# Patient Record
Sex: Male | Born: 1940
Health system: Southern US, Community
[De-identification: ages and names within clinical notes are randomized; demographics above are authoritative.]

## PROBLEM LIST (undated history)

## (undated) DIAGNOSIS — E119 Type 2 diabetes mellitus without complications: Secondary | ICD-10-CM

## (undated) DIAGNOSIS — M1990 Unspecified osteoarthritis, unspecified site: Secondary | ICD-10-CM

## (undated) DIAGNOSIS — E785 Hyperlipidemia, unspecified: Secondary | ICD-10-CM

## (undated) DIAGNOSIS — I10 Essential (primary) hypertension: Secondary | ICD-10-CM

## (undated) DIAGNOSIS — Z86010 Personal history of colonic polyps: Secondary | ICD-10-CM

## (undated) HISTORY — DX: Personal history of colonic polyps: Z86.010

## (undated) HISTORY — PX: COLONOSCOPY: SHX174

## (undated) HISTORY — DX: Type 2 diabetes mellitus without complications: E11.9

## (undated) HISTORY — PX: TRIGGER FINGER RELEASE: SHX641

## (undated) HISTORY — PX: HAND SURGERY: SHX662

## (undated) HISTORY — DX: Hyperlipidemia, unspecified: E78.5

## (undated) HISTORY — DX: Essential (primary) hypertension: I10

## (undated) HISTORY — PX: KNEE ARTHROSCOPY: SUR90

---

## 2006-03-17 ENCOUNTER — Ambulatory Visit: Payer: Self-pay | Admitting: Family Medicine

## 2007-07-20 ENCOUNTER — Ambulatory Visit: Payer: Self-pay | Admitting: Family Medicine

## 2007-07-23 ENCOUNTER — Ambulatory Visit: Payer: Self-pay | Admitting: Internal Medicine

## 2007-07-30 ENCOUNTER — Ambulatory Visit: Payer: Self-pay | Admitting: Internal Medicine

## 2007-07-30 LAB — HM COLONOSCOPY: HM COLON: NORMAL

## 2008-08-21 ENCOUNTER — Ambulatory Visit: Payer: Self-pay | Admitting: Family Medicine

## 2010-04-14 ENCOUNTER — Ambulatory Visit: Payer: Self-pay | Admitting: Family Medicine

## 2010-07-26 ENCOUNTER — Ambulatory Visit: Payer: Self-pay | Admitting: Family Medicine

## 2011-06-04 ENCOUNTER — Other Ambulatory Visit: Payer: Self-pay | Admitting: Family Medicine

## 2011-06-06 NOTE — Telephone Encounter (Signed)
Patient needs a office visit. 

## 2011-08-29 ENCOUNTER — Ambulatory Visit (INDEPENDENT_AMBULATORY_CARE_PROVIDER_SITE_OTHER): Payer: Medicare Other | Admitting: Family Medicine

## 2011-08-29 ENCOUNTER — Encounter: Payer: Self-pay | Admitting: Family Medicine

## 2011-08-29 VITALS — BP 130/90 | HR 60 | Wt 191.0 lb

## 2011-08-29 DIAGNOSIS — B351 Tinea unguium: Secondary | ICD-10-CM

## 2011-08-29 NOTE — Progress Notes (Signed)
  Subjective:    Patient ID: Scott Parks, male    DOB: 25-Nov-1940, 71 y.o.   MRN: [id number]  HPI He is here to get his last vaccine injection. He also has concerns over return of onychomycosis. His right great toe is starting to become discolored.   Review of Systems     Objective:   Physical Exam Alert and in no distress. Exam of the right great toe does show some distal pigment changes.       Assessment & Plan:  Medication update. Early onychomycosis. I will give him a prescription to get Zostavax at [facility] pharmacy. Also discussed treatment with Lamisil and recommended postponing this which he is in agreement with.

## 2011-11-08 ENCOUNTER — Encounter: Payer: Self-pay | Admitting: Family Medicine

## 2011-11-08 ENCOUNTER — Ambulatory Visit (INDEPENDENT_AMBULATORY_CARE_PROVIDER_SITE_OTHER): Payer: Medicare Other | Admitting: Family Medicine

## 2011-11-08 VITALS — BP 140/90 | HR 69 | Ht 69.0 in | Wt 194.0 lb

## 2011-11-08 DIAGNOSIS — Z79899 Other long term (current) drug therapy: Secondary | ICD-10-CM | POA: Diagnosis not present

## 2011-11-08 DIAGNOSIS — E1159 Type 2 diabetes mellitus with other circulatory complications: Secondary | ICD-10-CM | POA: Insufficient documentation

## 2011-11-08 DIAGNOSIS — E785 Hyperlipidemia, unspecified: Secondary | ICD-10-CM

## 2011-11-08 DIAGNOSIS — R972 Elevated prostate specific antigen [PSA]: Secondary | ICD-10-CM | POA: Insufficient documentation

## 2011-11-08 DIAGNOSIS — I10 Essential (primary) hypertension: Secondary | ICD-10-CM | POA: Diagnosis not present

## 2011-11-08 LAB — COMPREHENSIVE METABOLIC PANEL
Alkaline Phosphatase: 114 U/L (ref 39–117)
BUN: 16 mg/dL (ref 6–23)
CO2: 31 mEq/L (ref 19–32)
Creat: 1.07 mg/dL (ref 0.50–1.35)
Glucose, Bld: 102 mg/dL — ABNORMAL HIGH (ref 70–99)
Sodium: 139 mEq/L (ref 135–145)
Total Bilirubin: 0.6 mg/dL (ref 0.3–1.2)
Total Protein: 6.7 g/dL (ref 6.0–8.3)

## 2011-11-08 LAB — LIPID PANEL
Cholesterol: 176 mg/dL (ref 0–200)
HDL: 63 mg/dL (ref >39)
Triglycerides: 58 mg/dL (ref <150)
VLDL: 12 mg/dL (ref 0–40)

## 2011-11-08 LAB — CBC WITH DIFFERENTIAL/PLATELET
Basophils Relative: 0 % (ref 0–1)
Eosinophils Absolute: 0.3 10*3/uL (ref 0.0–0.7)
Eosinophils Relative: 4 % (ref 0–5)
HCT: 46.8 % (ref 39.0–52.0)
Hemoglobin: 15.7 g/dL (ref 13.0–17.0)
Lymphs Abs: 2 10*3/uL (ref 0.7–4.0)
MCH: 27.8 pg (ref 26.0–34.0)
MCHC: 33.5 g/dL (ref 30.0–36.0)
MCV: 82.8 fL (ref 78.0–100.0)
Monocytes Absolute: 0.5 10*3/uL (ref 0.1–1.0)
Monocytes Relative: 7 % (ref 3–12)
RBC: 5.65 MIL/uL (ref 4.22–5.81)

## 2011-11-08 NOTE — Progress Notes (Signed)
  Subjective:    Patient ID: Scott Parks, male    DOB: [birth year], 71 y.o.   MRN: [id number]  HPI He is here for routine checkup. He does have a previous history of elevated PSA. His last testing was in 2008. Last PSA was in the 6 range. He has no particular concerns or complaints. He continues on Lipitor and Accupril as well as omega-3.  Review of Systems     Objective:   Physical Exam alert and in no distress. Tympanic membranes and canals are normal. Throat is clear. Tonsils are normal. Neck is supple without adenopathy or thyromegaly. Cardiac exam shows a regular sinus rhythm without murmurs or gallops. Lungs are clear to auscultation.        Assessment & Plan:   1. Hypertension  CBC with Differential, Comprehensive metabolic panel  2. Elevated PSA  PSA  3. Hyperlipidemia  Lipid panel  4. Encounter for long-term (current) use of other medications  PSA, CBC with Differential, Comprehensive metabolic panel, Lipid panel

## 2011-11-08 NOTE — Patient Instructions (Signed)
Keep taking good care of yourself 

## 2011-11-09 ENCOUNTER — Other Ambulatory Visit: Payer: Self-pay

## 2011-11-09 MED ORDER — ATORVASTATIN CALCIUM 40 MG PO TABS: 40.0000 mg | ORAL_TABLET | Freq: Every day | ORAL | Status: DC

## 2011-11-09 MED ORDER — QUINAPRIL HCL 40 MG PO TABS: 40.0000 mg | ORAL_TABLET | Freq: Every day | ORAL | Status: DC

## 2011-11-09 NOTE — Telephone Encounter (Signed)
Sent med in per pt 

## 2011-11-10 ENCOUNTER — Telehealth: Payer: Self-pay | Admitting: Internal Medicine

## 2011-11-10 MED ORDER — ATORVASTATIN CALCIUM 40 MG PO TABS: 40.0000 mg | ORAL_TABLET | Freq: Every day | ORAL | Status: DC

## 2011-11-10 MED ORDER — QUINAPRIL HCL 40 MG PO TABS: 40.0000 mg | ORAL_TABLET | Freq: Every day | ORAL | Status: DC

## 2011-11-10 NOTE — Telephone Encounter (Signed)
#  30 with 5 was done for meds atorvastatin 40mg  and quinapril by shane on 11/09/11. Got a fax for it to be a 90 day supply for [facility] so i changed both meds to #90 with 1 refill to equal what shane put in yesterday as a refill.

## 2011-12-19 ENCOUNTER — Ambulatory Visit (INDEPENDENT_AMBULATORY_CARE_PROVIDER_SITE_OTHER): Payer: Medicare Other | Admitting: Family Medicine

## 2011-12-19 ENCOUNTER — Encounter: Payer: Self-pay | Admitting: Family Medicine

## 2011-12-19 VITALS — BP 130/88 | HR 65 | Temp 97.6°F | Wt 199.0 lb

## 2011-12-19 DIAGNOSIS — H6690 Otitis media, unspecified, unspecified ear: Secondary | ICD-10-CM

## 2011-12-19 DIAGNOSIS — H6691 Otitis media, unspecified, right ear: Secondary | ICD-10-CM

## 2011-12-19 MED ORDER — AMOXICILLIN 875 MG PO TABS: 875.0000 mg | ORAL_TABLET | Freq: Two times a day (BID) | ORAL | Status: AC

## 2011-12-19 NOTE — Progress Notes (Signed)
  Subjective:    Patient ID: Scott Parks, male    DOB: November 20, 1940, 71 y.o.   MRN: [id number]  HPI He has a 9 day history of sore throat and right earache. No fever or chills, nasal congestion, PND , he does have nighttime coughing in particular. He has no allergies and does not smoke Review of Systems     Objective:   Physical Exam alert and in no distress. Tympanic membrane on the right is dull and vascular, left is normal, canals are normal. Throat is clear. Tonsils are normal. Neck is supple without adenopathy or thyromegaly. Cardiac exam shows a regular sinus rhythm without murmurs or gallops. Lungs are clear to auscultation.       Assessment & Plan:   1. Acute right otitis media  amoxicillin (AMOXIL) 875 MG tablet

## 2012-07-18 DIAGNOSIS — Z23 Encounter for immunization: Secondary | ICD-10-CM | POA: Diagnosis not present

## 2012-08-22 ENCOUNTER — Telehealth: Payer: Self-pay | Admitting: Internal Medicine

## 2012-08-22 MED ORDER — QUINAPRIL HCL 40 MG PO TABS: 40.0000 mg | ORAL_TABLET | Freq: Every day | ORAL | Status: DC

## 2012-08-22 NOTE — Telephone Encounter (Signed)
Pt has one day left on his accuapril and pt is coming in on Tuesday January 14 for a med check and needed a refill sent to pharmacy. i sent in 30 day supply to pharmacy so pt wont run out

## 2012-08-28 ENCOUNTER — Encounter: Payer: Self-pay | Admitting: Family Medicine

## 2012-08-28 ENCOUNTER — Other Ambulatory Visit: Payer: Self-pay | Admitting: Family Medicine

## 2012-08-28 ENCOUNTER — Ambulatory Visit (INDEPENDENT_AMBULATORY_CARE_PROVIDER_SITE_OTHER): Payer: Medicare Other | Admitting: Family Medicine

## 2012-08-28 VITALS — BP 120/74 | HR 64 | Wt 191.0 lb

## 2012-08-28 DIAGNOSIS — N529 Male erectile dysfunction, unspecified: Secondary | ICD-10-CM | POA: Diagnosis not present

## 2012-08-28 DIAGNOSIS — Z1211 Encounter for screening for malignant neoplasm of colon: Secondary | ICD-10-CM

## 2012-08-28 DIAGNOSIS — I10 Essential (primary) hypertension: Secondary | ICD-10-CM | POA: Diagnosis not present

## 2012-08-28 DIAGNOSIS — Z79899 Other long term (current) drug therapy: Secondary | ICD-10-CM | POA: Diagnosis not present

## 2012-08-28 DIAGNOSIS — E785 Hyperlipidemia, unspecified: Secondary | ICD-10-CM | POA: Diagnosis not present

## 2012-08-28 DIAGNOSIS — R972 Elevated prostate specific antigen [PSA]: Secondary | ICD-10-CM

## 2012-08-28 LAB — HEMOCCULT GUIAC POC 1CARD (OFFICE)

## 2012-08-28 LAB — CBC WITH DIFFERENTIAL/PLATELET
Basophils Absolute: 0 10*3/uL (ref 0.0–0.1)
Basophils Relative: 0 % (ref 0–1)
Eosinophils Absolute: 0.2 10*3/uL (ref 0.0–0.7)
HCT: 43.2 % (ref 39.0–52.0)
Hemoglobin: 14.7 g/dL (ref 13.0–17.0)
MCH: 27.6 pg (ref 26.0–34.0)
MCHC: 34 g/dL (ref 30.0–36.0)
Monocytes Absolute: 0.3 10*3/uL (ref 0.1–1.0)
Monocytes Relative: 5 % (ref 3–12)
RDW: 14 % (ref 11.5–15.5)

## 2012-08-28 MED ORDER — QUINAPRIL HCL 40 MG PO TABS: 40.0000 mg | ORAL_TABLET | Freq: Every day | ORAL | Status: DC

## 2012-08-28 MED ORDER — TADALAFIL 20 MG PO TABS: 20.0000 mg | ORAL_TABLET | Freq: Every day | ORAL | Status: DC | PRN

## 2012-08-28 MED ORDER — ATORVASTATIN CALCIUM 40 MG PO TABS: 40.0000 mg | ORAL_TABLET | Freq: Every day | ORAL | Status: DC

## 2012-08-28 NOTE — Progress Notes (Signed)
  Subjective:    Patient ID: Scott Parks, male    DOB: Dec 01, 1940, 72 y.o.   MRN: [id number]  HPI He is here for an interval evaluation. He does have a previous history of elevated PSA with previously negative biopsies. His PSA has been stable. He will need followup on this. He continues on his blood pressure medication and is having no difficulty with this. He also takes Lipitor. He has found that all the ED medications essentially work the same. His immunizations were reviewed and are up to date. He has no other concerns or complaints.   Review of Systems     Objective:   Physical Exam alert and in no distress. Tympanic membranes and canals are normal. Throat is clear. Tonsils are normal. Neck is supple without adenopathy or thyromegaly. Cardiac exam shows a regular sinus rhythm without murmurs or gallops. Lungs are clear to auscultation. Rectal exam shows a slightly large prostate with no nodules. Stool is guaiac negative       Assessment & Plan:   1. Elevated PSA  PSA Total+%Free (Serial)  2. Hypertension  quinapril (ACCUPRIL) 40 MG tablet, CBC with Differential, Comprehensive metabolic panel  3. Hyperlipidemia  atorvastatin (LIPITOR) 40 MG tablet, Lipid panel  4. ED (erectile dysfunction)  tadalafil (CIALIS) 20 MG tablet  5. Encounter for long-term (current) use of other medications  Lipid panel, CBC with Differential, Comprehensive metabolic panel  6. Screening for colon cancer  Hemoccult - 1 Card (office)

## 2012-08-29 LAB — LIPID PANEL
Cholesterol: 165 mg/dL (ref 0–200)
HDL: 59 mg/dL (ref >39)
Triglycerides: 71 mg/dL (ref <150)
VLDL: 14 mg/dL (ref 0–40)

## 2012-08-29 LAB — COMPREHENSIVE METABOLIC PANEL
Alkaline Phosphatase: 98 U/L (ref 39–117)
BUN: 18 mg/dL (ref 6–23)
Creat: 1.16 mg/dL (ref 0.50–1.35)
Glucose, Bld: 122 mg/dL — ABNORMAL HIGH (ref 70–99)
Total Bilirubin: 0.7 mg/dL (ref 0.3–1.2)

## 2012-09-26 DIAGNOSIS — R972 Elevated prostate specific antigen [PSA]: Secondary | ICD-10-CM | POA: Diagnosis not present

## 2012-09-26 DIAGNOSIS — N400 Enlarged prostate without lower urinary tract symptoms: Secondary | ICD-10-CM | POA: Diagnosis not present

## 2013-10-02 DIAGNOSIS — N400 Enlarged prostate without lower urinary tract symptoms: Secondary | ICD-10-CM | POA: Diagnosis not present

## 2013-10-02 DIAGNOSIS — R972 Elevated prostate specific antigen [PSA]: Secondary | ICD-10-CM | POA: Diagnosis not present

## 2013-10-02 DIAGNOSIS — N529 Male erectile dysfunction, unspecified: Secondary | ICD-10-CM | POA: Diagnosis not present

## 2013-10-22 ENCOUNTER — Ambulatory Visit (INDEPENDENT_AMBULATORY_CARE_PROVIDER_SITE_OTHER): Payer: Medicare Other | Admitting: Family Medicine

## 2013-10-22 ENCOUNTER — Encounter: Payer: Self-pay | Admitting: Family Medicine

## 2013-10-22 ENCOUNTER — Other Ambulatory Visit: Payer: Self-pay | Admitting: Family Medicine

## 2013-10-22 VITALS — BP 140/72 | HR 60 | Ht 69.0 in | Wt 186.0 lb

## 2013-10-22 DIAGNOSIS — R972 Elevated prostate specific antigen [PSA]: Secondary | ICD-10-CM

## 2013-10-22 DIAGNOSIS — I10 Essential (primary) hypertension: Secondary | ICD-10-CM

## 2013-10-22 DIAGNOSIS — E785 Hyperlipidemia, unspecified: Secondary | ICD-10-CM | POA: Diagnosis not present

## 2013-10-22 DIAGNOSIS — Z79899 Other long term (current) drug therapy: Secondary | ICD-10-CM

## 2013-10-22 DIAGNOSIS — Z125 Encounter for screening for malignant neoplasm of prostate: Secondary | ICD-10-CM | POA: Diagnosis not present

## 2013-10-22 LAB — COMPREHENSIVE METABOLIC PANEL
ALBUMIN: 4.2 g/dL (ref 3.5–5.2)
ALK PHOS: 102 U/L (ref 39–117)
ALT: 17 U/L (ref 0–53)
AST: 16 U/L (ref 0–37)
BILIRUBIN TOTAL: 0.6 mg/dL (ref 0.2–1.2)
BUN: 18 mg/dL (ref 6–23)
CO2: 31 mEq/L (ref 19–32)
Calcium: 9.7 mg/dL (ref 8.4–10.5)
Chloride: 102 mEq/L (ref 96–112)
Creat: 1.16 mg/dL (ref 0.50–1.35)
Glucose, Bld: 141 mg/dL — ABNORMAL HIGH (ref 70–99)
POTASSIUM: 4.6 meq/L (ref 3.5–5.3)
SODIUM: 138 meq/L (ref 135–145)
TOTAL PROTEIN: 6.9 g/dL (ref 6.0–8.3)

## 2013-10-22 LAB — CBC WITH DIFFERENTIAL/PLATELET
BASOS ABS: 0 10*3/uL (ref 0.0–0.1)
BASOS PCT: 0 % (ref 0–1)
Eosinophils Absolute: 0.3 10*3/uL (ref 0.0–0.7)
Eosinophils Relative: 4 % (ref 0–5)
HCT: 43.2 % (ref 39.0–52.0)
HEMOGLOBIN: 15 g/dL (ref 13.0–17.0)
Lymphocytes Relative: 29 % (ref 12–46)
Lymphs Abs: 2 10*3/uL (ref 0.7–4.0)
MCH: 27.8 pg (ref 26.0–34.0)
MCHC: 34.7 g/dL (ref 30.0–36.0)
MCV: 80.1 fL (ref 78.0–100.0)
Monocytes Absolute: 0.5 10*3/uL (ref 0.1–1.0)
Monocytes Relative: 7 % (ref 3–12)
NEUTROS ABS: 4.1 10*3/uL (ref 1.7–7.7)
NEUTROS PCT: 60 % (ref 43–77)
PLATELETS: 181 10*3/uL (ref 150–400)
RBC: 5.39 MIL/uL (ref 4.22–5.81)
RDW: 15.3 % (ref 11.5–15.5)
WBC: 6.9 10*3/uL (ref 4.0–10.5)

## 2013-10-22 LAB — LIPID PANEL
Cholesterol: 179 mg/dL (ref 0–200)
HDL: 65 mg/dL (ref >39)
LDL CALC: 100 mg/dL — AB (ref 0–99)
Total CHOL/HDL Ratio: 2.8 Ratio
Triglycerides: 72 mg/dL (ref <150)
VLDL: 14 mg/dL (ref 0–40)

## 2013-10-22 MED ORDER — QUINAPRIL HCL 40 MG PO TABS
40.0000 mg | ORAL_TABLET | Freq: Every day | ORAL | Status: DC
Start: 2013-10-22 — End: 2014-12-17

## 2013-10-22 MED ORDER — ATORVASTATIN CALCIUM 40 MG PO TABS
40.0000 mg | ORAL_TABLET | Freq: Every day | ORAL | Status: DC
Start: 2013-10-22 — End: 2014-12-17

## 2013-10-22 NOTE — Progress Notes (Signed)
   Subjective:    Patient ID: Scott Parks, male    DOB: 1940/11/11, 73 y.o.   MRN: [id number]  HPI He is here for a medication check. He does have a long history of elevated PSA and has been followed serially with PSA and total. He also is on Lipitor and doing well on this. He continues on his Accupril and is having no difficulty. He exercises regularly. He does note that he has some hip discomfort however exercise makes the pain go away. Social history was reviewed; he does not smoke and rarely drinks. His marriage is going well. He has been retired for 6 months and enjoying this. He does Psychologist, occupational with various organizations. He had a colonoscopy 5 years ago. His immunizations were reviewed and are apparently normal  Review of Systems  All other systems reviewed and are negative.       Objective:   Physical Exam BP 140/72  Pulse 60  Ht 5\' 9"  (1.753 m)  Wt 186 lb (84.369 kg)  BMI 27.45 kg/m2  General Appearance:    Alert, cooperative, no distress, appears stated age  Head:    Normocephalic, without obvious abnormality, atraumatic  Eyes:    PERRL, conjunctiva/corneas clear, EOM's intact, fundi    benign  Ears:    Normal TM's and external ear canals  Nose:   Nares normal, mucosa normal, no drainage or sinus   tenderness  Throat:   Lips, mucosa, and tongue normal; teeth and gums normal  Neck:   Supple, no lymphadenopathy;  thyroid:  no   enlargement/tenderness/nodules; no carotid   bruit or JVD  Back:    Spine nontender, no curvature, ROM normal, no CVA     tenderness  Lungs:     Clear to auscultation bilaterally without wheezes, rales or     ronchi; respirations unlabored  Chest Wall:    No tenderness or deformity   Heart:    Regular rate and rhythm, S1 and S2 normal, no murmur, rub   or gallop  Breast Exam:    No chest wall tenderness, masses or gynecomastia  Abdomen:     Soft, non-tender, nondistended, normoactive bowel sounds,    no masses, no hepatosplenomegaly          Extremities:   No clubbing, cyanosis or edema  Pulses:   2+ and symmetric all extremities  Skin:   Skin color, texture, turgor normal, no rashes or lesions  Lymph nodes:   Cervical, supraclavicular, and axillary nodes normal  Neurologic:   CNII-XII intact, normal strength, sensation and gait; reflexes 2+ and symmetric throughout          Psych:   Normal mood, affect, hygiene and grooming.            Assessment & Plan:  Elevated PSA - Plan: PSA, total and free  Hyperlipidemia - Plan: Lipid panel, atorvastatin (LIPITOR) 40 MG tablet  Hypertension - Plan: CBC with Differential, Comprehensive metabolic panel, quinapril (ACCUPRIL) 40 MG tablet  Encounter for long-term (current) use of other medications - Plan: CBC with Differential, Comprehensive metabolic panel, Lipid panel, PSA, total and free  Special screening for malignant neoplasm of prostate - Plan: PSA, total and free  urged him to continue to take good care of himself.

## 2013-10-23 LAB — HEMOGLOBIN A1C
Hgb A1c MFr Bld: 7.3 % — ABNORMAL HIGH (ref <5.7)
Mean Plasma Glucose: 163 mg/dL — ABNORMAL HIGH (ref <117)

## 2013-10-23 LAB — PSA, TOTAL AND FREE
PSA FREE PCT: 25 % (ref >25)
PSA, Free: 2.05 ng/mL
PSA: 8.26 ng/mL — AB (ref <=4.00)

## 2013-10-30 ENCOUNTER — Ambulatory Visit (INDEPENDENT_AMBULATORY_CARE_PROVIDER_SITE_OTHER): Payer: Medicare Other | Admitting: Family Medicine

## 2013-10-30 ENCOUNTER — Encounter: Payer: Self-pay | Admitting: Family Medicine

## 2013-10-30 VITALS — BP 110/72 | HR 64 | Wt 186.0 lb

## 2013-10-30 DIAGNOSIS — E119 Type 2 diabetes mellitus without complications: Secondary | ICD-10-CM

## 2013-10-30 NOTE — Progress Notes (Signed)
   Subjective:    Patient ID: Scott Parks, male    DOB: 1941/05/10, 73 y.o.   MRN: [id number]  HPI He is here for consult concerning recent diagnosis of diabetes. Recently an A1c was done which was 7.3.  Review of Systems     Objective:   Physical Exam Alert and in no distress otherwise not examined       Assessment & Plan:  Diabetes mellitus, new onset - Plan: Amb Referral to Nutrition and Diabetic [facility]  I discussed the diagnosis of diabetes in regard to spectrum of disease. Discussed diet, exercise, foot care, eye care, blood pressure and cholesterol. His wife was recently diagnosed with diabetes. He was given information concerning going to the American diabetes association website or [email or web address] website for more information. I will check him back here in one month. He was also given a glucometer with instructions on proper use. Over 30 minutes spent discussing all these issues with him.

## 2013-10-30 NOTE — Patient Instructions (Signed)
Go to the American diabetes association website or [email or web address] 

## 2013-11-01 ENCOUNTER — Other Ambulatory Visit: Payer: Self-pay | Admitting: Family Medicine

## 2013-11-01 ENCOUNTER — Telehealth: Payer: Self-pay | Admitting: Family Medicine

## 2013-11-01 MED ORDER — GLUCOSE BLOOD VI STRP: ORAL_STRIP | Status: DC

## 2013-11-01 NOTE — Telephone Encounter (Signed)
RX REFILLS SENT TO HIS PHARMACY ON HIS TEST STRIPS AND LANCETS.Marland Kitchen CLS

## 2013-11-06 ENCOUNTER — Telehealth: Payer: Self-pay | Admitting: Internal Medicine

## 2013-11-06 MED ORDER — GLUCOSE BLOOD VI STRP: ORAL_STRIP | Status: DC

## 2013-11-06 NOTE — Telephone Encounter (Signed)
Pt called and said test strips and lancets weren't at

## 2013-11-13 ENCOUNTER — Encounter: Payer: Medicare Other | Attending: Family Medicine

## 2013-11-13 VITALS — Ht 69.0 in | Wt 185.4 lb

## 2013-11-13 DIAGNOSIS — Z713 Dietary counseling and surveillance: Secondary | ICD-10-CM | POA: Diagnosis not present

## 2013-11-13 DIAGNOSIS — E119 Type 2 diabetes mellitus without complications: Secondary | ICD-10-CM | POA: Diagnosis not present

## 2013-11-13 NOTE — Progress Notes (Signed)
Patient was seen on 11/13/13 for the first of a series of three diabetes self-management courses at [facility].  Current HbA1c: 7.3%  The following learning objectives were met by the patient during this class:  Describe diabetes  State some common risk factors for diabetes  Defines the role of glucose and insulin  Identifies type of diabetes and pathophysiology  Describe the relationship between diabetes and cardiovascular risk  State the members of the Healthcare Team  States the rationale for glucose monitoring  State when to test glucose  State their individual Target Range  State the importance of logging glucose readings  Describe how to interpret glucose readings  Identifies A1C target  Explain the correlation between A1c and eAG values  State symptoms and treatment of high blood glucose  State symptoms and treatment of low blood glucose  Explain proper technique for glucose testing  Identifies proper sharps disposal  Handouts given during class include:  Living Well with Diabetes book  Carb Counting and Meal Planning book  Meal Plan Card  Carbohydrate guide  Meal planning worksheet  Low Sodium Flavoring Tips  The diabetes portion plate  K3T to eAG Conversion Chart  Diabetes Medications  Diabetes Recommended Care Schedule  Support Group  Diabetes Success Plan  Core Class Satisfaction Survey  Follow-Up Plan:  Attend core 2

## 2013-11-20 DIAGNOSIS — E119 Type 2 diabetes mellitus without complications: Secondary | ICD-10-CM | POA: Diagnosis not present

## 2013-11-20 DIAGNOSIS — Z713 Dietary counseling and surveillance: Secondary | ICD-10-CM | POA: Diagnosis not present

## 2013-11-21 NOTE — Progress Notes (Signed)

## 2013-11-27 DIAGNOSIS — E119 Type 2 diabetes mellitus without complications: Secondary | ICD-10-CM | POA: Diagnosis not present

## 2013-11-27 DIAGNOSIS — Z713 Dietary counseling and surveillance: Secondary | ICD-10-CM | POA: Diagnosis not present

## 2013-11-27 NOTE — Progress Notes (Signed)
Patient was seen on 11/27/2013 for the third of a series of three diabetes self-management courses at the [facility]. The following learning objectives were met by the patient during this class:    State the amount of activity recommended for healthy living   Describe activities suitable for individual needs   Identify ways to regularly incorporate activity into daily life   Identify barriers to activity and ways to over come these barriers  Identify diabetes medications being personally used and their primary action for lowering glucose and possible side effects   Describe role of stress on blood glucose and develop strategies to address psychosocial issues   Identify diabetes complications and ways to prevent them  Explain how to manage diabetes during illness   Evaluate success in meeting personal goal   Establish 2-3 goals that they will plan to diligently work on until they return for the  3-monthfollow-up visit  Goals:  Follow Diabetes Meal Plan as instructed  Aim for 15-30 mins of physical activity daily as tolerated  Bring food record and glucose log to your follow up visit  Your patient has established the following 4 month goals in their individualized success plan:  Reduce fat in my diet by eating less fried foods at two or meals a day  Watch what I eat and when  Test my glucose at least 2 x a day  Your patient has identified these potential barriers to change:  None Your patient has identified their diabetes self-care support plan as  [facility]  Wife  Plan:  Attend Core 4 in 4 months

## 2013-12-04 ENCOUNTER — Ambulatory Visit (INDEPENDENT_AMBULATORY_CARE_PROVIDER_SITE_OTHER): Payer: Medicare Other | Admitting: Family Medicine

## 2013-12-04 ENCOUNTER — Encounter: Payer: Self-pay | Admitting: Family Medicine

## 2013-12-04 VITALS — BP 120/74 | HR 54 | Wt 185.0 lb

## 2013-12-04 DIAGNOSIS — E119 Type 2 diabetes mellitus without complications: Secondary | ICD-10-CM

## 2013-12-04 NOTE — Progress Notes (Signed)
   Subjective:    Patient ID: Scott Parks, male    DOB: Nov 04, 1940, 73 y.o.   MRN: [id number]  HPI Is here for recheck. He was diagnosed with diabetes approximately one month ago. He has been to the nutrition and diabetes education program. He found this to be quite useful. He has made some diet and exercise changes. He has been checking his blood sugar twice per day and states his average around 120. He did go to 2 different websites to also get information. He is quite motivated to continue to take good care of himself to diminish the need for further medications.   Review of Systems     Objective:   Physical Exam Alert and in no distress otherwise not examined       Assessment & Plan:  Diabetes mellitus, new onset  I encouraged him to continue with his present lifestyle. Recheck here in 3 months.

## 2014-01-27 DIAGNOSIS — Z0000 Encounter for general adult medical examination without abnormal findings: Secondary | ICD-10-CM | POA: Diagnosis not present

## 2014-01-27 DIAGNOSIS — E559 Vitamin D deficiency, unspecified: Secondary | ICD-10-CM | POA: Diagnosis not present

## 2014-01-27 DIAGNOSIS — E7800 Pure hypercholesterolemia, unspecified: Secondary | ICD-10-CM | POA: Diagnosis not present

## 2014-01-27 DIAGNOSIS — Z125 Encounter for screening for malignant neoplasm of prostate: Secondary | ICD-10-CM | POA: Diagnosis not present

## 2014-01-27 DIAGNOSIS — I10 Essential (primary) hypertension: Secondary | ICD-10-CM | POA: Diagnosis not present

## 2014-02-27 ENCOUNTER — Ambulatory Visit: Payer: Self-pay | Admitting: Family Medicine

## 2014-02-28 ENCOUNTER — Encounter: Payer: Self-pay | Admitting: Internal Medicine

## 2014-03-06 ENCOUNTER — Ambulatory Visit: Payer: Self-pay | Admitting: Family Medicine

## 2014-03-17 ENCOUNTER — Encounter: Payer: Medicare Other | Attending: Family Medicine

## 2014-03-17 DIAGNOSIS — Z713 Dietary counseling and surveillance: Secondary | ICD-10-CM | POA: Diagnosis not present

## 2014-03-17 DIAGNOSIS — E119 Type 2 diabetes mellitus without complications: Secondary | ICD-10-CM | POA: Diagnosis not present

## 2014-03-17 NOTE — Patient Instructions (Signed)
Goals:  Follow Diabetes Meal Plan as instructed  Eat 3 meals and 2 snacks, every 3-5 hrs  Limit carbohydrate intake to 45 grams carbohydrate/meal Limit carbohydrate intake to 15 grams carbohydrate/snack Add lean protein foods to meals/snacks  Monitor glucose levels as instructed by your doctor  Aim for goal of 15-30 mins of physical activity daily as tolerated  Bring food record and glucose log to your next nutrition visit 

## 2014-03-17 NOTE — Progress Notes (Signed)
Patient was seen on 03/17/2014 for a review of the series of three diabetes self-management courses at the [facility]. The following learning objectives were met by the patient during this class:    Reviewed blood glucose monitoring and interpretation including the recommended target ranges and Hgb A1c.    Reviewed on carb counting, importance of regularly scheduled meals/snacks, and meal planning.    Reviewed the effects of physical activity on glucose levels and long-term glucose control.  Recommended goal of 150 minutes of physical activity/week.   Reviewed patient medications and discussed role of medication on blood glucose and possible side effects.   Discussed strategies to manage stress, psychosocial issues, and other obstacles to diabetes management.   Encouraged moderate weight reduction to improve glucose levels.     Reviewed short-term complications: hyper- and hypo-glycemia.  Discussed causes, symptoms, and treatment options.   Reviewed prevention, detection, and treatment of long-term complications.  Discussed the role of prolonged elevated glucose levels on body systems.  Goals:  Follow Diabetes Meal Plan as instructed  Eat 3 meals and 2 snacks, every 3-5 hrs  Limit carbohydrate intake to 45 grams carbohydrate/meal Limit carbohydrate intake to 15 grams carbohydrate/snack Add lean protein foods to meals/snacks  Monitor glucose levels as instructed by your doctor  Aim for goal of 15-30 mins of physical activity daily as tolerated  Bring food record and glucose log to your next nutrition visit   

## 2014-05-15 DIAGNOSIS — Z23 Encounter for immunization: Secondary | ICD-10-CM | POA: Diagnosis not present

## 2014-07-21 ENCOUNTER — Other Ambulatory Visit: Payer: Self-pay

## 2014-10-03 DIAGNOSIS — R972 Elevated prostate specific antigen [PSA]: Secondary | ICD-10-CM | POA: Diagnosis not present

## 2014-10-03 DIAGNOSIS — N400 Enlarged prostate without lower urinary tract symptoms: Secondary | ICD-10-CM | POA: Diagnosis not present

## 2014-11-19 DIAGNOSIS — M542 Cervicalgia: Secondary | ICD-10-CM | POA: Diagnosis not present

## 2014-11-19 DIAGNOSIS — M791 Myalgia: Secondary | ICD-10-CM | POA: Diagnosis not present

## 2014-11-19 DIAGNOSIS — M461 Sacroiliitis, not elsewhere classified: Secondary | ICD-10-CM | POA: Diagnosis not present

## 2014-11-19 DIAGNOSIS — M545 Low back pain: Secondary | ICD-10-CM | POA: Diagnosis not present

## 2014-11-19 DIAGNOSIS — M25551 Pain in right hip: Secondary | ICD-10-CM | POA: Diagnosis not present

## 2014-11-19 DIAGNOSIS — M5137 Other intervertebral disc degeneration, lumbosacral region: Secondary | ICD-10-CM | POA: Diagnosis not present

## 2014-11-19 DIAGNOSIS — M5417 Radiculopathy, lumbosacral region: Secondary | ICD-10-CM | POA: Diagnosis not present

## 2014-11-21 DIAGNOSIS — M5126 Other intervertebral disc displacement, lumbar region: Secondary | ICD-10-CM | POA: Diagnosis not present

## 2014-11-21 DIAGNOSIS — M5417 Radiculopathy, lumbosacral region: Secondary | ICD-10-CM | POA: Diagnosis not present

## 2014-11-21 DIAGNOSIS — M5127 Other intervertebral disc displacement, lumbosacral region: Secondary | ICD-10-CM | POA: Diagnosis not present

## 2014-11-21 DIAGNOSIS — M5410 Radiculopathy, site unspecified: Secondary | ICD-10-CM | POA: Diagnosis not present

## 2014-11-21 DIAGNOSIS — M9901 Segmental and somatic dysfunction of cervical region: Secondary | ICD-10-CM | POA: Diagnosis not present

## 2014-11-21 DIAGNOSIS — M461 Sacroiliitis, not elsewhere classified: Secondary | ICD-10-CM | POA: Diagnosis not present

## 2014-11-21 DIAGNOSIS — M545 Low back pain: Secondary | ICD-10-CM | POA: Diagnosis not present

## 2014-11-21 DIAGNOSIS — M9903 Segmental and somatic dysfunction of lumbar region: Secondary | ICD-10-CM | POA: Diagnosis not present

## 2014-11-21 DIAGNOSIS — M5137 Other intervertebral disc degeneration, lumbosacral region: Secondary | ICD-10-CM | POA: Diagnosis not present

## 2014-11-21 DIAGNOSIS — M791 Myalgia: Secondary | ICD-10-CM | POA: Diagnosis not present

## 2014-11-21 DIAGNOSIS — M542 Cervicalgia: Secondary | ICD-10-CM | POA: Diagnosis not present

## 2014-11-24 DIAGNOSIS — M461 Sacroiliitis, not elsewhere classified: Secondary | ICD-10-CM | POA: Diagnosis not present

## 2014-11-24 DIAGNOSIS — M542 Cervicalgia: Secondary | ICD-10-CM | POA: Diagnosis not present

## 2014-11-24 DIAGNOSIS — M5126 Other intervertebral disc displacement, lumbar region: Secondary | ICD-10-CM | POA: Diagnosis not present

## 2014-11-24 DIAGNOSIS — M5410 Radiculopathy, site unspecified: Secondary | ICD-10-CM | POA: Diagnosis not present

## 2014-11-24 DIAGNOSIS — M9903 Segmental and somatic dysfunction of lumbar region: Secondary | ICD-10-CM | POA: Diagnosis not present

## 2014-11-24 DIAGNOSIS — M5137 Other intervertebral disc degeneration, lumbosacral region: Secondary | ICD-10-CM | POA: Diagnosis not present

## 2014-11-24 DIAGNOSIS — M5417 Radiculopathy, lumbosacral region: Secondary | ICD-10-CM | POA: Diagnosis not present

## 2014-11-24 DIAGNOSIS — M5127 Other intervertebral disc displacement, lumbosacral region: Secondary | ICD-10-CM | POA: Diagnosis not present

## 2014-11-24 DIAGNOSIS — M545 Low back pain: Secondary | ICD-10-CM | POA: Diagnosis not present

## 2014-11-24 DIAGNOSIS — M791 Myalgia: Secondary | ICD-10-CM | POA: Diagnosis not present

## 2014-11-24 DIAGNOSIS — M9901 Segmental and somatic dysfunction of cervical region: Secondary | ICD-10-CM | POA: Diagnosis not present

## 2014-11-25 DIAGNOSIS — M9903 Segmental and somatic dysfunction of lumbar region: Secondary | ICD-10-CM | POA: Diagnosis not present

## 2014-11-25 DIAGNOSIS — M9901 Segmental and somatic dysfunction of cervical region: Secondary | ICD-10-CM | POA: Diagnosis not present

## 2014-11-25 DIAGNOSIS — M542 Cervicalgia: Secondary | ICD-10-CM | POA: Diagnosis not present

## 2014-11-25 DIAGNOSIS — M791 Myalgia: Secondary | ICD-10-CM | POA: Diagnosis not present

## 2014-11-25 DIAGNOSIS — M545 Low back pain: Secondary | ICD-10-CM | POA: Diagnosis not present

## 2014-11-25 DIAGNOSIS — M5137 Other intervertebral disc degeneration, lumbosacral region: Secondary | ICD-10-CM | POA: Diagnosis not present

## 2014-11-25 DIAGNOSIS — M5417 Radiculopathy, lumbosacral region: Secondary | ICD-10-CM | POA: Diagnosis not present

## 2014-11-25 DIAGNOSIS — M5126 Other intervertebral disc displacement, lumbar region: Secondary | ICD-10-CM | POA: Diagnosis not present

## 2014-11-25 DIAGNOSIS — M461 Sacroiliitis, not elsewhere classified: Secondary | ICD-10-CM | POA: Diagnosis not present

## 2014-11-25 DIAGNOSIS — M5127 Other intervertebral disc displacement, lumbosacral region: Secondary | ICD-10-CM | POA: Diagnosis not present

## 2014-11-25 DIAGNOSIS — M5410 Radiculopathy, site unspecified: Secondary | ICD-10-CM | POA: Diagnosis not present

## 2014-11-26 DIAGNOSIS — M545 Low back pain: Secondary | ICD-10-CM | POA: Diagnosis not present

## 2014-11-26 DIAGNOSIS — M5137 Other intervertebral disc degeneration, lumbosacral region: Secondary | ICD-10-CM | POA: Diagnosis not present

## 2014-11-26 DIAGNOSIS — M9901 Segmental and somatic dysfunction of cervical region: Secondary | ICD-10-CM | POA: Diagnosis not present

## 2014-11-26 DIAGNOSIS — M791 Myalgia: Secondary | ICD-10-CM | POA: Diagnosis not present

## 2014-11-26 DIAGNOSIS — M5417 Radiculopathy, lumbosacral region: Secondary | ICD-10-CM | POA: Diagnosis not present

## 2014-11-26 DIAGNOSIS — M5410 Radiculopathy, site unspecified: Secondary | ICD-10-CM | POA: Diagnosis not present

## 2014-11-26 DIAGNOSIS — M5127 Other intervertebral disc displacement, lumbosacral region: Secondary | ICD-10-CM | POA: Diagnosis not present

## 2014-11-26 DIAGNOSIS — M542 Cervicalgia: Secondary | ICD-10-CM | POA: Diagnosis not present

## 2014-11-26 DIAGNOSIS — M461 Sacroiliitis, not elsewhere classified: Secondary | ICD-10-CM | POA: Diagnosis not present

## 2014-11-26 DIAGNOSIS — M9903 Segmental and somatic dysfunction of lumbar region: Secondary | ICD-10-CM | POA: Diagnosis not present

## 2014-11-26 DIAGNOSIS — M5126 Other intervertebral disc displacement, lumbar region: Secondary | ICD-10-CM | POA: Diagnosis not present

## 2014-12-01 DIAGNOSIS — M791 Myalgia: Secondary | ICD-10-CM | POA: Diagnosis not present

## 2014-12-01 DIAGNOSIS — M9901 Segmental and somatic dysfunction of cervical region: Secondary | ICD-10-CM | POA: Diagnosis not present

## 2014-12-01 DIAGNOSIS — M5417 Radiculopathy, lumbosacral region: Secondary | ICD-10-CM | POA: Diagnosis not present

## 2014-12-01 DIAGNOSIS — M9903 Segmental and somatic dysfunction of lumbar region: Secondary | ICD-10-CM | POA: Diagnosis not present

## 2014-12-01 DIAGNOSIS — M545 Low back pain: Secondary | ICD-10-CM | POA: Diagnosis not present

## 2014-12-01 DIAGNOSIS — M5126 Other intervertebral disc displacement, lumbar region: Secondary | ICD-10-CM | POA: Diagnosis not present

## 2014-12-01 DIAGNOSIS — M542 Cervicalgia: Secondary | ICD-10-CM | POA: Diagnosis not present

## 2014-12-01 DIAGNOSIS — M461 Sacroiliitis, not elsewhere classified: Secondary | ICD-10-CM | POA: Diagnosis not present

## 2014-12-01 DIAGNOSIS — M5410 Radiculopathy, site unspecified: Secondary | ICD-10-CM | POA: Diagnosis not present

## 2014-12-01 DIAGNOSIS — M5127 Other intervertebral disc displacement, lumbosacral region: Secondary | ICD-10-CM | POA: Diagnosis not present

## 2014-12-01 DIAGNOSIS — M5137 Other intervertebral disc degeneration, lumbosacral region: Secondary | ICD-10-CM | POA: Diagnosis not present

## 2014-12-03 DIAGNOSIS — M5127 Other intervertebral disc displacement, lumbosacral region: Secondary | ICD-10-CM | POA: Diagnosis not present

## 2014-12-03 DIAGNOSIS — M461 Sacroiliitis, not elsewhere classified: Secondary | ICD-10-CM | POA: Diagnosis not present

## 2014-12-03 DIAGNOSIS — M9903 Segmental and somatic dysfunction of lumbar region: Secondary | ICD-10-CM | POA: Diagnosis not present

## 2014-12-03 DIAGNOSIS — M5126 Other intervertebral disc displacement, lumbar region: Secondary | ICD-10-CM | POA: Diagnosis not present

## 2014-12-03 DIAGNOSIS — M9901 Segmental and somatic dysfunction of cervical region: Secondary | ICD-10-CM | POA: Diagnosis not present

## 2014-12-03 DIAGNOSIS — M5137 Other intervertebral disc degeneration, lumbosacral region: Secondary | ICD-10-CM | POA: Diagnosis not present

## 2014-12-03 DIAGNOSIS — M542 Cervicalgia: Secondary | ICD-10-CM | POA: Diagnosis not present

## 2014-12-03 DIAGNOSIS — M791 Myalgia: Secondary | ICD-10-CM | POA: Diagnosis not present

## 2014-12-03 DIAGNOSIS — M5417 Radiculopathy, lumbosacral region: Secondary | ICD-10-CM | POA: Diagnosis not present

## 2014-12-03 DIAGNOSIS — M5410 Radiculopathy, site unspecified: Secondary | ICD-10-CM | POA: Diagnosis not present

## 2014-12-03 DIAGNOSIS — M545 Low back pain: Secondary | ICD-10-CM | POA: Diagnosis not present

## 2014-12-05 DIAGNOSIS — M5417 Radiculopathy, lumbosacral region: Secondary | ICD-10-CM | POA: Diagnosis not present

## 2014-12-05 DIAGNOSIS — M9901 Segmental and somatic dysfunction of cervical region: Secondary | ICD-10-CM | POA: Diagnosis not present

## 2014-12-05 DIAGNOSIS — M545 Low back pain: Secondary | ICD-10-CM | POA: Diagnosis not present

## 2014-12-05 DIAGNOSIS — M542 Cervicalgia: Secondary | ICD-10-CM | POA: Diagnosis not present

## 2014-12-05 DIAGNOSIS — M5410 Radiculopathy, site unspecified: Secondary | ICD-10-CM | POA: Diagnosis not present

## 2014-12-05 DIAGNOSIS — M461 Sacroiliitis, not elsewhere classified: Secondary | ICD-10-CM | POA: Diagnosis not present

## 2014-12-05 DIAGNOSIS — M5127 Other intervertebral disc displacement, lumbosacral region: Secondary | ICD-10-CM | POA: Diagnosis not present

## 2014-12-05 DIAGNOSIS — M5126 Other intervertebral disc displacement, lumbar region: Secondary | ICD-10-CM | POA: Diagnosis not present

## 2014-12-05 DIAGNOSIS — M791 Myalgia: Secondary | ICD-10-CM | POA: Diagnosis not present

## 2014-12-05 DIAGNOSIS — M9903 Segmental and somatic dysfunction of lumbar region: Secondary | ICD-10-CM | POA: Diagnosis not present

## 2014-12-05 DIAGNOSIS — M5137 Other intervertebral disc degeneration, lumbosacral region: Secondary | ICD-10-CM | POA: Diagnosis not present

## 2014-12-08 DIAGNOSIS — M545 Low back pain: Secondary | ICD-10-CM | POA: Diagnosis not present

## 2014-12-08 DIAGNOSIS — M791 Myalgia: Secondary | ICD-10-CM | POA: Diagnosis not present

## 2014-12-08 DIAGNOSIS — M461 Sacroiliitis, not elsewhere classified: Secondary | ICD-10-CM | POA: Diagnosis not present

## 2014-12-08 DIAGNOSIS — M542 Cervicalgia: Secondary | ICD-10-CM | POA: Diagnosis not present

## 2014-12-08 DIAGNOSIS — M5127 Other intervertebral disc displacement, lumbosacral region: Secondary | ICD-10-CM | POA: Diagnosis not present

## 2014-12-08 DIAGNOSIS — M5126 Other intervertebral disc displacement, lumbar region: Secondary | ICD-10-CM | POA: Diagnosis not present

## 2014-12-08 DIAGNOSIS — M5410 Radiculopathy, site unspecified: Secondary | ICD-10-CM | POA: Diagnosis not present

## 2014-12-08 DIAGNOSIS — M5137 Other intervertebral disc degeneration, lumbosacral region: Secondary | ICD-10-CM | POA: Diagnosis not present

## 2014-12-08 DIAGNOSIS — M9903 Segmental and somatic dysfunction of lumbar region: Secondary | ICD-10-CM | POA: Diagnosis not present

## 2014-12-08 DIAGNOSIS — M9901 Segmental and somatic dysfunction of cervical region: Secondary | ICD-10-CM | POA: Diagnosis not present

## 2014-12-08 DIAGNOSIS — M5417 Radiculopathy, lumbosacral region: Secondary | ICD-10-CM | POA: Diagnosis not present

## 2014-12-10 DIAGNOSIS — M5127 Other intervertebral disc displacement, lumbosacral region: Secondary | ICD-10-CM | POA: Diagnosis not present

## 2014-12-10 DIAGNOSIS — M542 Cervicalgia: Secondary | ICD-10-CM | POA: Diagnosis not present

## 2014-12-10 DIAGNOSIS — M5126 Other intervertebral disc displacement, lumbar region: Secondary | ICD-10-CM | POA: Diagnosis not present

## 2014-12-10 DIAGNOSIS — M9901 Segmental and somatic dysfunction of cervical region: Secondary | ICD-10-CM | POA: Diagnosis not present

## 2014-12-10 DIAGNOSIS — M5137 Other intervertebral disc degeneration, lumbosacral region: Secondary | ICD-10-CM | POA: Diagnosis not present

## 2014-12-10 DIAGNOSIS — M791 Myalgia: Secondary | ICD-10-CM | POA: Diagnosis not present

## 2014-12-10 DIAGNOSIS — M5410 Radiculopathy, site unspecified: Secondary | ICD-10-CM | POA: Diagnosis not present

## 2014-12-10 DIAGNOSIS — M9903 Segmental and somatic dysfunction of lumbar region: Secondary | ICD-10-CM | POA: Diagnosis not present

## 2014-12-10 DIAGNOSIS — M545 Low back pain: Secondary | ICD-10-CM | POA: Diagnosis not present

## 2014-12-10 DIAGNOSIS — M5417 Radiculopathy, lumbosacral region: Secondary | ICD-10-CM | POA: Diagnosis not present

## 2014-12-10 DIAGNOSIS — M461 Sacroiliitis, not elsewhere classified: Secondary | ICD-10-CM | POA: Diagnosis not present

## 2014-12-12 DIAGNOSIS — M9903 Segmental and somatic dysfunction of lumbar region: Secondary | ICD-10-CM | POA: Diagnosis not present

## 2014-12-12 DIAGNOSIS — M791 Myalgia: Secondary | ICD-10-CM | POA: Diagnosis not present

## 2014-12-12 DIAGNOSIS — M545 Low back pain: Secondary | ICD-10-CM | POA: Diagnosis not present

## 2014-12-12 DIAGNOSIS — M542 Cervicalgia: Secondary | ICD-10-CM | POA: Diagnosis not present

## 2014-12-12 DIAGNOSIS — M5127 Other intervertebral disc displacement, lumbosacral region: Secondary | ICD-10-CM | POA: Diagnosis not present

## 2014-12-12 DIAGNOSIS — M461 Sacroiliitis, not elsewhere classified: Secondary | ICD-10-CM | POA: Diagnosis not present

## 2014-12-12 DIAGNOSIS — M5417 Radiculopathy, lumbosacral region: Secondary | ICD-10-CM | POA: Diagnosis not present

## 2014-12-12 DIAGNOSIS — M5410 Radiculopathy, site unspecified: Secondary | ICD-10-CM | POA: Diagnosis not present

## 2014-12-12 DIAGNOSIS — M5126 Other intervertebral disc displacement, lumbar region: Secondary | ICD-10-CM | POA: Diagnosis not present

## 2014-12-12 DIAGNOSIS — M9901 Segmental and somatic dysfunction of cervical region: Secondary | ICD-10-CM | POA: Diagnosis not present

## 2014-12-12 DIAGNOSIS — M5137 Other intervertebral disc degeneration, lumbosacral region: Secondary | ICD-10-CM | POA: Diagnosis not present

## 2014-12-15 DIAGNOSIS — M542 Cervicalgia: Secondary | ICD-10-CM | POA: Diagnosis not present

## 2014-12-15 DIAGNOSIS — M9901 Segmental and somatic dysfunction of cervical region: Secondary | ICD-10-CM | POA: Diagnosis not present

## 2014-12-15 DIAGNOSIS — M5127 Other intervertebral disc displacement, lumbosacral region: Secondary | ICD-10-CM | POA: Diagnosis not present

## 2014-12-15 DIAGNOSIS — M5410 Radiculopathy, site unspecified: Secondary | ICD-10-CM | POA: Diagnosis not present

## 2014-12-15 DIAGNOSIS — M5417 Radiculopathy, lumbosacral region: Secondary | ICD-10-CM | POA: Diagnosis not present

## 2014-12-15 DIAGNOSIS — M9903 Segmental and somatic dysfunction of lumbar region: Secondary | ICD-10-CM | POA: Diagnosis not present

## 2014-12-15 DIAGNOSIS — M5126 Other intervertebral disc displacement, lumbar region: Secondary | ICD-10-CM | POA: Diagnosis not present

## 2014-12-15 DIAGNOSIS — M461 Sacroiliitis, not elsewhere classified: Secondary | ICD-10-CM | POA: Diagnosis not present

## 2014-12-15 DIAGNOSIS — M545 Low back pain: Secondary | ICD-10-CM | POA: Diagnosis not present

## 2014-12-15 DIAGNOSIS — M791 Myalgia: Secondary | ICD-10-CM | POA: Diagnosis not present

## 2014-12-15 DIAGNOSIS — M5137 Other intervertebral disc degeneration, lumbosacral region: Secondary | ICD-10-CM | POA: Diagnosis not present

## 2014-12-17 ENCOUNTER — Telehealth: Payer: Self-pay | Admitting: Internal Medicine

## 2014-12-17 DIAGNOSIS — E785 Hyperlipidemia, unspecified: Secondary | ICD-10-CM

## 2014-12-17 DIAGNOSIS — M461 Sacroiliitis, not elsewhere classified: Secondary | ICD-10-CM | POA: Diagnosis not present

## 2014-12-17 DIAGNOSIS — M5137 Other intervertebral disc degeneration, lumbosacral region: Secondary | ICD-10-CM | POA: Diagnosis not present

## 2014-12-17 DIAGNOSIS — M542 Cervicalgia: Secondary | ICD-10-CM | POA: Diagnosis not present

## 2014-12-17 DIAGNOSIS — M791 Myalgia: Secondary | ICD-10-CM | POA: Diagnosis not present

## 2014-12-17 DIAGNOSIS — M5410 Radiculopathy, site unspecified: Secondary | ICD-10-CM | POA: Diagnosis not present

## 2014-12-17 DIAGNOSIS — M5126 Other intervertebral disc displacement, lumbar region: Secondary | ICD-10-CM | POA: Diagnosis not present

## 2014-12-17 DIAGNOSIS — M545 Low back pain: Secondary | ICD-10-CM | POA: Diagnosis not present

## 2014-12-17 DIAGNOSIS — M5127 Other intervertebral disc displacement, lumbosacral region: Secondary | ICD-10-CM | POA: Diagnosis not present

## 2014-12-17 DIAGNOSIS — M5417 Radiculopathy, lumbosacral region: Secondary | ICD-10-CM | POA: Diagnosis not present

## 2014-12-17 DIAGNOSIS — M9901 Segmental and somatic dysfunction of cervical region: Secondary | ICD-10-CM | POA: Diagnosis not present

## 2014-12-17 DIAGNOSIS — M9903 Segmental and somatic dysfunction of lumbar region: Secondary | ICD-10-CM | POA: Diagnosis not present

## 2014-12-17 MED ORDER — ATORVASTATIN CALCIUM 40 MG PO TABS: 40.0000 mg | ORAL_TABLET | Freq: Every day | ORAL | Status: DC

## 2014-12-17 MED ORDER — QUINAPRIL HCL 40 MG PO TABS: 40.0000 mg | ORAL_TABLET | Freq: Every day | ORAL | Status: DC

## 2014-12-17 NOTE — Telephone Encounter (Signed)
Pt scheduled a med check mid may so i told pt i would send in meds to pharmacy

## 2014-12-19 DIAGNOSIS — M5033 Other cervical disc degeneration, cervicothoracic region: Secondary | ICD-10-CM | POA: Diagnosis not present

## 2014-12-19 DIAGNOSIS — M791 Myalgia: Secondary | ICD-10-CM | POA: Diagnosis not present

## 2014-12-19 DIAGNOSIS — M461 Sacroiliitis, not elsewhere classified: Secondary | ICD-10-CM | POA: Diagnosis not present

## 2014-12-19 DIAGNOSIS — M5410 Radiculopathy, site unspecified: Secondary | ICD-10-CM | POA: Diagnosis not present

## 2014-12-19 DIAGNOSIS — M5126 Other intervertebral disc displacement, lumbar region: Secondary | ICD-10-CM | POA: Diagnosis not present

## 2014-12-19 DIAGNOSIS — M542 Cervicalgia: Secondary | ICD-10-CM | POA: Diagnosis not present

## 2014-12-19 DIAGNOSIS — M47812 Spondylosis without myelopathy or radiculopathy, cervical region: Secondary | ICD-10-CM | POA: Diagnosis not present

## 2014-12-19 DIAGNOSIS — M9903 Segmental and somatic dysfunction of lumbar region: Secondary | ICD-10-CM | POA: Diagnosis not present

## 2014-12-19 DIAGNOSIS — M9901 Segmental and somatic dysfunction of cervical region: Secondary | ICD-10-CM | POA: Diagnosis not present

## 2014-12-19 DIAGNOSIS — M5127 Other intervertebral disc displacement, lumbosacral region: Secondary | ICD-10-CM | POA: Diagnosis not present

## 2014-12-19 DIAGNOSIS — M5417 Radiculopathy, lumbosacral region: Secondary | ICD-10-CM | POA: Diagnosis not present

## 2014-12-19 DIAGNOSIS — M545 Low back pain: Secondary | ICD-10-CM | POA: Diagnosis not present

## 2014-12-22 DIAGNOSIS — M5410 Radiculopathy, site unspecified: Secondary | ICD-10-CM | POA: Diagnosis not present

## 2014-12-22 DIAGNOSIS — M542 Cervicalgia: Secondary | ICD-10-CM | POA: Diagnosis not present

## 2014-12-22 DIAGNOSIS — M9903 Segmental and somatic dysfunction of lumbar region: Secondary | ICD-10-CM | POA: Diagnosis not present

## 2014-12-22 DIAGNOSIS — M9901 Segmental and somatic dysfunction of cervical region: Secondary | ICD-10-CM | POA: Diagnosis not present

## 2014-12-22 DIAGNOSIS — M5127 Other intervertebral disc displacement, lumbosacral region: Secondary | ICD-10-CM | POA: Diagnosis not present

## 2014-12-22 DIAGNOSIS — M461 Sacroiliitis, not elsewhere classified: Secondary | ICD-10-CM | POA: Diagnosis not present

## 2014-12-22 DIAGNOSIS — M5417 Radiculopathy, lumbosacral region: Secondary | ICD-10-CM | POA: Diagnosis not present

## 2014-12-22 DIAGNOSIS — M47812 Spondylosis without myelopathy or radiculopathy, cervical region: Secondary | ICD-10-CM | POA: Diagnosis not present

## 2014-12-22 DIAGNOSIS — M791 Myalgia: Secondary | ICD-10-CM | POA: Diagnosis not present

## 2014-12-22 DIAGNOSIS — M5126 Other intervertebral disc displacement, lumbar region: Secondary | ICD-10-CM | POA: Diagnosis not present

## 2014-12-22 DIAGNOSIS — M545 Low back pain: Secondary | ICD-10-CM | POA: Diagnosis not present

## 2014-12-22 DIAGNOSIS — M5033 Other cervical disc degeneration, cervicothoracic region: Secondary | ICD-10-CM | POA: Diagnosis not present

## 2014-12-24 DIAGNOSIS — M5033 Other cervical disc degeneration, cervicothoracic region: Secondary | ICD-10-CM | POA: Diagnosis not present

## 2014-12-24 DIAGNOSIS — M791 Myalgia: Secondary | ICD-10-CM | POA: Diagnosis not present

## 2014-12-24 DIAGNOSIS — M542 Cervicalgia: Secondary | ICD-10-CM | POA: Diagnosis not present

## 2014-12-24 DIAGNOSIS — M47812 Spondylosis without myelopathy or radiculopathy, cervical region: Secondary | ICD-10-CM | POA: Diagnosis not present

## 2014-12-24 DIAGNOSIS — M5410 Radiculopathy, site unspecified: Secondary | ICD-10-CM | POA: Diagnosis not present

## 2014-12-24 DIAGNOSIS — M545 Low back pain: Secondary | ICD-10-CM | POA: Diagnosis not present

## 2014-12-24 DIAGNOSIS — M5127 Other intervertebral disc displacement, lumbosacral region: Secondary | ICD-10-CM | POA: Diagnosis not present

## 2014-12-24 DIAGNOSIS — M5417 Radiculopathy, lumbosacral region: Secondary | ICD-10-CM | POA: Diagnosis not present

## 2014-12-24 DIAGNOSIS — M461 Sacroiliitis, not elsewhere classified: Secondary | ICD-10-CM | POA: Diagnosis not present

## 2014-12-24 DIAGNOSIS — M9901 Segmental and somatic dysfunction of cervical region: Secondary | ICD-10-CM | POA: Diagnosis not present

## 2014-12-24 DIAGNOSIS — M9903 Segmental and somatic dysfunction of lumbar region: Secondary | ICD-10-CM | POA: Diagnosis not present

## 2014-12-24 DIAGNOSIS — M5126 Other intervertebral disc displacement, lumbar region: Secondary | ICD-10-CM | POA: Diagnosis not present

## 2014-12-26 DIAGNOSIS — M461 Sacroiliitis, not elsewhere classified: Secondary | ICD-10-CM | POA: Diagnosis not present

## 2014-12-26 DIAGNOSIS — M542 Cervicalgia: Secondary | ICD-10-CM | POA: Diagnosis not present

## 2014-12-26 DIAGNOSIS — M47812 Spondylosis without myelopathy or radiculopathy, cervical region: Secondary | ICD-10-CM | POA: Diagnosis not present

## 2014-12-26 DIAGNOSIS — M5126 Other intervertebral disc displacement, lumbar region: Secondary | ICD-10-CM | POA: Diagnosis not present

## 2014-12-26 DIAGNOSIS — M791 Myalgia: Secondary | ICD-10-CM | POA: Diagnosis not present

## 2014-12-26 DIAGNOSIS — M545 Low back pain: Secondary | ICD-10-CM | POA: Diagnosis not present

## 2014-12-26 DIAGNOSIS — M9903 Segmental and somatic dysfunction of lumbar region: Secondary | ICD-10-CM | POA: Diagnosis not present

## 2014-12-26 DIAGNOSIS — M9901 Segmental and somatic dysfunction of cervical region: Secondary | ICD-10-CM | POA: Diagnosis not present

## 2014-12-26 DIAGNOSIS — M5127 Other intervertebral disc displacement, lumbosacral region: Secondary | ICD-10-CM | POA: Diagnosis not present

## 2014-12-26 DIAGNOSIS — M5417 Radiculopathy, lumbosacral region: Secondary | ICD-10-CM | POA: Diagnosis not present

## 2014-12-26 DIAGNOSIS — M5033 Other cervical disc degeneration, cervicothoracic region: Secondary | ICD-10-CM | POA: Diagnosis not present

## 2014-12-26 DIAGNOSIS — M5410 Radiculopathy, site unspecified: Secondary | ICD-10-CM | POA: Diagnosis not present

## 2014-12-29 ENCOUNTER — Encounter: Payer: Self-pay | Admitting: Family Medicine

## 2014-12-29 ENCOUNTER — Ambulatory Visit (INDEPENDENT_AMBULATORY_CARE_PROVIDER_SITE_OTHER): Payer: Medicare Other | Admitting: Family Medicine

## 2014-12-29 VITALS — BP 100/80 | HR 68 | Wt 173.4 lb

## 2014-12-29 DIAGNOSIS — M461 Sacroiliitis, not elsewhere classified: Secondary | ICD-10-CM | POA: Diagnosis not present

## 2014-12-29 DIAGNOSIS — E119 Type 2 diabetes mellitus without complications: Secondary | ICD-10-CM

## 2014-12-29 DIAGNOSIS — Z23 Encounter for immunization: Secondary | ICD-10-CM | POA: Diagnosis not present

## 2014-12-29 DIAGNOSIS — M791 Myalgia: Secondary | ICD-10-CM | POA: Diagnosis not present

## 2014-12-29 DIAGNOSIS — E785 Hyperlipidemia, unspecified: Secondary | ICD-10-CM

## 2014-12-29 DIAGNOSIS — M542 Cervicalgia: Secondary | ICD-10-CM | POA: Diagnosis not present

## 2014-12-29 DIAGNOSIS — I10 Essential (primary) hypertension: Secondary | ICD-10-CM

## 2014-12-29 DIAGNOSIS — M47812 Spondylosis without myelopathy or radiculopathy, cervical region: Secondary | ICD-10-CM | POA: Diagnosis not present

## 2014-12-29 DIAGNOSIS — M5127 Other intervertebral disc displacement, lumbosacral region: Secondary | ICD-10-CM | POA: Diagnosis not present

## 2014-12-29 DIAGNOSIS — M545 Low back pain: Secondary | ICD-10-CM | POA: Diagnosis not present

## 2014-12-29 DIAGNOSIS — M5126 Other intervertebral disc displacement, lumbar region: Secondary | ICD-10-CM | POA: Diagnosis not present

## 2014-12-29 DIAGNOSIS — M9903 Segmental and somatic dysfunction of lumbar region: Secondary | ICD-10-CM | POA: Diagnosis not present

## 2014-12-29 DIAGNOSIS — M5410 Radiculopathy, site unspecified: Secondary | ICD-10-CM | POA: Diagnosis not present

## 2014-12-29 DIAGNOSIS — M9901 Segmental and somatic dysfunction of cervical region: Secondary | ICD-10-CM | POA: Diagnosis not present

## 2014-12-29 DIAGNOSIS — M5417 Radiculopathy, lumbosacral region: Secondary | ICD-10-CM | POA: Diagnosis not present

## 2014-12-29 DIAGNOSIS — M5033 Other cervical disc degeneration, cervicothoracic region: Secondary | ICD-10-CM | POA: Diagnosis not present

## 2014-12-29 LAB — CBC WITH DIFFERENTIAL/PLATELET
BASOS ABS: 0 10*3/uL (ref 0.0–0.1)
BASOS PCT: 0 % (ref 0–1)
EOS ABS: 0.1 10*3/uL (ref 0.0–0.7)
Eosinophils Relative: 2 % (ref 0–5)
HCT: 43.4 % (ref 39.0–52.0)
Hemoglobin: 14.5 g/dL (ref 13.0–17.0)
Lymphocytes Relative: 29 % (ref 12–46)
Lymphs Abs: 2.1 10*3/uL (ref 0.7–4.0)
MCH: 27.1 pg (ref 26.0–34.0)
MCHC: 33.4 g/dL (ref 30.0–36.0)
MCV: 81.1 fL (ref 78.0–100.0)
MPV: 11.1 fL (ref 8.6–12.4)
Monocytes Absolute: 0.4 10*3/uL (ref 0.1–1.0)
Monocytes Relative: 6 % (ref 3–12)
NEUTROS PCT: 63 % (ref 43–77)
Neutro Abs: 4.5 10*3/uL (ref 1.7–7.7)
Platelets: 199 10*3/uL (ref 150–400)
RBC: 5.35 MIL/uL (ref 4.22–5.81)
RDW: 14.3 % (ref 11.5–15.5)
WBC: 7.2 10*3/uL (ref 4.0–10.5)

## 2014-12-29 LAB — COMPREHENSIVE METABOLIC PANEL
ALBUMIN: 3.9 g/dL (ref 3.5–5.2)
ALK PHOS: 102 U/L (ref 39–117)
ALT: 20 U/L (ref 0–53)
AST: 17 U/L (ref 0–37)
BILIRUBIN TOTAL: 0.5 mg/dL (ref 0.2–1.2)
BUN: 18 mg/dL (ref 6–23)
CALCIUM: 9.4 mg/dL (ref 8.4–10.5)
CO2: 27 mEq/L (ref 19–32)
CREATININE: 0.94 mg/dL (ref 0.50–1.35)
Chloride: 102 mEq/L (ref 96–112)
GLUCOSE: 122 mg/dL — AB (ref 70–99)
POTASSIUM: 4.5 meq/L (ref 3.5–5.3)
SODIUM: 139 meq/L (ref 135–145)
Total Protein: 6.7 g/dL (ref 6.0–8.3)

## 2014-12-29 LAB — LIPID PANEL
CHOL/HDL RATIO: 3 ratio
Cholesterol: 178 mg/dL (ref 0–200)
HDL: 59 mg/dL (ref >=40)
LDL Cholesterol: 102 mg/dL — ABNORMAL HIGH (ref 0–99)
TRIGLYCERIDES: 84 mg/dL (ref <150)
VLDL: 17 mg/dL (ref 0–40)

## 2014-12-29 LAB — HM DIABETES EYE EXAM

## 2014-12-29 MED ORDER — QUINAPRIL HCL 40 MG PO TABS: 40.0000 mg | ORAL_TABLET | Freq: Every day | ORAL | Status: DC

## 2014-12-29 MED ORDER — ATORVASTATIN CALCIUM 40 MG PO TABS: 40.0000 mg | ORAL_TABLET | Freq: Every day | ORAL | Status: DC

## 2014-12-29 NOTE — Progress Notes (Signed)
   Subjective:    Patient ID: Scott Parks, male    DOB: 1941/02/23, 74 y.o.   MRN: [id number]  HPI  He is here for a follow-up visit. He has been to the diabetes education program. He does check his feet regularly and has had an eye exam. His blood sugars run between 90 and 152. His diet and exercise are unchanged and he is happy with him. Smoking and drinking were reviewed. He has had some difficulty recently with low back pain that is seeing a chiropractor and getting good results with manipulation and rehabilitation. He is retired he keeps himself quite busy.   Review of Systems     Objective:   Physical Exam Alert and in no distress otherwise not examined. Hemoglobin A1c is 7.6.       Assessment & Plan:  Essential hypertension - Plan: CBC with Differential/Platelet, Comprehensive metabolic panel, quinapril (ACCUPRIL) 40 MG tablet  Hyperlipidemia - Plan: Lipid panel, atorvastatin (LIPITOR) 40 MG tablet  Type 2 diabetes mellitus without complication - Plan: CBC with Differential/Platelet, Comprehensive metabolic panel, Lipid panel, POCT UA - Microalbumin  Need for prophylactic vaccination against Streptococcus pneumoniae (pneumococcus) - Plan: Pneumococcal conjugate vaccine 13-valent He is doing an excellent job taking care of himself. Recommend he return here in roughly 6 months or sooner if he notes his blood sugars going up.

## 2014-12-31 DIAGNOSIS — M461 Sacroiliitis, not elsewhere classified: Secondary | ICD-10-CM | POA: Diagnosis not present

## 2014-12-31 DIAGNOSIS — M47812 Spondylosis without myelopathy or radiculopathy, cervical region: Secondary | ICD-10-CM | POA: Diagnosis not present

## 2014-12-31 DIAGNOSIS — M9901 Segmental and somatic dysfunction of cervical region: Secondary | ICD-10-CM | POA: Diagnosis not present

## 2014-12-31 DIAGNOSIS — M5126 Other intervertebral disc displacement, lumbar region: Secondary | ICD-10-CM | POA: Diagnosis not present

## 2014-12-31 DIAGNOSIS — M9903 Segmental and somatic dysfunction of lumbar region: Secondary | ICD-10-CM | POA: Diagnosis not present

## 2014-12-31 DIAGNOSIS — M542 Cervicalgia: Secondary | ICD-10-CM | POA: Diagnosis not present

## 2014-12-31 DIAGNOSIS — M5033 Other cervical disc degeneration, cervicothoracic region: Secondary | ICD-10-CM | POA: Diagnosis not present

## 2014-12-31 DIAGNOSIS — M545 Low back pain: Secondary | ICD-10-CM | POA: Diagnosis not present

## 2014-12-31 DIAGNOSIS — M5127 Other intervertebral disc displacement, lumbosacral region: Secondary | ICD-10-CM | POA: Diagnosis not present

## 2014-12-31 DIAGNOSIS — M791 Myalgia: Secondary | ICD-10-CM | POA: Diagnosis not present

## 2014-12-31 DIAGNOSIS — M5410 Radiculopathy, site unspecified: Secondary | ICD-10-CM | POA: Diagnosis not present

## 2014-12-31 DIAGNOSIS — M5417 Radiculopathy, lumbosacral region: Secondary | ICD-10-CM | POA: Diagnosis not present

## 2015-01-02 DIAGNOSIS — M9901 Segmental and somatic dysfunction of cervical region: Secondary | ICD-10-CM | POA: Diagnosis not present

## 2015-01-02 DIAGNOSIS — M47812 Spondylosis without myelopathy or radiculopathy, cervical region: Secondary | ICD-10-CM | POA: Diagnosis not present

## 2015-01-02 DIAGNOSIS — M5410 Radiculopathy, site unspecified: Secondary | ICD-10-CM | POA: Diagnosis not present

## 2015-01-02 DIAGNOSIS — M545 Low back pain: Secondary | ICD-10-CM | POA: Diagnosis not present

## 2015-01-02 DIAGNOSIS — M5127 Other intervertebral disc displacement, lumbosacral region: Secondary | ICD-10-CM | POA: Diagnosis not present

## 2015-01-02 DIAGNOSIS — M461 Sacroiliitis, not elsewhere classified: Secondary | ICD-10-CM | POA: Diagnosis not present

## 2015-01-02 DIAGNOSIS — M5126 Other intervertebral disc displacement, lumbar region: Secondary | ICD-10-CM | POA: Diagnosis not present

## 2015-01-02 DIAGNOSIS — M791 Myalgia: Secondary | ICD-10-CM | POA: Diagnosis not present

## 2015-01-02 DIAGNOSIS — M9903 Segmental and somatic dysfunction of lumbar region: Secondary | ICD-10-CM | POA: Diagnosis not present

## 2015-01-02 DIAGNOSIS — M5033 Other cervical disc degeneration, cervicothoracic region: Secondary | ICD-10-CM | POA: Diagnosis not present

## 2015-01-02 DIAGNOSIS — M542 Cervicalgia: Secondary | ICD-10-CM | POA: Diagnosis not present

## 2015-01-02 DIAGNOSIS — M5417 Radiculopathy, lumbosacral region: Secondary | ICD-10-CM | POA: Diagnosis not present

## 2015-01-06 DIAGNOSIS — M545 Low back pain: Secondary | ICD-10-CM | POA: Diagnosis not present

## 2015-01-06 DIAGNOSIS — M5127 Other intervertebral disc displacement, lumbosacral region: Secondary | ICD-10-CM | POA: Diagnosis not present

## 2015-01-06 DIAGNOSIS — M461 Sacroiliitis, not elsewhere classified: Secondary | ICD-10-CM | POA: Diagnosis not present

## 2015-01-06 DIAGNOSIS — M5033 Other cervical disc degeneration, cervicothoracic region: Secondary | ICD-10-CM | POA: Diagnosis not present

## 2015-01-06 DIAGNOSIS — M542 Cervicalgia: Secondary | ICD-10-CM | POA: Diagnosis not present

## 2015-01-06 DIAGNOSIS — M47812 Spondylosis without myelopathy or radiculopathy, cervical region: Secondary | ICD-10-CM | POA: Diagnosis not present

## 2015-01-06 DIAGNOSIS — M5126 Other intervertebral disc displacement, lumbar region: Secondary | ICD-10-CM | POA: Diagnosis not present

## 2015-01-06 DIAGNOSIS — M5417 Radiculopathy, lumbosacral region: Secondary | ICD-10-CM | POA: Diagnosis not present

## 2015-01-06 DIAGNOSIS — M5410 Radiculopathy, site unspecified: Secondary | ICD-10-CM | POA: Diagnosis not present

## 2015-01-06 DIAGNOSIS — M9903 Segmental and somatic dysfunction of lumbar region: Secondary | ICD-10-CM | POA: Diagnosis not present

## 2015-01-06 DIAGNOSIS — M9901 Segmental and somatic dysfunction of cervical region: Secondary | ICD-10-CM | POA: Diagnosis not present

## 2015-01-06 DIAGNOSIS — M791 Myalgia: Secondary | ICD-10-CM | POA: Diagnosis not present

## 2015-01-07 DIAGNOSIS — M5033 Other cervical disc degeneration, cervicothoracic region: Secondary | ICD-10-CM | POA: Diagnosis not present

## 2015-01-07 DIAGNOSIS — M791 Myalgia: Secondary | ICD-10-CM | POA: Diagnosis not present

## 2015-01-07 DIAGNOSIS — M542 Cervicalgia: Secondary | ICD-10-CM | POA: Diagnosis not present

## 2015-01-07 DIAGNOSIS — M461 Sacroiliitis, not elsewhere classified: Secondary | ICD-10-CM | POA: Diagnosis not present

## 2015-01-07 DIAGNOSIS — M47812 Spondylosis without myelopathy or radiculopathy, cervical region: Secondary | ICD-10-CM | POA: Diagnosis not present

## 2015-01-07 DIAGNOSIS — M5417 Radiculopathy, lumbosacral region: Secondary | ICD-10-CM | POA: Diagnosis not present

## 2015-01-07 DIAGNOSIS — M9901 Segmental and somatic dysfunction of cervical region: Secondary | ICD-10-CM | POA: Diagnosis not present

## 2015-01-07 DIAGNOSIS — M5127 Other intervertebral disc displacement, lumbosacral region: Secondary | ICD-10-CM | POA: Diagnosis not present

## 2015-01-07 DIAGNOSIS — M545 Low back pain: Secondary | ICD-10-CM | POA: Diagnosis not present

## 2015-01-07 DIAGNOSIS — M9903 Segmental and somatic dysfunction of lumbar region: Secondary | ICD-10-CM | POA: Diagnosis not present

## 2015-01-07 DIAGNOSIS — M5410 Radiculopathy, site unspecified: Secondary | ICD-10-CM | POA: Diagnosis not present

## 2015-01-07 DIAGNOSIS — M5126 Other intervertebral disc displacement, lumbar region: Secondary | ICD-10-CM | POA: Diagnosis not present

## 2015-01-09 DIAGNOSIS — M5127 Other intervertebral disc displacement, lumbosacral region: Secondary | ICD-10-CM | POA: Diagnosis not present

## 2015-01-09 DIAGNOSIS — M5126 Other intervertebral disc displacement, lumbar region: Secondary | ICD-10-CM | POA: Diagnosis not present

## 2015-01-09 DIAGNOSIS — M9901 Segmental and somatic dysfunction of cervical region: Secondary | ICD-10-CM | POA: Diagnosis not present

## 2015-01-09 DIAGNOSIS — M5410 Radiculopathy, site unspecified: Secondary | ICD-10-CM | POA: Diagnosis not present

## 2015-01-09 DIAGNOSIS — M791 Myalgia: Secondary | ICD-10-CM | POA: Diagnosis not present

## 2015-01-09 DIAGNOSIS — M542 Cervicalgia: Secondary | ICD-10-CM | POA: Diagnosis not present

## 2015-01-09 DIAGNOSIS — M9903 Segmental and somatic dysfunction of lumbar region: Secondary | ICD-10-CM | POA: Diagnosis not present

## 2015-01-09 DIAGNOSIS — M545 Low back pain: Secondary | ICD-10-CM | POA: Diagnosis not present

## 2015-01-09 DIAGNOSIS — M47812 Spondylosis without myelopathy or radiculopathy, cervical region: Secondary | ICD-10-CM | POA: Diagnosis not present

## 2015-01-09 DIAGNOSIS — M5417 Radiculopathy, lumbosacral region: Secondary | ICD-10-CM | POA: Diagnosis not present

## 2015-01-09 DIAGNOSIS — M5033 Other cervical disc degeneration, cervicothoracic region: Secondary | ICD-10-CM | POA: Diagnosis not present

## 2015-01-09 DIAGNOSIS — M461 Sacroiliitis, not elsewhere classified: Secondary | ICD-10-CM | POA: Diagnosis not present

## 2015-01-13 DIAGNOSIS — M9901 Segmental and somatic dysfunction of cervical region: Secondary | ICD-10-CM | POA: Diagnosis not present

## 2015-01-13 DIAGNOSIS — M545 Low back pain: Secondary | ICD-10-CM | POA: Diagnosis not present

## 2015-01-13 DIAGNOSIS — M542 Cervicalgia: Secondary | ICD-10-CM | POA: Diagnosis not present

## 2015-01-13 DIAGNOSIS — M791 Myalgia: Secondary | ICD-10-CM | POA: Diagnosis not present

## 2015-01-13 DIAGNOSIS — M5126 Other intervertebral disc displacement, lumbar region: Secondary | ICD-10-CM | POA: Diagnosis not present

## 2015-01-13 DIAGNOSIS — M5127 Other intervertebral disc displacement, lumbosacral region: Secondary | ICD-10-CM | POA: Diagnosis not present

## 2015-01-13 DIAGNOSIS — M5033 Other cervical disc degeneration, cervicothoracic region: Secondary | ICD-10-CM | POA: Diagnosis not present

## 2015-01-13 DIAGNOSIS — M9903 Segmental and somatic dysfunction of lumbar region: Secondary | ICD-10-CM | POA: Diagnosis not present

## 2015-01-13 DIAGNOSIS — M5417 Radiculopathy, lumbosacral region: Secondary | ICD-10-CM | POA: Diagnosis not present

## 2015-01-13 DIAGNOSIS — M47812 Spondylosis without myelopathy or radiculopathy, cervical region: Secondary | ICD-10-CM | POA: Diagnosis not present

## 2015-01-13 DIAGNOSIS — M5410 Radiculopathy, site unspecified: Secondary | ICD-10-CM | POA: Diagnosis not present

## 2015-01-13 DIAGNOSIS — M461 Sacroiliitis, not elsewhere classified: Secondary | ICD-10-CM | POA: Diagnosis not present

## 2015-01-14 DIAGNOSIS — M47812 Spondylosis without myelopathy or radiculopathy, cervical region: Secondary | ICD-10-CM | POA: Diagnosis not present

## 2015-01-14 DIAGNOSIS — M5417 Radiculopathy, lumbosacral region: Secondary | ICD-10-CM | POA: Diagnosis not present

## 2015-01-14 DIAGNOSIS — M5126 Other intervertebral disc displacement, lumbar region: Secondary | ICD-10-CM | POA: Diagnosis not present

## 2015-01-14 DIAGNOSIS — M791 Myalgia: Secondary | ICD-10-CM | POA: Diagnosis not present

## 2015-01-14 DIAGNOSIS — M461 Sacroiliitis, not elsewhere classified: Secondary | ICD-10-CM | POA: Diagnosis not present

## 2015-01-14 DIAGNOSIS — M5127 Other intervertebral disc displacement, lumbosacral region: Secondary | ICD-10-CM | POA: Diagnosis not present

## 2015-01-14 DIAGNOSIS — M5033 Other cervical disc degeneration, cervicothoracic region: Secondary | ICD-10-CM | POA: Diagnosis not present

## 2015-01-14 DIAGNOSIS — M5410 Radiculopathy, site unspecified: Secondary | ICD-10-CM | POA: Diagnosis not present

## 2015-01-14 DIAGNOSIS — M545 Low back pain: Secondary | ICD-10-CM | POA: Diagnosis not present

## 2015-01-14 DIAGNOSIS — M542 Cervicalgia: Secondary | ICD-10-CM | POA: Diagnosis not present

## 2015-01-14 DIAGNOSIS — M9903 Segmental and somatic dysfunction of lumbar region: Secondary | ICD-10-CM | POA: Diagnosis not present

## 2015-01-14 DIAGNOSIS — M9901 Segmental and somatic dysfunction of cervical region: Secondary | ICD-10-CM | POA: Diagnosis not present

## 2015-03-23 ENCOUNTER — Encounter: Payer: Self-pay | Admitting: Family Medicine

## 2015-03-23 ENCOUNTER — Ambulatory Visit (INDEPENDENT_AMBULATORY_CARE_PROVIDER_SITE_OTHER): Payer: Medicare Other | Admitting: Family Medicine

## 2015-03-23 VITALS — BP 120/70 | HR 64 | Wt 179.0 lb

## 2015-03-23 DIAGNOSIS — S8011XA Contusion of right lower leg, initial encounter: Secondary | ICD-10-CM | POA: Diagnosis not present

## 2015-03-23 NOTE — Progress Notes (Signed)
   Subjective:    Patient ID: Scott Parks, male    DOB: 01/26/1941, 74 y.o.   MRN: [id number]  HPI He sustained a minor injury to the right lower lateral calf area 2-1/2 weeks ago. Is concerned over whether this healed properly. Presently he is having no discharge, pain or swelling.   Review of Systems     Objective:   Physical Exam 2 cm pigmented area noted on the right lower calf approximately 3 inches above the lateral malleolus. There is no palpable tenderness or fullness.      Assessment & Plan:  Contusion of leg, right, initial encounter  I reassured him that nothing needed to be done at this point since there is no evidence of inadequate healing.

## 2015-07-01 ENCOUNTER — Ambulatory Visit (INDEPENDENT_AMBULATORY_CARE_PROVIDER_SITE_OTHER): Payer: Medicare Other | Admitting: Family Medicine

## 2015-07-01 ENCOUNTER — Encounter: Payer: Self-pay | Admitting: Family Medicine

## 2015-07-01 ENCOUNTER — Ambulatory Visit: Payer: Medicare Other | Admitting: Family Medicine

## 2015-07-01 VITALS — BP 124/70 | HR 70 | Ht 69.0 in | Wt 179.0 lb

## 2015-07-01 DIAGNOSIS — I10 Essential (primary) hypertension: Secondary | ICD-10-CM | POA: Diagnosis not present

## 2015-07-01 DIAGNOSIS — E785 Hyperlipidemia, unspecified: Secondary | ICD-10-CM

## 2015-07-01 DIAGNOSIS — Z23 Encounter for immunization: Secondary | ICD-10-CM

## 2015-07-01 DIAGNOSIS — R972 Elevated prostate specific antigen [PSA]: Secondary | ICD-10-CM

## 2015-07-01 DIAGNOSIS — E119 Type 2 diabetes mellitus without complications: Secondary | ICD-10-CM | POA: Diagnosis not present

## 2015-07-01 LAB — POCT GLYCOSYLATED HEMOGLOBIN (HGB A1C): Hemoglobin A1C: 7.4

## 2015-07-01 MED ORDER — QUINAPRIL HCL 40 MG PO TABS: 40.0000 mg | ORAL_TABLET | Freq: Every day | ORAL | Status: DC

## 2015-07-01 NOTE — Progress Notes (Signed)
  Subjective:    Patient ID: Scott Parks, male    DOB: [birth year], 74 y.o.   MRN: [id number]  Scott Parks is a 74 y.o. male who presents for follow-up of Type 2 diabetes mellitus.  Home blood sugar records: Patient test every other day Current symptoms/problems None Daily foot checks:yes  Any foot concerns: None Exercise: not doing as much as he normally does but he still keeps himself quite physically active. Eye:11/2014 He is followed regularly by urology for his elevated PSA. The following portions of the patient's history were reviewed and updated as appropriate: allergies, current medications, past medical history, past social history and problem list.  ROS as in subjective above.     Objective:    Physical Exam Alert and in no distress otherwise not examined.  Lab Review Diabetic Labs Latest Ref Rng 12/29/2014 10/22/2013 08/28/2012 11/08/2011  HbA1c <5.7 % - 7.3(H) - -  Chol 0 - 200 mg/dL 178 179 165 176  HDL >=40 mg/dL 59 65 59 63  Calc LDL 0 - 99 mg/dL 102(H) 100(H) 92 101(H)  Triglycerides <150 mg/dL 84 72 71 58  Creatinine 0.50 - 1.35 mg/dL 0.94 1.16 1.16 1.07   BP/Weight 03/23/2015 12/29/2014 12/04/2013 11/13/2013 [id number]  Systolic BP 123456 [id number] [id number] - [id number]  Diastolic BP 70 80 74 - 72  Wt. (Lbs) 179 173.4 185 185.4 186  BMI 26.42 25.6 27.31 27.37 27.45   A1c is 7.4 Scott Parks  reports that he quit smoking about 30 years ago. He does not have any smokeless tobacco history on file. He reports that he does not drink alcohol or use illicit drugs.     Assessment & Plan:    Diabetes mellitus, new onset ([place]) - Plan: POCT glycosylated hemoglobin (Hb A1C)  Elevated PSA  Hyperlipidemia  Essential hypertension - Plan: quinapril (ACCUPRIL) 40 MG tablet  Need for prophylactic vaccination and inoculation against influenza - Plan: Flu vaccine HIGH DOSE PF (Fluzone High dose)   1. Rx changes: none discussed possibility placing him on metformin however he is decided to try and do  better job with his diet and exercise. 2. Education: Reviewed 'ABCs' of diabetes management (respective goals in parentheses):  A1C (<7), blood pressure (<130/80), and cholesterol (LDL <100). 3. Compliance at present is estimated to be good. Efforts to improve compliance (if necessary) will be directed at increased exercise. 4. Follow up:4 months

## 2015-07-24 ENCOUNTER — Telehealth: Payer: Self-pay | Admitting: Family Medicine

## 2015-07-24 DIAGNOSIS — I10 Essential (primary) hypertension: Secondary | ICD-10-CM

## 2015-07-24 MED ORDER — QUINAPRIL HCL 40 MG PO TABS: 40.0000 mg | ORAL_TABLET | Freq: Every day | ORAL | Status: DC

## 2015-07-24 NOTE — Telephone Encounter (Signed)
Sent in med

## 2015-07-24 NOTE — Telephone Encounter (Signed)
Pt called and was requesting his RX [facility], [facility]

## 2015-08-25 ENCOUNTER — Telehealth: Payer: Self-pay | Admitting: Family Medicine

## 2015-08-25 DIAGNOSIS — E785 Hyperlipidemia, unspecified: Secondary | ICD-10-CM

## 2015-08-25 MED ORDER — ATORVASTATIN CALCIUM 40 MG PO TABS: 40.0000 mg | ORAL_TABLET | Freq: Every day | ORAL | Status: DC

## 2015-08-25 NOTE — Telephone Encounter (Signed)
Lipitor refilled.  Patient aware.

## 2015-08-25 NOTE — Telephone Encounter (Signed)
Requesting refill on Lipitor 40mg 

## 2015-09-30 DIAGNOSIS — R972 Elevated prostate specific antigen [PSA]: Secondary | ICD-10-CM | POA: Diagnosis not present

## 2015-10-07 DIAGNOSIS — N400 Enlarged prostate without lower urinary tract symptoms: Secondary | ICD-10-CM | POA: Diagnosis not present

## 2015-10-07 DIAGNOSIS — R972 Elevated prostate specific antigen [PSA]: Secondary | ICD-10-CM | POA: Diagnosis not present

## 2015-10-07 DIAGNOSIS — Z0000 Encounter for general adult medical examination without abnormal findings: Secondary | ICD-10-CM | POA: Diagnosis not present

## 2015-10-07 DIAGNOSIS — N5201 Erectile dysfunction due to arterial insufficiency: Secondary | ICD-10-CM | POA: Diagnosis not present

## 2015-10-23 ENCOUNTER — Encounter: Payer: Self-pay | Admitting: *Deleted

## 2015-10-29 ENCOUNTER — Ambulatory Visit (INDEPENDENT_AMBULATORY_CARE_PROVIDER_SITE_OTHER): Payer: Medicare Other | Admitting: Family Medicine

## 2015-10-29 ENCOUNTER — Encounter: Payer: Self-pay | Admitting: Family Medicine

## 2015-10-29 VITALS — BP 124/80 | HR 63 | Ht 69.0 in | Wt 178.0 lb

## 2015-10-29 DIAGNOSIS — E118 Type 2 diabetes mellitus with unspecified complications: Secondary | ICD-10-CM | POA: Diagnosis not present

## 2015-10-29 DIAGNOSIS — I10 Essential (primary) hypertension: Secondary | ICD-10-CM | POA: Diagnosis not present

## 2015-10-29 DIAGNOSIS — L918 Other hypertrophic disorders of the skin: Secondary | ICD-10-CM | POA: Diagnosis not present

## 2015-10-29 DIAGNOSIS — M545 Low back pain, unspecified: Secondary | ICD-10-CM

## 2015-10-29 DIAGNOSIS — E119 Type 2 diabetes mellitus without complications: Secondary | ICD-10-CM | POA: Diagnosis not present

## 2015-10-29 DIAGNOSIS — E785 Hyperlipidemia, unspecified: Secondary | ICD-10-CM | POA: Diagnosis not present

## 2015-10-29 DIAGNOSIS — R972 Elevated prostate specific antigen [PSA]: Secondary | ICD-10-CM | POA: Diagnosis not present

## 2015-10-29 LAB — POCT GLYCOSYLATED HEMOGLOBIN (HGB A1C): Hemoglobin A1C: 8.1

## 2015-10-29 MED ORDER — ATORVASTATIN CALCIUM 40 MG PO TABS: 40.0000 mg | ORAL_TABLET | Freq: Every day | ORAL | Status: DC

## 2015-10-29 MED ORDER — QUINAPRIL HCL 40 MG PO TABS: 40.0000 mg | ORAL_TABLET | Freq: Every day | ORAL | Status: DC

## 2015-10-29 NOTE — Progress Notes (Signed)
   Subjective:    Patient ID: Scott Parks, male    DOB: [birth year], 75 y.o.   MRN: [id number]  HPI    Review of Systems     Objective:   Physical Exam Less than 5 mm skin tag is noted on the left lower eyelid.       Assessment & Plan:  Diabetes mellitus, new onset ([place]) - Plan: HgB A1c  Elevated PSA  Hyperlipidemia - Plan: atorvastatin (LIPITOR) 40 MG tablet  Essential hypertension - Plan: quinapril (ACCUPRIL) 40 MG tablet  Type 2 diabetes mellitus with complication, without long-term current use of insulin ([facility])  Right-sided low back pain without sciatica  Skin tag she was cleaned with alcohol and excised. The base was hyfrecated with silver nitrate without difficulty.

## 2015-10-29 NOTE — Progress Notes (Signed)
Subjective:    Patient ID: Scott Parks, male    DOB: 1941-06-21, 75 y.o.   MRN: [id number]  Scott Parks is a 75 y.o. male who presents for follow-up of Type 2 diabetes mellitus.he also has elevated PSA and has had 2 biopsies of which have been negative. He is being followed regularly by urology. He does have continued difficulty with low back pain with some radiation and he does take NSAIDs intermittently. This does not keep him from being physically active area he continues on his blood pressure medication as well as Lipitor without difficulty. Patient is checking home blood sugars.   Home blood sugar records: BGs consistently in an acceptable rangeof 116-140 How often is blood sugars being checked: Once a day Current symptoms/problems include none and have been stable. Daily foot checks: Yes   Any foot concerns: No Last eye exam: April 2016 Diet is adequate Exercise: Gym/ health club routine includes jogging on track  and light weights.4 times per week  The following portions of the patient's history were reviewed and updated as appropriate: allergies, current medications, past medical history, past social history and problem list.  ROS as in subjective above.     Objective:    Physical Exam Alert and in no distress.good lumbar motion. No tenderness to palpation over the SI joints but slight tenderness over the right sciatic notch. Straight leg raising with normal DTRs. Normal hip motion.  Height 5\' 9"  (1.753 m), weight 178 lb (80.74 kg).  Lab Review Diabetic Labs Latest Ref Rng 07/01/2015 12/29/2014 10/22/2013 08/28/2012 11/08/2011  HbA1c - 7.4 - 7.3(H) - -  Chol 0 - 200 mg/dL - 178 179 165 176  HDL >=40 mg/dL - 59 65 59 63  Calc LDL 0 - 99 mg/dL - 102(H) 100(H) 92 101(H)  Triglycerides <150 mg/dL - 84 72 71 58  Creatinine 0.50 - 1.35 mg/dL - 0.94 1.16 1.16 1.07   BP/Weight 10/29/2015 07/01/2015 03/23/2015 12/29/2014 123456  Systolic BP - A999333 123456 123XX123 123456  Diastolic BP - 70 70  80 74  Wt. (Lbs) 178 179 179 173.4 185  BMI 26.27 26.42 26.42 25.6 27.31   Foot/eye exam completion dates Latest Ref Rng 12/29/2014  Eye Exam No Retinopathy No Retinopathy  Foot Form Completion - -   A1c is 8.1 Scott Parks  reports that he quit smoking about 31 years ago. He does not have any smokeless tobacco history on file. He reports that he does not drink alcohol or use illicit drugs.     Assessment & Plan:    Diabetes mellitus, new onset ([place]) - Plan: HgB A1c  Elevated PSA  Hyperlipidemia - Plan: atorvastatin (LIPITOR) 40 MG tablet  Essential hypertension - Plan: quinapril (ACCUPRIL) 40 MG tablet  Type 2 diabetes mellitus with complication, without long-term current use of insulin ([facility])  Right-sided low back pain without sciatica   1. Rx changes: metformin will be added to his regimen. Instructions on possible side effects were also given. 2. Education: Reviewed 'ABCs' of diabetes management (respective goals in parentheses):  A1C (<7), blood pressure (<130/80), and cholesterol (LDL <100). 3. Compliance at present is estimated to be fair. Efforts to improve compliance (if necessary) will be directed at regular blood sugar monitoring: daily. 4. Follow up: 4 months  5. Discussed care of his back in terms of proper posturing exercises etc. He is doing fairly well so further intervention at this point will not be needed. He was comfortable with this.

## 2015-11-09 ENCOUNTER — Telehealth: Payer: Self-pay | Admitting: Family Medicine

## 2015-11-09 MED ORDER — METFORMIN HCL 500 MG PO TABS: 500.0000 mg | ORAL_TABLET | Freq: Two times a day (BID) | ORAL | Status: DC

## 2015-11-09 NOTE — Telephone Encounter (Signed)
Left message for patient to call back to let us know what glucometer he wants and that his medication has been sent to pharmacy.

## 2015-11-09 NOTE — Telephone Encounter (Signed)
Tell him my apologies for not calling him sooner. Find out the type of glucometer he hasn't make sure he gets the lancets and strips

## 2015-11-09 NOTE — Telephone Encounter (Signed)
Pt called and states that you was suppose to be on a RX that lowers his A1 C and it was never called in and all needs test strips, states that was never called in from his last visit, [facility] [place] [place] and pt can be reached at [phone]

## 2015-11-11 DIAGNOSIS — M5136 Other intervertebral disc degeneration, lumbar region: Secondary | ICD-10-CM | POA: Diagnosis not present

## 2015-11-11 DIAGNOSIS — M5441 Lumbago with sciatica, right side: Secondary | ICD-10-CM | POA: Diagnosis not present

## 2015-11-11 DIAGNOSIS — G8929 Other chronic pain: Secondary | ICD-10-CM | POA: Diagnosis not present

## 2015-11-11 DIAGNOSIS — R292 Abnormal reflex: Secondary | ICD-10-CM | POA: Diagnosis not present

## 2015-11-17 DIAGNOSIS — M545 Low back pain: Secondary | ICD-10-CM | POA: Diagnosis not present

## 2015-12-11 DIAGNOSIS — M5136 Other intervertebral disc degeneration, lumbar region: Secondary | ICD-10-CM | POA: Diagnosis not present

## 2015-12-11 DIAGNOSIS — M5116 Intervertebral disc disorders with radiculopathy, lumbar region: Secondary | ICD-10-CM | POA: Diagnosis not present

## 2015-12-11 DIAGNOSIS — M4726 Other spondylosis with radiculopathy, lumbar region: Secondary | ICD-10-CM | POA: Diagnosis not present

## 2015-12-11 DIAGNOSIS — M4727 Other spondylosis with radiculopathy, lumbosacral region: Secondary | ICD-10-CM | POA: Diagnosis not present

## 2015-12-25 DIAGNOSIS — R292 Abnormal reflex: Secondary | ICD-10-CM | POA: Diagnosis not present

## 2015-12-25 DIAGNOSIS — M5136 Other intervertebral disc degeneration, lumbar region: Secondary | ICD-10-CM | POA: Diagnosis not present

## 2015-12-25 DIAGNOSIS — M5441 Lumbago with sciatica, right side: Secondary | ICD-10-CM | POA: Diagnosis not present

## 2015-12-25 DIAGNOSIS — G8929 Other chronic pain: Secondary | ICD-10-CM | POA: Diagnosis not present

## 2016-01-05 DIAGNOSIS — M4316 Spondylolisthesis, lumbar region: Secondary | ICD-10-CM | POA: Diagnosis not present

## 2016-01-05 DIAGNOSIS — Z6826 Body mass index (BMI) 26.0-26.9, adult: Secondary | ICD-10-CM | POA: Diagnosis not present

## 2016-01-05 DIAGNOSIS — R030 Elevated blood-pressure reading, without diagnosis of hypertension: Secondary | ICD-10-CM | POA: Diagnosis not present

## 2016-01-05 DIAGNOSIS — M5416 Radiculopathy, lumbar region: Secondary | ICD-10-CM | POA: Diagnosis not present

## 2016-01-12 ENCOUNTER — Other Ambulatory Visit: Payer: Self-pay | Admitting: Neurological Surgery

## 2016-01-12 DIAGNOSIS — M5416 Radiculopathy, lumbar region: Secondary | ICD-10-CM

## 2016-01-25 ENCOUNTER — Ambulatory Visit
Admission: RE | Admit: 2016-01-25 | Discharge: 2016-01-25 | Disposition: A | Discharge status: Home / Self Care | Payer: Medicare Other | Source: Ambulatory Visit | Attending: Neurological Surgery | Admitting: Neurological Surgery

## 2016-01-25 ENCOUNTER — Other Ambulatory Visit: Payer: Self-pay | Admitting: Neurological Surgery

## 2016-01-25 DIAGNOSIS — M5416 Radiculopathy, lumbar region: Secondary | ICD-10-CM

## 2016-01-25 DIAGNOSIS — M5126 Other intervertebral disc displacement, lumbar region: Secondary | ICD-10-CM | POA: Diagnosis not present

## 2016-01-25 MED ORDER — IOPAMIDOL (ISOVUE-M 200) INJECTION 41%
1.0000 mL | Freq: Once | INTRAMUSCULAR | Status: AC
  Administered 2016-01-25: 1 mL via EPIDURAL

## 2016-01-25 MED ORDER — METHYLPREDNISOLONE ACETATE 40 MG/ML INJ SUSP (RADIOLOG
120.0000 mg | Freq: Once | INTRAMUSCULAR | Status: AC
  Administered 2016-01-25: 120 mg via EPIDURAL

## 2016-01-25 NOTE — Discharge Instructions (Signed)

## 2016-02-19 DIAGNOSIS — M5416 Radiculopathy, lumbar region: Secondary | ICD-10-CM | POA: Diagnosis not present

## 2016-02-24 ENCOUNTER — Other Ambulatory Visit: Payer: Self-pay | Admitting: Neurological Surgery

## 2016-02-24 DIAGNOSIS — M5416 Radiculopathy, lumbar region: Secondary | ICD-10-CM

## 2016-02-26 ENCOUNTER — Ambulatory Visit
Admission: RE | Admit: 2016-02-26 | Discharge: 2016-02-26 | Disposition: A | Discharge status: Home / Self Care | Payer: Medicare Other | Source: Ambulatory Visit | Attending: Neurological Surgery | Admitting: Neurological Surgery

## 2016-02-26 DIAGNOSIS — M4806 Spinal stenosis, lumbar region: Secondary | ICD-10-CM | POA: Diagnosis not present

## 2016-02-26 DIAGNOSIS — M5416 Radiculopathy, lumbar region: Secondary | ICD-10-CM

## 2016-02-29 ENCOUNTER — Other Ambulatory Visit: Payer: Self-pay | Admitting: Neurological Surgery

## 2016-02-29 ENCOUNTER — Encounter: Payer: Self-pay | Admitting: Family Medicine

## 2016-02-29 ENCOUNTER — Ambulatory Visit (INDEPENDENT_AMBULATORY_CARE_PROVIDER_SITE_OTHER): Payer: Medicare Other | Admitting: Family Medicine

## 2016-02-29 VITALS — BP 128/88 | HR 70 | Ht 69.0 in | Wt 174.6 lb

## 2016-02-29 DIAGNOSIS — M545 Low back pain, unspecified: Secondary | ICD-10-CM

## 2016-02-29 DIAGNOSIS — E118 Type 2 diabetes mellitus with unspecified complications: Secondary | ICD-10-CM | POA: Diagnosis not present

## 2016-02-29 DIAGNOSIS — E785 Hyperlipidemia, unspecified: Secondary | ICD-10-CM | POA: Diagnosis not present

## 2016-02-29 DIAGNOSIS — I10 Essential (primary) hypertension: Secondary | ICD-10-CM | POA: Diagnosis not present

## 2016-02-29 DIAGNOSIS — M4316 Spondylolisthesis, lumbar region: Secondary | ICD-10-CM | POA: Diagnosis not present

## 2016-02-29 DIAGNOSIS — M4806 Spinal stenosis, lumbar region: Secondary | ICD-10-CM | POA: Diagnosis not present

## 2016-02-29 DIAGNOSIS — E1169 Type 2 diabetes mellitus with other specified complication: Secondary | ICD-10-CM

## 2016-02-29 DIAGNOSIS — E1159 Type 2 diabetes mellitus with other circulatory complications: Secondary | ICD-10-CM | POA: Diagnosis not present

## 2016-02-29 DIAGNOSIS — Z6826 Body mass index (BMI) 26.0-26.9, adult: Secondary | ICD-10-CM | POA: Diagnosis not present

## 2016-02-29 LAB — POCT GLYCOSYLATED HEMOGLOBIN (HGB A1C): HEMOGLOBIN A1C: 8.5

## 2016-02-29 LAB — POCT UA - MICROALBUMIN
ALBUMIN/CREATININE RATIO, URINE, POC: 4.7
Creatinine, POC: 212.1 mg/dL
MICROALBUMIN (UR) POC: 10 mg/L

## 2016-02-29 MED ORDER — SITAGLIPTIN PHOSPHATE 100 MG PO TABS: 100.0000 mg | ORAL_TABLET | Freq: Every day | ORAL | Status: DC

## 2016-02-29 MED ORDER — ATORVASTATIN CALCIUM 40 MG PO TABS: 40.0000 mg | ORAL_TABLET | Freq: Every day | ORAL | Status: DC

## 2016-02-29 MED ORDER — METFORMIN HCL 1000 MG PO TABS: 1000.0000 mg | ORAL_TABLET | Freq: Two times a day (BID) | ORAL | Status: DC

## 2016-02-29 MED ORDER — QUINAPRIL HCL 40 MG PO TABS: 40.0000 mg | ORAL_TABLET | Freq: Every day | ORAL | Status: DC

## 2016-02-29 NOTE — Progress Notes (Signed)
  Subjective:    Patient ID: Scott Parks, male    DOB: 30-Oct-1940, 75 y.o.   MRN: [id number]  Scott Parks is a 75 y.o. male who presents for follow-up of Type 2 diabetes mellitus.  Patient is checking home blood sugars.   Home blood sugar record 130 to 170 How often is blood sugars being checked: ever so often Current symptoms/problems  none Daily foot checks: yes   Any foot concerns: none Last eye exam: 4/16 Exercise: not now due to spine. He has been having a lot of difficulty dealing with low back pain. He has seen orthopedics as well as neurosurgery. He is in the process of being more thoroughly evaluated for this. This has interfered with his exercise. Continues on his blood pressure as well as cholesterol medications. Review of his record indicates he did have AAA evaluation while he was living in [place].  The following portions of the patient's history were reviewed and updated as appropriate: allergies, current medications, past medical history, past social history and problem list.  ROS as in subjective above.     Objective:    Physical Exam Alert and in no distress Foot exam is normal Lab Review Diabetic Labs Latest Ref Rng 10/29/2015 07/01/2015 12/29/2014 10/22/2013 08/28/2012  HbA1c - 8.1 7.4 - 7.3(H) -  Chol 0 - 200 mg/dL - - 178 179 165  HDL >=40 mg/dL - - 59 65 59  Calc LDL 0 - 99 mg/dL - - 102(H) 100(H) 92  Triglycerides <150 mg/dL - - 84 72 71  Creatinine 0.50 - 1.35 mg/dL - - 0.94 1.16 1.16   BP/Weight 01/25/2016 10/29/2015 07/01/2015 03/23/2015 A999333  Systolic BP [id number] A999333 A999333 123456 123XX123  Diastolic BP 84 80 70 70 80  Wt. (Lbs) - 178 179 179 173.4  BMI - 26.27 26.42 26.42 25.6   Foot/eye exam completion dates Latest Ref Rng 12/29/2014  Eye Exam No Retinopathy No Retinopathy  Foot Form Completion - -  A1C8.5   Thor  reports that he quit smoking about 31 years ago. He does not have any smokeless tobacco history on file. He reports that he does not drink alcohol or  use illicit drugs.     Assessment & Plan:    Type 2 diabetes mellitus with complication, without long-term current use of insulin ([place]) - Plan: POCT glycosylated hemoglobin (Hb A1C), POCT UA - Microalbumin, sitaGLIPtin (JANUVIA) 100 MG tablet, CBC with Differential/Platelet, Comprehensive metabolic panel, Lipid panel, metFORMIN (GLUCOPHAGE) 1000 MG tablet  Essential hypertension - Plan: quinapril (ACCUPRIL) 40 MG tablet  Hyperlipidemia associated with type 2 diabetes mellitus ([place]) - Plan: Lipid panel, atorvastatin (LIPITOR) 40 MG tablet  Right-sided low back pain without sciatica  Hypertension associated with diabetes ([place])   1. Rx changes: Increase metformin to 1000 twice a day and add Januvia 100 mg. 2. Education: Reviewed 'ABCs' of diabetes management (respective goals in parentheses):  A1C (<7), blood pressure (<130/80), and cholesterol (LDL <100). 3. Compliance at present is estimated to be fair. Efforts to improve compliance (if necessary) will be directed at increased exercise. As tolerated due to his back pain. 4. Follow up: 4 months  I also mentioned to have them talk to the surgeons about the possibility of using a neuropathic pain medication. Gave him the names of several which she will discuss with his surgeon.

## 2016-03-01 ENCOUNTER — Telehealth: Payer: Self-pay | Admitting: Family Medicine

## 2016-03-01 LAB — LIPID PANEL
Cholesterol: 172 mg/dL (ref 125–200)
HDL: 78 mg/dL (ref >=40)
LDL CALC: 82 mg/dL (ref <130)
Total CHOL/HDL Ratio: 2.2 Ratio (ref <=5.0)
Triglycerides: 62 mg/dL (ref <150)
VLDL: 12 mg/dL (ref <30)

## 2016-03-01 LAB — COMPREHENSIVE METABOLIC PANEL
ALK PHOS: 96 U/L (ref 40–115)
ALT: 15 U/L (ref 9–46)
AST: 14 U/L (ref 10–35)
Albumin: 3.9 g/dL (ref 3.6–5.1)
BILIRUBIN TOTAL: 0.6 mg/dL (ref 0.2–1.2)
BUN: 17 mg/dL (ref 7–25)
CO2: 26 mmol/L (ref 20–31)
CREATININE: 1.08 mg/dL (ref 0.70–1.18)
Calcium: 9.3 mg/dL (ref 8.6–10.3)
Chloride: 103 mmol/L (ref 98–110)
GLUCOSE: 167 mg/dL — AB (ref 65–99)
Potassium: 4 mmol/L (ref 3.5–5.3)
SODIUM: 139 mmol/L (ref 135–146)
Total Protein: 6.1 g/dL (ref 6.1–8.1)

## 2016-03-01 LAB — CBC WITH DIFFERENTIAL/PLATELET
BASOS PCT: 0 %
Basophils Absolute: 0 cells/uL (ref 0–200)
EOS PCT: 4 %
Eosinophils Absolute: 240 cells/uL (ref 15–500)
HCT: 41.6 % (ref 38.5–50.0)
HEMOGLOBIN: 13.8 g/dL (ref 13.2–17.1)
LYMPHS ABS: 1440 {cells}/uL (ref 850–3900)
LYMPHS PCT: 24 %
MCH: 27.3 pg (ref 27.0–33.0)
MCHC: 33.2 g/dL (ref 32.0–36.0)
MCV: 82.4 fL (ref 80.0–100.0)
MONO ABS: 480 {cells}/uL (ref 200–950)
MPV: 10.4 fL (ref 7.5–12.5)
Monocytes Relative: 8 %
NEUTROS PCT: 64 %
Neutro Abs: 3840 cells/uL (ref 1500–7800)
Platelets: 177 10*3/uL (ref 140–400)
RBC: 5.05 MIL/uL (ref 4.20–5.80)
RDW: 15 % (ref 11.0–15.0)
WBC: 6 10*3/uL (ref 4.0–10.5)

## 2016-03-01 NOTE — Telephone Encounter (Signed)
Rcvd form from [facility] stating that pt is taking Januvia at a dosing greater than 50 mg daily & is 73 yrs old or older. Also states that renal function declines with age & pt may require a dosing adjmnt. Form placed in Dr Lanice Shirts folder for completion

## 2016-03-02 ENCOUNTER — Other Ambulatory Visit: Payer: Medicare Other

## 2016-03-29 NOTE — Pre-Procedure Instructions (Signed)
Scott Parks  03/29/2016      [facility] - [place], [place] [address] Phone: [phone] Fax: [phone]  [facility] [place] [address] [facility] [address] [place] [address] Phone: [phone] Fax: [phone]  [id number] [place], [address] AT [address] [place] [address] Phone: [phone] Fax: [phone]    Your procedure is scheduled on Thursday, August 24th, 2017.  Report to [facility] Admitting at 5:30 A.M.   Call this number if you have problems the morning of surgery:  [phone]   Remember:  Do not eat food or drink liquids after midnight.   Take these medicines the morning of surgery with A SIP OF WATER: None.   WHAT DO I DO ABOUT MY DIABETES MEDICATION?  Marland Kitchen Do not take oral diabetes medicines (pills) the morning of surgery.  Do NOT take Metformin (Glucophate) the morning of surgery.   7 days prior to surgery, stop taking: Aspirin, NSAIDS, Aleve, Naproxen, Ibuprofen, Advil, Motrin, BC's, Goody's, Fish oil, all herbal medications, and all vitamins.    Do not wear jewelry.  Do not wear lotions, powders, or colgones.  You may NOT wear deoderant.  Men may shave face and neck.  Do not bring valuables to the hospital.   [facility] is not responsible for any belongings or valuables.  Contacts, dentures or bridgework may not be worn into surgery.  Leave your suitcase in the car.  After surgery it may be brought to your room.  For patients admitted to the hospital, discharge time will be determined by your treatment team.  Patients discharged the day of surgery will not be allowed to drive home.   Special instructions:  Preparing for Surgery.   Please read over the following fact sheets that you were given. MRSA  Information    How to Manage Your Diabetes Before and After Surgery  Why is it important to control my blood sugar before and after surgery? . Improving blood sugar levels before and after surgery helps healing and can limit problems. . A way of improving blood sugar control is eating a healthy diet by: o  Eating less sugar and carbohydrates o  Increasing activity/exercise o  Talking with your doctor about reaching your blood sugar goals . High blood sugars (greater than 180 mg/dL) can raise your risk of infections and slow your recovery, so you will need to focus on controlling your diabetes during the weeks before surgery. . Make sure that the doctor who takes care of your diabetes knows about your planned surgery including the date and location.  How do I manage my blood sugar before surgery? . Check your blood sugar at least 4 times a day, starting 2 days before surgery, to make sure that the level is not too high or low. o Check your blood sugar the morning of your surgery when you wake up and every 2 hours until you get to the Short Stay unit. . If your blood sugar is less than 70 mg/dL, you will need to treat for low blood sugar: o Do not take insulin. o Treat a low blood sugar (less than 70 mg/dL) with  cup of clear juice (cranberry or apple), 4 glucose tablets, OR glucose gel. o Recheck blood sugar in 15 minutes  after treatment (to make sure it is greater than 70 mg/dL). If your blood sugar is not greater than 70 mg/dL on recheck, call [phone] for further instructions. . Report your blood sugar to the short stay nurse when you get to Short Stay.  . If you are admitted to the hospital after surgery: o Your blood sugar will be checked by the staff and you will probably be given insulin after surgery (instead of oral diabetes medicines) to make sure you have good blood sugar levels. o The goal for blood sugar control after surgery is 80-180 mg/dL.    [facility]- Preparing  For Surgery  Before surgery, you can play an important role. Because skin is not sterile, your skin needs to be as free of germs as possible. You can reduce the number of germs on your skin by washing with CHG (chlorahexidine gluconate) Soap before surgery.  CHG is an antiseptic cleaner which kills germs and bonds with the skin to continue killing germs even after washing.  Please do not use if you have an allergy to CHG or antibacterial soaps. If your skin becomes reddened/irritated stop using the CHG.  Do not shave (including legs and underarms) for at least 48 hours prior to first CHG shower. It is OK to shave your face.  Please follow these instructions carefully.   1. Shower the NIGHT BEFORE SURGERY and the MORNING OF SURGERY with CHG.   2. If you chose to wash your hair, wash your hair first as usual with your normal shampoo.  3. After you shampoo, rinse your hair and body thoroughly to remove the shampoo.  4. Use CHG as you would any other liquid soap. You can apply CHG directly to the skin and wash gently with a scrungie or a clean washcloth.   5. Apply the CHG Soap to your body ONLY FROM THE NECK DOWN.  Do not use on open wounds or open sores. Avoid contact with your eyes, ears, mouth and genitals (private parts). Wash genitals (private parts) with your normal soap.  6. Wash thoroughly, paying special attention to the area where your surgery will be performed.  7. Thoroughly rinse your body with warm water from the neck down.  8. DO NOT shower/wash with your normal soap after using and rinsing off the CHG Soap.  9. Pat yourself dry with a CLEAN TOWEL.   10. Wear CLEAN PAJAMAS   11. Place CLEAN SHEETS on your bed the night of your first shower and DO NOT SLEEP WITH PETS.  Day of Surgery: Do not apply any deodorants/lotions. Please wear clean clothes to the hospital/surgery center.

## 2016-03-30 ENCOUNTER — Encounter (HOSPITAL_COMMUNITY)
Admission: RE | Admit: 2016-03-30 | Discharge: 2016-03-30 | Disposition: A | Discharge status: Home / Self Care | Payer: Medicare Other | Source: Ambulatory Visit | Attending: Neurological Surgery | Admitting: Neurological Surgery

## 2016-03-30 ENCOUNTER — Ambulatory Visit (HOSPITAL_COMMUNITY)
Admission: RE | Admit: 2016-03-30 | Discharge: 2016-03-30 | Disposition: A | Discharge status: Home / Self Care | Payer: Medicare Other | Source: Ambulatory Visit | Attending: Neurological Surgery | Admitting: Neurological Surgery

## 2016-03-30 ENCOUNTER — Encounter (HOSPITAL_COMMUNITY): Payer: Self-pay

## 2016-03-30 DIAGNOSIS — Z79899 Other long term (current) drug therapy: Secondary | ICD-10-CM | POA: Diagnosis not present

## 2016-03-30 DIAGNOSIS — E785 Hyperlipidemia, unspecified: Secondary | ICD-10-CM | POA: Diagnosis not present

## 2016-03-30 DIAGNOSIS — Z01818 Encounter for other preprocedural examination: Secondary | ICD-10-CM | POA: Diagnosis not present

## 2016-03-30 DIAGNOSIS — E119 Type 2 diabetes mellitus without complications: Secondary | ICD-10-CM | POA: Insufficient documentation

## 2016-03-30 DIAGNOSIS — M1990 Unspecified osteoarthritis, unspecified site: Secondary | ICD-10-CM | POA: Diagnosis not present

## 2016-03-30 DIAGNOSIS — Z0183 Encounter for blood typing: Secondary | ICD-10-CM | POA: Diagnosis not present

## 2016-03-30 DIAGNOSIS — Z01812 Encounter for preprocedural laboratory examination: Secondary | ICD-10-CM | POA: Diagnosis not present

## 2016-03-30 DIAGNOSIS — M4310 Spondylolisthesis, site unspecified: Secondary | ICD-10-CM | POA: Insufficient documentation

## 2016-03-30 DIAGNOSIS — Z87891 Personal history of nicotine dependence: Secondary | ICD-10-CM | POA: Insufficient documentation

## 2016-03-30 DIAGNOSIS — I10 Essential (primary) hypertension: Secondary | ICD-10-CM | POA: Diagnosis not present

## 2016-03-30 DIAGNOSIS — Z7984 Long term (current) use of oral hypoglycemic drugs: Secondary | ICD-10-CM | POA: Insufficient documentation

## 2016-03-30 HISTORY — DX: Unspecified osteoarthritis, unspecified site: M19.90

## 2016-03-30 LAB — BASIC METABOLIC PANEL
Anion gap: 6 (ref 5–15)
BUN: 14 mg/dL (ref 6–20)
CALCIUM: 9.5 mg/dL (ref 8.9–10.3)
CO2: 27 mmol/L (ref 22–32)
Chloride: 103 mmol/L (ref 101–111)
Creatinine, Ser: 0.98 mg/dL (ref 0.61–1.24)
GFR calc Af Amer: >60 mL/min (ref >60)
GLUCOSE: 169 mg/dL — AB (ref 65–99)
Potassium: 4 mmol/L (ref 3.5–5.1)
Sodium: 136 mmol/L (ref 135–145)

## 2016-03-30 LAB — CBC WITH DIFFERENTIAL/PLATELET
BASOS ABS: 0 10*3/uL (ref 0.0–0.1)
BASOS PCT: 0 %
EOS PCT: 6 %
Eosinophils Absolute: 0.4 10*3/uL (ref 0.0–0.7)
HCT: 43.5 % (ref 39.0–52.0)
Hemoglobin: 14.6 g/dL (ref 13.0–17.0)
Lymphocytes Relative: 28 %
Lymphs Abs: 2 10*3/uL (ref 0.7–4.0)
MCH: 28 pg (ref 26.0–34.0)
MCHC: 33.6 g/dL (ref 30.0–36.0)
MCV: 83.3 fL (ref 78.0–100.0)
MONO ABS: 0.4 10*3/uL (ref 0.1–1.0)
Monocytes Relative: 5 %
Neutro Abs: 4.2 10*3/uL (ref 1.7–7.7)
Neutrophils Relative %: 61 %
PLATELETS: 160 10*3/uL (ref 150–400)
RBC: 5.22 MIL/uL (ref 4.22–5.81)
RDW: 14.2 % (ref 11.5–15.5)
WBC: 7 10*3/uL (ref 4.0–10.5)

## 2016-03-30 LAB — ABO/RH: ABO/RH(D): O POS

## 2016-03-30 LAB — PROTIME-INR
INR: 0.99
Prothrombin Time: 13.1 seconds (ref 11.4–15.2)

## 2016-03-30 LAB — TYPE AND SCREEN
ABO/RH(D): O POS
Antibody Screen: NEGATIVE

## 2016-03-30 LAB — SURGICAL PCR SCREEN
MRSA, PCR: NEGATIVE
STAPHYLOCOCCUS AUREUS: NEGATIVE

## 2016-03-30 LAB — GLUCOSE, CAPILLARY: Glucose-Capillary: 142 mg/dL — ABNORMAL HIGH (ref 65–99)

## 2016-03-30 NOTE — Progress Notes (Signed)
PCP - Dr. Jill Alexanders Cardiologist - denies  EKG - 03/30/16 CXR - 03/30/16 Echo & Stress test - requested from Dr. Sunday Corn at [facility] - denies  Patient denies chest pain and shortness of breath at PAT appointment.    Patient states that he checks his blood sugar every other day and that his fasting glucose is in the 130's.

## 2016-03-30 NOTE — Progress Notes (Addendum)
Anesthesia Chart Review: Patient is a 75 year old male scheduled for L4-5 MAS, PLIF on 04/07/16 by Dr. Ronnald Ramp.  History includes former smoker (quit '86), DM2, HLD, HTN, arthritis. PCP is Dr. Jill Alexanders. He previously lived in [place].  Meds include Lipitor, metformin, fish oil, quinapril, Januvia, thiamin.  BP (!) 153/99   Pulse 70   Temp 36.6 C   Resp 20   Ht 5\' 9"  (1.753 m)   Wt 178 lb 3.2 oz (80.8 kg)   SpO2 96%   BMI 26.32 kg/m  (Unfortunately, BP was not rechecked at PAT, but was 128/88 when he saw Dr. Redmond School on 02/29/16 and 124/80 on 10/29/15.)  03/30/16 EKG: NSR.  01/27/14 Stress Electrocardiogram ([facility]): Protocol: Bruce      Duration: 10:01 Max HR 142 Max BP 186/94  Fitness Category: Excellent % Ranking: 85  Reason for stopping: rthr Recovery HR: 91  Recovery BP 172/84      Findings: Normal Impression (as per Dr. Katherine Basset 01/27/14 office note): I do not see any ST segments, rhythm, or premature beats consistent with impaired blood supply to the heart muscle.   No echo (or mention of echo) received from Dr. Katherine Basset office.  03/30/16 CXR: IMPRESSION: No active cardiopulmonary disease.  Preoperative labs noted. A1c on 02/29/16 was 8.5 consistent with average glucose of 197. He reported fasting CBG in the 130's. A1c routed to Dr. Ronnald Ramp for post-operative hospital management purposes.   BP was elevated at PAT, but historically better at his most recent PCP visits. He will get vitals on arrival. If BP acceptable and otherwise no acute changes then I would anticipate that he could proceed as planned.  George Hugh [facility] Short Stay Center/Anesthesiology Phone [phone] 03/31/2016 3:08 PM

## 2016-04-07 ENCOUNTER — Inpatient Hospital Stay (HOSPITAL_COMMUNITY)
Admission: RE | Admit: 2016-04-07 | Discharge: 2016-04-08 | DRG: 460 | Disposition: A | Discharge status: Home / Self Care | Payer: Medicare Other | Source: Ambulatory Visit | Attending: Neurological Surgery | Admitting: Neurological Surgery

## 2016-04-07 ENCOUNTER — Inpatient Hospital Stay (HOSPITAL_COMMUNITY): Payer: Medicare Other

## 2016-04-07 ENCOUNTER — Inpatient Hospital Stay (HOSPITAL_COMMUNITY): Payer: Medicare Other | Admitting: Certified Registered Nurse Anesthetist

## 2016-04-07 ENCOUNTER — Inpatient Hospital Stay (HOSPITAL_COMMUNITY): Payer: Medicare Other | Admitting: Vascular Surgery

## 2016-04-07 ENCOUNTER — Encounter (HOSPITAL_COMMUNITY): Payer: Self-pay | Admitting: *Deleted

## 2016-04-07 ENCOUNTER — Encounter (HOSPITAL_COMMUNITY)
Admission: RE | Disposition: A | Discharge status: Home / Self Care | Payer: Self-pay | Source: Ambulatory Visit | Attending: Neurological Surgery

## 2016-04-07 DIAGNOSIS — M4806 Spinal stenosis, lumbar region: Principal | ICD-10-CM | POA: Diagnosis present

## 2016-04-07 DIAGNOSIS — Z7984 Long term (current) use of oral hypoglycemic drugs: Secondary | ICD-10-CM

## 2016-04-07 DIAGNOSIS — M419 Scoliosis, unspecified: Secondary | ICD-10-CM | POA: Diagnosis present

## 2016-04-07 DIAGNOSIS — Z87891 Personal history of nicotine dependence: Secondary | ICD-10-CM | POA: Diagnosis not present

## 2016-04-07 DIAGNOSIS — E119 Type 2 diabetes mellitus without complications: Secondary | ICD-10-CM | POA: Diagnosis present

## 2016-04-07 DIAGNOSIS — Z981 Arthrodesis status: Secondary | ICD-10-CM | POA: Diagnosis not present

## 2016-04-07 DIAGNOSIS — I10 Essential (primary) hypertension: Secondary | ICD-10-CM | POA: Diagnosis present

## 2016-04-07 DIAGNOSIS — E785 Hyperlipidemia, unspecified: Secondary | ICD-10-CM | POA: Diagnosis present

## 2016-04-07 DIAGNOSIS — M4316 Spondylolisthesis, lumbar region: Secondary | ICD-10-CM | POA: Diagnosis present

## 2016-04-07 DIAGNOSIS — Z419 Encounter for procedure for purposes other than remedying health state, unspecified: Secondary | ICD-10-CM

## 2016-04-07 DIAGNOSIS — M1990 Unspecified osteoarthritis, unspecified site: Secondary | ICD-10-CM | POA: Diagnosis not present

## 2016-04-07 DIAGNOSIS — M545 Low back pain: Secondary | ICD-10-CM | POA: Diagnosis not present

## 2016-04-07 HISTORY — PX: LUMBAR LAMINECTOMY/DECOMPRESSION MICRODISCECTOMY: SHX5026

## 2016-04-07 HISTORY — PX: MAXIMUM ACCESS (MAS)POSTERIOR LUMBAR INTERBODY FUSION (PLIF) 1 LEVEL: SHX6368

## 2016-04-07 LAB — GLUCOSE, CAPILLARY
GLUCOSE-CAPILLARY: 202 mg/dL — AB (ref 65–99)
GLUCOSE-CAPILLARY: 205 mg/dL — AB (ref 65–99)
Glucose-Capillary: 242 mg/dL — ABNORMAL HIGH (ref 65–99)
Glucose-Capillary: 287 mg/dL — ABNORMAL HIGH (ref 65–99)

## 2016-04-07 SURGERY — FOR MAXIMUM ACCESS (MAS) POSTERIOR LUMBAR INTERBODY FUSION (PLIF) 1 LEVEL
Anesthesia: General | Site: Spine Lumbar | Laterality: Right

## 2016-04-07 MED ORDER — METHOCARBAMOL 1000 MG/10ML IJ SOLN
500.0000 mg | Freq: Four times a day (QID) | INTRAVENOUS | Status: DC | PRN
  Filled 2016-04-07: qty 5

## 2016-04-07 MED ORDER — DEXAMETHASONE SODIUM PHOSPHATE 10 MG/ML IJ SOLN
10.0000 mg | INTRAMUSCULAR | Status: AC
  Administered 2016-04-07: 10 mg via INTRAVENOUS
  Filled 2016-04-07: qty 1

## 2016-04-07 MED ORDER — OXYCODONE HCL 5 MG PO TABS
5.0000 mg | ORAL_TABLET | Freq: Once | ORAL | Status: AC | PRN
  Administered 2016-04-07: 5 mg via ORAL

## 2016-04-07 MED ORDER — ONDANSETRON HCL 4 MG/2ML IJ SOLN
INTRAMUSCULAR | Status: AC
  Filled 2016-04-07: qty 2

## 2016-04-07 MED ORDER — PROPOFOL 10 MG/ML IV BOLUS
INTRAVENOUS | Status: AC
  Filled 2016-04-07: qty 20

## 2016-04-07 MED ORDER — FENTANYL CITRATE (PF) 100 MCG/2ML IJ SOLN
INTRAMUSCULAR | Status: AC
  Filled 2016-04-07: qty 2

## 2016-04-07 MED ORDER — SODIUM CHLORIDE 0.9% FLUSH: 3.0000 mL | INTRAVENOUS | Status: DC | PRN

## 2016-04-07 MED ORDER — SODIUM CHLORIDE 0.9 % IR SOLN
Status: DC | PRN
  Administered 2016-04-07: 500 mL

## 2016-04-07 MED ORDER — ACETAMINOPHEN 10 MG/ML IV SOLN
INTRAVENOUS | Status: AC
  Filled 2016-04-07: qty 100

## 2016-04-07 MED ORDER — BUPIVACAINE HCL (PF) 0.25 % IJ SOLN
INTRAMUSCULAR | Status: DC | PRN
  Administered 2016-04-07: 8 mL

## 2016-04-07 MED ORDER — PROPOFOL 10 MG/ML IV BOLUS
INTRAVENOUS | Status: DC | PRN
  Administered 2016-04-07: 150 mg via INTRAVENOUS

## 2016-04-07 MED ORDER — FENTANYL CITRATE (PF) 100 MCG/2ML IJ SOLN
INTRAMUSCULAR | Status: DC | PRN
  Administered 2016-04-07 (×2): 50 ug via INTRAVENOUS
  Administered 2016-04-07: 100 ug via INTRAVENOUS
  Administered 2016-04-07 (×2): 50 ug via INTRAVENOUS

## 2016-04-07 MED ORDER — PHENYLEPHRINE 40 MCG/ML (10ML) SYRINGE FOR IV PUSH (FOR BLOOD PRESSURE SUPPORT)
PREFILLED_SYRINGE | INTRAVENOUS | Status: AC
  Filled 2016-04-07: qty 10

## 2016-04-07 MED ORDER — ONDANSETRON HCL 4 MG/2ML IJ SOLN
INTRAMUSCULAR | Status: DC | PRN
Start: 2016-04-07 — End: 2016-04-07
  Administered 2016-04-07: 4 mg via INTRAVENOUS

## 2016-04-07 MED ORDER — VANCOMYCIN HCL 1000 MG IV SOLR
INTRAVENOUS | Status: DC | PRN
  Administered 2016-04-07: 1000 mg via TOPICAL

## 2016-04-07 MED ORDER — SUGAMMADEX SODIUM 200 MG/2ML IV SOLN
INTRAVENOUS | Status: DC | PRN
  Administered 2016-04-07: 160 mg via INTRAVENOUS

## 2016-04-07 MED ORDER — SODIUM CHLORIDE 0.9% FLUSH: 3.0000 mL | Freq: Two times a day (BID) | INTRAVENOUS | Status: DC

## 2016-04-07 MED ORDER — LIDOCAINE HCL (CARDIAC) 20 MG/ML IV SOLN
INTRAVENOUS | Status: DC | PRN
  Administered 2016-04-07: 50 mg via INTRATRACHEAL
  Administered 2016-04-07: 50 mg via INTRAVENOUS

## 2016-04-07 MED ORDER — MENTHOL 3 MG MT LOZG: 1.0000 | LOZENGE | OROMUCOSAL | Status: DC | PRN

## 2016-04-07 MED ORDER — OXYCODONE HCL 5 MG PO TABS
ORAL_TABLET | ORAL | Status: AC
  Filled 2016-04-07: qty 1

## 2016-04-07 MED ORDER — METFORMIN HCL ER 750 MG PO TB24
750.0000 mg | ORAL_TABLET | Freq: Two times a day (BID) | ORAL | Status: DC
  Administered 2016-04-07 – 2016-04-08 (×2): 750 mg via ORAL
  Filled 2016-04-07 (×3): qty 1
  Filled 2016-04-07: qty 1.5

## 2016-04-07 MED ORDER — ACETAMINOPHEN 10 MG/ML IV SOLN
INTRAVENOUS | Status: DC | PRN
  Administered 2016-04-07: 1000 mg via INTRAVENOUS

## 2016-04-07 MED ORDER — CELECOXIB 200 MG PO CAPS
200.0000 mg | ORAL_CAPSULE | Freq: Two times a day (BID) | ORAL | Status: DC
  Administered 2016-04-07 – 2016-04-08 (×2): 200 mg via ORAL
  Filled 2016-04-07 (×2): qty 1

## 2016-04-07 MED ORDER — ACETAMINOPHEN 325 MG PO TABS: 650.0000 mg | ORAL_TABLET | ORAL | Status: DC | PRN

## 2016-04-07 MED ORDER — POTASSIUM CHLORIDE IN NACL 20-0.9 MEQ/L-% IV SOLN
INTRAVENOUS | Status: DC
  Administered 2016-04-07: 18:00:00 via INTRAVENOUS
  Filled 2016-04-07 (×2): qty 1000

## 2016-04-07 MED ORDER — PHENYLEPHRINE HCL 10 MG/ML IJ SOLN
INTRAMUSCULAR | Status: DC | PRN
  Administered 2016-04-07 (×4): 80 ug via INTRAVENOUS

## 2016-04-07 MED ORDER — SUCCINYLCHOLINE CHLORIDE 200 MG/10ML IV SOSY
PREFILLED_SYRINGE | INTRAVENOUS | Status: AC
  Filled 2016-04-07: qty 10

## 2016-04-07 MED ORDER — OXYCODONE HCL 5 MG/5ML PO SOLN: 5.0000 mg | Freq: Once | ORAL | Status: AC | PRN

## 2016-04-07 MED ORDER — SODIUM CHLORIDE 0.9 % IV SOLN: 250.0000 mL | INTRAVENOUS | Status: DC

## 2016-04-07 MED ORDER — VANCOMYCIN HCL 1000 MG IV SOLR
INTRAVENOUS | Status: AC
  Filled 2016-04-07: qty 1000

## 2016-04-07 MED ORDER — PHENYLEPHRINE HCL 10 MG/ML IJ SOLN
INTRAVENOUS | Status: DC | PRN
  Administered 2016-04-07: 20 ug/min via INTRAVENOUS

## 2016-04-07 MED ORDER — PHENOL 1.4 % MT LIQD: 1.0000 | OROMUCOSAL | Status: DC | PRN

## 2016-04-07 MED ORDER — CHLORHEXIDINE GLUCONATE CLOTH 2 % EX PADS: 6.0000 | MEDICATED_PAD | Freq: Once | CUTANEOUS | Status: DC

## 2016-04-07 MED ORDER — LACTATED RINGERS IV SOLN
INTRAVENOUS | Status: DC | PRN
Start: 2016-04-07 — End: 2016-04-07
  Administered 2016-04-07 (×3): via INTRAVENOUS

## 2016-04-07 MED ORDER — THROMBIN 20000 UNITS EX SOLR
CUTANEOUS | Status: DC | PRN
  Administered 2016-04-07: 20 mL via TOPICAL

## 2016-04-07 MED ORDER — CEFAZOLIN SODIUM-DEXTROSE 2-4 GM/100ML-% IV SOLN
2.0000 g | INTRAVENOUS | Status: AC
  Administered 2016-04-07: 2 g via INTRAVENOUS
  Filled 2016-04-07: qty 100

## 2016-04-07 MED ORDER — ACETAMINOPHEN 650 MG RE SUPP: 650.0000 mg | RECTAL | Status: DC | PRN

## 2016-04-07 MED ORDER — LIDOCAINE 2% (20 MG/ML) 5 ML SYRINGE
INTRAMUSCULAR | Status: AC
  Filled 2016-04-07: qty 5

## 2016-04-07 MED ORDER — ONDANSETRON HCL 4 MG/2ML IJ SOLN: 4.0000 mg | INTRAMUSCULAR | Status: DC | PRN

## 2016-04-07 MED ORDER — CEFAZOLIN IN D5W 1 GM/50ML IV SOLN
1.0000 g | Freq: Three times a day (TID) | INTRAVENOUS | Status: AC
  Administered 2016-04-07 – 2016-04-08 (×2): 1 g via INTRAVENOUS
  Filled 2016-04-07 (×2): qty 50

## 2016-04-07 MED ORDER — 0.9 % SODIUM CHLORIDE (POUR BTL) OPTIME
TOPICAL | Status: DC | PRN
  Administered 2016-04-07: 1000 mL

## 2016-04-07 MED ORDER — METHOCARBAMOL 500 MG PO TABS: 500.0000 mg | ORAL_TABLET | Freq: Four times a day (QID) | ORAL | Status: DC | PRN

## 2016-04-07 MED ORDER — QUINAPRIL HCL 10 MG PO TABS
40.0000 mg | ORAL_TABLET | Freq: Every day | ORAL | Status: DC
  Filled 2016-04-07 (×2): qty 4

## 2016-04-07 MED ORDER — SUGAMMADEX SODIUM 500 MG/5ML IV SOLN
INTRAVENOUS | Status: AC
  Filled 2016-04-07: qty 5

## 2016-04-07 MED ORDER — ROCURONIUM BROMIDE 10 MG/ML (PF) SYRINGE
PREFILLED_SYRINGE | INTRAVENOUS | Status: AC
  Filled 2016-04-07: qty 10

## 2016-04-07 MED ORDER — MORPHINE SULFATE (PF) 2 MG/ML IV SOLN: 1.0000 mg | INTRAVENOUS | Status: DC | PRN

## 2016-04-07 MED ORDER — FENTANYL CITRATE (PF) 100 MCG/2ML IJ SOLN
25.0000 ug | INTRAMUSCULAR | Status: DC | PRN
  Administered 2016-04-07: 25 ug via INTRAVENOUS
  Administered 2016-04-07: 50 ug via INTRAVENOUS
  Administered 2016-04-07: 25 ug via INTRAVENOUS

## 2016-04-07 MED ORDER — THROMBIN 5000 UNITS EX SOLR
OROMUCOSAL | Status: DC | PRN
  Administered 2016-04-07: 5 mL via TOPICAL

## 2016-04-07 MED ORDER — OXYCODONE-ACETAMINOPHEN 5-325 MG PO TABS
1.0000 | ORAL_TABLET | ORAL | Status: DC | PRN
  Administered 2016-04-07 – 2016-04-08 (×5): 1 via ORAL
  Filled 2016-04-07: qty 1
  Filled 2016-04-07: qty 2
  Filled 2016-04-07 (×2): qty 1
  Filled 2016-04-07: qty 2

## 2016-04-07 MED ORDER — ROCURONIUM BROMIDE 100 MG/10ML IV SOLN
INTRAVENOUS | Status: DC | PRN
  Administered 2016-04-07: 50 mg via INTRAVENOUS

## 2016-04-07 MED ORDER — ONDANSETRON HCL 4 MG/2ML IJ SOLN: 4.0000 mg | Freq: Once | INTRAMUSCULAR | Status: DC | PRN

## 2016-04-07 SURGICAL SUPPLY — 73 items
BAG DECANTER FOR FLEXI CONT (MISCELLANEOUS) ×4 IMPLANT
BENZOIN TINCTURE PRP APPL 2/3 (GAUZE/BANDAGES/DRESSINGS) ×4 IMPLANT
BIT DRILL PLIF MAS 5.0MM DISP (DRILL) ×2 IMPLANT
BLADE CLIPPER SURG (BLADE) ×4 IMPLANT
BONE CANC CHIPS 40CC CAN1/2 (Bone Implant) ×4 IMPLANT
BUR MATCHSTICK NEURO 3.0 LAGG (BURR) ×8 IMPLANT
CANISTER SUCT 3000ML PPV (MISCELLANEOUS) ×4 IMPLANT
CAP RELINE MOD TULIP RMM (Cap) ×8 IMPLANT
CHIPS CANC BONE 40CC CAN1/2 (Bone Implant) ×2 IMPLANT
CLIP NEUROVISION LG (CLIP) ×4 IMPLANT
CLOSURE WOUND 1/2 X4 (GAUZE/BANDAGES/DRESSINGS) ×1
CONT SPEC 4OZ CLIKSEAL STRL BL (MISCELLANEOUS) ×8 IMPLANT
COVER BACK TABLE 24X17X13 BIG (DRAPES) IMPLANT
COVER BACK TABLE 60X90IN (DRAPES) ×4 IMPLANT
DERMABOND ADVANCED (GAUZE/BANDAGES/DRESSINGS) ×2
DERMABOND ADVANCED .7 DNX12 (GAUZE/BANDAGES/DRESSINGS) ×2 IMPLANT
DRAPE C-ARM 42X72 X-RAY (DRAPES) ×4 IMPLANT
DRAPE C-ARMOR (DRAPES) ×4 IMPLANT
DRAPE LAPAROTOMY 100X72X124 (DRAPES) ×4 IMPLANT
DRAPE MICROSCOPE LEICA (MISCELLANEOUS) IMPLANT
DRAPE POUCH INSTRU U-SHP 10X18 (DRAPES) ×4 IMPLANT
DRAPE SURG 17X23 STRL (DRAPES) ×4 IMPLANT
DRILL PLIF MAS 5.0MM DISP (DRILL) ×4
DRSG OPSITE 4X5.5 SM (GAUZE/BANDAGES/DRESSINGS) ×4 IMPLANT
DRSG OPSITE POSTOP 4X8 (GAUZE/BANDAGES/DRESSINGS) ×4 IMPLANT
DURAPREP 26ML APPLICATOR (WOUND CARE) ×4 IMPLANT
ELECT REM PT RETURN 9FT ADLT (ELECTROSURGICAL) ×4
ELECTRODE REM PT RTRN 9FT ADLT (ELECTROSURGICAL) ×2 IMPLANT
EVACUATOR 1/8 PVC DRAIN (DRAIN) ×4 IMPLANT
GAUZE SPONGE 4X4 16PLY XRAY LF (GAUZE/BANDAGES/DRESSINGS) IMPLANT
GLOVE BIO SURGEON STRL SZ8 (GLOVE) ×16 IMPLANT
GLOVE BIOGEL PI IND STRL 7.5 (GLOVE) ×4 IMPLANT
GLOVE BIOGEL PI IND STRL 8 (GLOVE) ×4 IMPLANT
GLOVE BIOGEL PI IND STRL 8.5 (GLOVE) ×2 IMPLANT
GLOVE BIOGEL PI INDICATOR 7.5 (GLOVE) ×4
GLOVE BIOGEL PI INDICATOR 8 (GLOVE) ×4
GLOVE BIOGEL PI INDICATOR 8.5 (GLOVE) ×2
GLOVE ECLIPSE 7.5 STRL STRAW (GLOVE) ×12 IMPLANT
GOWN STRL REUS W/ TWL LRG LVL3 (GOWN DISPOSABLE) IMPLANT
GOWN STRL REUS W/ TWL XL LVL3 (GOWN DISPOSABLE) ×6 IMPLANT
GOWN STRL REUS W/TWL 2XL LVL3 (GOWN DISPOSABLE) ×4 IMPLANT
GOWN STRL REUS W/TWL LRG LVL3 (GOWN DISPOSABLE)
GOWN STRL REUS W/TWL XL LVL3 (GOWN DISPOSABLE) ×6
HEMOSTAT POWDER KIT SURGIFOAM (HEMOSTASIS) IMPLANT
KIT BASIN OR (CUSTOM PROCEDURE TRAY) ×4 IMPLANT
KIT ROOM TURNOVER OR (KITS) ×4 IMPLANT
MARKER SKIN DUAL TIP RULER LAB (MISCELLANEOUS) ×4 IMPLANT
MILL MEDIUM DISP (BLADE) IMPLANT
MODULE NVM5 NEXT GEN EMG (NEEDLE) ×4 IMPLANT
NEEDLE HYPO 25X1 1.5 SAFETY (NEEDLE) ×4 IMPLANT
NEEDLE SPNL 20GX3.5 QUINCKE YW (NEEDLE) IMPLANT
NS IRRIG 1000ML POUR BTL (IV SOLUTION) ×4 IMPLANT
PACK LAMINECTOMY NEURO (CUSTOM PROCEDURE TRAY) ×4 IMPLANT
PAD ARMBOARD 7.5X6 YLW CONV (MISCELLANEOUS) ×16 IMPLANT
ROD RELINE COCR LORD 5.0X25 (Rod) ×6 IMPLANT
RUBBERBAND STERILE (MISCELLANEOUS) IMPLANT
SCREW LOCK RSS 4.5/5.0MM (Screw) ×16 IMPLANT
SCREW RELINE RMM 5.0X35MM 4S (Screw) ×8 IMPLANT
SCREW SHANK RELINE MOD 5.0X35 (Screw) ×8 IMPLANT
SPONGE LAP 4X18 X RAY DECT (DISPOSABLE) IMPLANT
SPONGE SURGIFOAM ABS GEL 100 (HEMOSTASIS) ×4 IMPLANT
SPONGE SURGIFOAM ABS GEL SZ50 (HEMOSTASIS) IMPLANT
STRIP CLOSURE SKIN 1/2X4 (GAUZE/BANDAGES/DRESSINGS) ×3 IMPLANT
SUT VIC AB 0 CT1 18XCR BRD8 (SUTURE) ×4 IMPLANT
SUT VIC AB 0 CT1 8-18 (SUTURE) ×4
SUT VIC AB 2-0 CP2 18 (SUTURE) ×8 IMPLANT
SUT VIC AB 3-0 SH 8-18 (SUTURE) ×8 IMPLANT
SYR 3ML LL SCALE MARK (SYRINGE) IMPLANT
TOWEL OR 17X24 6PK STRL BLUE (TOWEL DISPOSABLE) ×4 IMPLANT
TOWEL OR 17X26 10 PK STRL BLUE (TOWEL DISPOSABLE) ×4 IMPLANT
TRAP SPECIMEN MUCOUS 40CC (MISCELLANEOUS) ×4 IMPLANT
TRAY FOLEY W/METER SILVER 16FR (SET/KITS/TRAYS/PACK) ×4 IMPLANT
WATER STERILE IRR 1000ML POUR (IV SOLUTION) ×4 IMPLANT

## 2016-04-07 NOTE — Anesthesia Postprocedure Evaluation (Signed)
Anesthesia Post Note  Patient: Donterrio Murdough  Procedure(s) Performed: Procedure(s) (LRB): lumbar two-three, three-four, four-five laminectomy lumbar four-five fusion with pedicle screws (PLIF without interbodies) (N/A) hemilaminectomy  - Lumbar tow-three - Lumbar three-four - right (Right)  Patient location during evaluation: PACU Anesthesia Type: General Level of consciousness: awake, awake and alert and oriented Pain management: pain level controlled Vital Signs Assessment: post-procedure vital signs reviewed and stable Respiratory status: spontaneous breathing, nonlabored ventilation and respiratory function stable Cardiovascular status: blood pressure returned to baseline Anesthetic complications: no    Last Vitals:  Vitals:   04/07/16 1522 04/07/16 1614  BP:  114/85  Pulse:  79  Resp:    Temp: 36.2 C 36.2 C    Last Pain:  Vitals:   04/07/16 1614  TempSrc: Oral  PainSc:                  Rajveer Handler COKER

## 2016-04-07 NOTE — Transfer of Care (Signed)
Immediate Anesthesia Transfer of Care Note  Patient: Scott Parks  Procedure(s) Performed: Procedure(s): lumbar two-three, three-four, four-five laminectomy lumbar four-five fusion with pedicle screws (PLIF without interbodies) (N/A) hemilaminectomy  - Lumbar tow-three - Lumbar three-four - right (Right)  Patient Location: PACU  Anesthesia Type:General  Level of Consciousness: awake, alert , oriented and patient cooperative  Airway & Oxygen Therapy: Patient Spontanous Breathing and Patient connected to nasal cannula oxygen  Post-op Assessment: Report given to RN and Post -op Vital signs reviewed and stable  Post vital signs: Reviewed and stable  Last Vitals:  Vitals:   04/07/16 0554  BP: (!) 160/93  Pulse: 66  Resp: 20  Temp: 36.8 C    Last Pain:  Vitals:   04/07/16 0554  TempSrc: Oral  PainSc:       Patients Stated Pain Goal: 3 ([id number])  Complications: No apparent anesthesia complications

## 2016-04-07 NOTE — H&P (Signed)
Subjective: Patient is a 75 y.o. male admitted for spinal stenosis. Onset of symptoms was several years ago, gradually worsening since that time.  The pain is rated severe, intense, and is located at the across the lower back and radiates to right leg. The pain is described as aching and occurs all day. The symptoms have been progressive. Symptoms are exacerbated by exercise. MRI or CT showed spondylolisthesis with stenosis and scoliosis.   Past Medical History:  Diagnosis Date  . Arthritis   . Diabetes mellitus without complication ([place])    Type II  . Hyperlipidemia   . Hypertension     Past Surgical History:  Procedure Laterality Date  . COLONOSCOPY    . HAND SURGERY Right   . KNEE ARTHROSCOPY Left   . TRIGGER FINGER RELEASE Left    ring finger    Prior to Admission medications   Medication Sig Start Date End Date Taking? Authorizing Provider  atorvastatin (LIPITOR) 40 MG tablet Take 1 tablet (40 mg total) by mouth daily. 02/29/16  Yes Denita Lung, MD  Calcium Carbonate-Vitamin D (CALCIUM + D PO) Take 1 tablet by mouth daily.    Yes Historical Provider, MD  glucose blood test strip Needs Test strips and lancets for a ONE TOUCH VERIO IQ MACHINE 11/06/13  Yes Camelia Eng Tysinger, PA-C  metFORMIN (GLUCOPHAGE-XR) 750 MG 24 hr tablet Take 750 mg by mouth 2 (two) times daily.   Yes Historical Provider, MD  Omega-3 Fatty Acids (FISH OIL) 1000 MG CAPS Take 1 capsule by mouth daily.    Yes Historical Provider, MD  quinapril (ACCUPRIL) 40 MG tablet Take 1 tablet (40 mg total) by mouth daily. 02/29/16  Yes Denita Lung, MD  thiamine (VITAMIN B-1) 100 MG tablet Take 100 mg by mouth daily.   Yes Historical Provider, MD  metFORMIN (GLUCOPHAGE) 1000 MG tablet Take 1 tablet (1,000 mg total) by mouth 2 (two) times daily with a meal. Patient not taking: Reported on 03/28/2016 02/29/16   Denita Lung, MD  sitaGLIPtin (JANUVIA) 100 MG tablet Take 1 tablet (100 mg total) by mouth daily. Patient not  taking: Reported on 03/28/2016 02/29/16   Denita Lung, MD   Allergies  Allergen Reactions  . No Known Allergies     Social History  Substance Use Topics  . Smoking status: Former Smoker    Quit date: 08/15/1984  . Smokeless tobacco: Never Used  . Alcohol use No    History reviewed. No pertinent family history.   Review of Systems  Positive ROS: neg  All other systems have been reviewed and were otherwise negative with the exception of those mentioned in the HPI and as above.  Objective: Vital signs in last 24 hours: Temp:  [98.2 F (36.8 C)] 98.2 F (36.8 C) (08/24 0554) Pulse Rate:  [66] 66 (08/24 0554) Resp:  [20] 20 (08/24 0554) BP: (160)/(93) 160/93 (08/24 0554) SpO2:  [98 %] 98 % (08/24 0554) Weight:  [80.7 kg (178 lb)] 80.7 kg (178 lb) (08/24 0549)  General Appearance: Alert, cooperative, no distress, appears stated age Head: Normocephalic, without obvious abnormality, atraumatic Eyes: PERRL, conjunctiva/corneas clear, EOM's intact    Neck: Supple, symmetrical, trachea midline Back: Symmetric, no curvature, ROM normal, no CVA tenderness Lungs:  respirations unlabored Heart: Regular rate and rhythm Abdomen: Soft, non-tender Extremities: Extremities normal, atraumatic, no cyanosis or edema Pulses: 2+ and symmetric all extremities Skin: Skin color, texture, turgor normal, no rashes or lesions  NEUROLOGIC:   Mental  status: Alert and oriented x4,  no aphasia, good attention span, fund of knowledge, and memory Motor Exam - grossly normal Sensory Exam - grossly normal Reflexes: 1+ Coordination - grossly normal Gait - grossly normal Balance - grossly normal Cranial Nerves: I: smell Not tested  II: visual acuity  OS: nl    OD: nl  II: visual fields Full to confrontation  II: pupils Equal, round, reactive to light  III,VII: ptosis None  III,IV,VI: extraocular muscles  Full ROM  V: mastication Normal  V: facial light touch sensation  Normal  V,VII: corneal  reflex  Present  VII: facial muscle function - upper  Normal  VII: facial muscle function - lower Normal  VIII: hearing Not tested  IX: soft palate elevation  Normal  IX,X: gag reflex Present  XI: trapezius strength  5/5  XI: sternocleidomastoid strength 5/5  XI: neck flexion strength  5/5  XII: tongue strength  Normal    Data Review Lab Results  Component Value Date   WBC 7.0 03/30/2016   HGB 14.6 03/30/2016   HCT 43.5 03/30/2016   MCV 83.3 03/30/2016   PLT 160 03/30/2016   Lab Results  Component Value Date   NA 136 03/30/2016   K 4.0 03/30/2016   CL 103 03/30/2016   CO2 27 03/30/2016   BUN 14 03/30/2016   CREATININE 0.98 03/30/2016   GLUCOSE 169 (H) 03/30/2016   Lab Results  Component Value Date   INR 0.99 03/30/2016    Assessment/Plan: Patient admitted for PLIF plus DLL. Patient has failed a reasonable attempt at conservative therapy.  I explained the condition and procedure to the patient and answered any questions.  Patient wishes to proceed with procedure as planned. Understands risks/ benefits and typical outcomes of procedure.   Braylea Brancato S 04/07/2016 6:30 AM

## 2016-04-07 NOTE — Op Note (Signed)
04/07/2016  11:35 AM  PATIENT:  Scott Parks  75 y.o. male  PRE-OPERATIVE DIAGNOSIS:  Lumbar spinal stenosis L2-3 L3-4 L4-5, spondylolisthesis L4-5, scoliosis, back and right leg pain  POST-OPERATIVE DIAGNOSIS:  Same  PROCEDURE:   1. Decompressive lumbar laminectomy L2-3 L3-4 on the right and L4-5 bilaterally in order to adequately decompress the neural elements and address the spinal stenosis 2. Posterior fixation L4-5 using cortical pedicle screws.  4. Intertransverse arthrodesis L2-L5 bilateral using morcellized autograft and allograft.  SURGEON:  Sherley Bounds, MD  ASSISTANTS: Dr. Vertell Limber  ANESTHESIA:  General  EBL: 450 ml  Total I/O In: 2000 [I.V.:2000] Out: 36 [Urine:210; Blood:450]  BLOOD ADMINISTERED:none  DRAINS: Hemovac   INDICATION FOR PROCEDURE: This patient presented with a long history of back and right leg pain. MRI and CT scan showed scoliosis with a spondylolysis ASIS at L4-5 with spinal stenosis at L2-3 L3-4 and L4-5. He tried medical management without relief. I recommended decompression L2-3 and L3-4 with decompression and instrument effusion L4-L5. Patient understood the risks, benefits, and alternatives and potential outcomes and wished to proceed.  PROCEDURE DETAILS:  The patient was brought to the operating room. After induction of generalized endotracheal anesthesia the patient was rolled into the prone position on chest rolls and all pressure points were padded. The patient's lumbar region was cleaned and then prepped with DuraPrep and draped in the usual sterile fashion. Anesthesia was injected and then a dorsal midline incision was made and carried down to the lumbosacral fascia. The fascia was opened and the paraspinous musculature was taken down in a subperiosteal fashion to expose L2-3 L3-4 and L4-5. A self-retaining retractor was placed. Intraoperative fluoroscopy confirmed my level, and I started with placement of the L4 cortical pedicle screws. The  pedicle screw entry zones were identified utilizing surface landmarks and  AP and lateral fluoroscopy. I scored the cortex with the high-speed drill and then used the hand drill and EMG monitoring to drill an upward and outward direction into the pedicle. I then tapped line to line, and the tap was also monitored. I then placed a 5-0 x 35 mm cortical pedicle screw into the pedicles of L4 bilaterally. I then turned my attention to the decompression and the spinous process was removed and complete lumbar laminectomies, hemi- facetectomies, and foraminotomies were performed at L4-5. The patient had significant spinal stenosis and this required more work than would be required for a simple exposure of the disc for posterior lumbar interbody fusion. Much more generous decompression was undertaken in order to adequately decompress the neural elements and address the patient's leg pain. The yellow ligament was removed to expose the underlying dura and nerve roots, and generous foraminotomies were performed to adequately decompress the neural elements. Both the exiting and traversing nerve roots were decompressed on both sides until a coronary dilator passed easily along the nerve roots. I also performed right L2-3 and L3-4 hemi-laminectomy, medial facetectomy, and foraminotomy. Once the decompression was complete, I turned my attention to the posterior lower lumbar interbody fusion. The epidural venous vasculature was coagulated and cut sharply. Because of the patient's anatomy did not feel safe to perform an interbody fusion. Therefore this was aborted and I decided to move only to posterior lateral fusion. We then turned our attention to the placement of the lower pedicle screws. The pedicle screw entry zones were identified utilizing surface landmarks and fluoroscopy. I drilled into each pedicle utilizing the hand drill and EMG monitoring, and tapped each pedicle with  the appropriate tap. We palpated with a ball probe  to assure no break in the cortex. We then placed 5-0 x 35 mm cortical pedicle screws into the pedicles bilaterally at L5. We then decorticated the transverse processes and laid a mixture of morcellized autograft and allograft out over these to perform intertransverse arthrodesis at L2-L5. We then placed lordotic rods into the multiaxial screw heads of the pedicle screws and locked these in position with the locking caps and anti-torque device. We then checked our construct with AP and lateral fluoroscopy. Irrigated with copious amounts of bacitracin-containing saline solution. Placed a medium Hemovac drain through separate stab incision. Inspected the nerve roots once again to assure adequate decompression, lined to the dura with Gelfoam, and closed the muscle and the fascia with 0 Vicryl. Closed the subcutaneous tissues with 2-0 Vicryl and subcuticular tissues with 3-0 Vicryl. The skin was closed with benzoin and Steri-Strips. Dressing was then applied, the patient was awakened from general anesthesia and transported to the recovery room in stable condition. At the end of the procedure all sponge, needle and instrument counts were correct.   PLAN OF CARE: Admit to inpatient   PATIENT DISPOSITION:  PACU - hemodynamically stable.   Delay start of Pharmacological VTE agent (>24hrs) due to surgical blood loss or risk of bleeding:  yes

## 2016-04-07 NOTE — Anesthesia Procedure Notes (Signed)
Procedure Name: Intubation Date/Time: 04/07/2016 7:40 AM Performed by: Shirlyn Goltz Pre-anesthesia Checklist: Patient identified, Emergency Drugs available, Suction available and Patient being monitored Patient Re-evaluated:Patient Re-evaluated prior to inductionOxygen Delivery Method: Circle system utilized Preoxygenation: Pre-oxygenation with 100% oxygen Intubation Type: IV induction Ventilation: Mask ventilation without difficulty and Oral airway inserted - appropriate to patient size Tube type: Oral Tube size: 7.5 mm Number of attempts: 2 Airway Equipment and Method: Stylet and Video-laryngoscopy Placement Confirmation: ETT inserted through vocal cords under direct vision,  positive ETCO2 and breath sounds checked- equal and bilateral Secured at: 22 cm Tube secured with: Tape Dental Injury: Injury to lip  Difficulty Due To: Difficulty was anticipated and Difficult Airway- due to reduced neck mobility Comments: Pt with significant reduced neck extension.  DL mac 3 grade 4 view.  glidescope large grade 1 view

## 2016-04-07 NOTE — Anesthesia Preprocedure Evaluation (Addendum)
Anesthesia Evaluation  Patient identified by MRN, date of birth, ID band Patient awake    Reviewed: Allergy & Precautions, NPO status , Patient's Chart, lab work & pertinent test results  Airway Mallampati: II  TM Distance: >3 FB Neck ROM: Full    Dental  (+) Teeth Intact, Dental Advisory Given   Pulmonary former smoker,    breath sounds clear to auscultation       Cardiovascular hypertension,  Rhythm:Regular Rate:Normal     Neuro/Psych    GI/Hepatic   Endo/Other  diabetes  Renal/GU      Musculoskeletal   Abdominal   Peds  Hematology   Anesthesia Other Findings   Reproductive/Obstetrics                             Anesthesia Physical Anesthesia Plan  ASA: III  Anesthesia Plan: General   Post-op Pain Management:    Induction: Intravenous  Airway Management Planned: Oral ETT  Additional Equipment:   Intra-op Plan:   Post-operative Plan: Extubation in OR  Informed Consent: I have reviewed the patients History and Physical, chart, labs and discussed the procedure including the risks, benefits and alternatives for the proposed anesthesia with the patient or authorized representative who has indicated his/her understanding and acceptance.   Dental advisory given  Plan Discussed with: CRNA and Anesthesiologist  Anesthesia Plan Comments:         Anesthesia Quick Evaluation  

## 2016-04-08 ENCOUNTER — Encounter (HOSPITAL_COMMUNITY): Payer: Self-pay | Admitting: Neurological Surgery

## 2016-04-08 LAB — GLUCOSE, CAPILLARY: GLUCOSE-CAPILLARY: 182 mg/dL — AB (ref 65–99)

## 2016-04-08 MED ORDER — OXYCODONE-ACETAMINOPHEN 5-325 MG PO TABS: 1.0000 | ORAL_TABLET | Freq: Four times a day (QID) | ORAL | 0 refills | Status: DC | PRN

## 2016-04-08 MED ORDER — INSULIN ASPART 100 UNIT/ML ~~LOC~~ SOLN
0.0000 [IU] | Freq: Three times a day (TID) | SUBCUTANEOUS | Status: DC
  Administered 2016-04-08: 3 [IU] via SUBCUTANEOUS

## 2016-04-08 MED ORDER — METHOCARBAMOL 500 MG PO TABS: 500.0000 mg | ORAL_TABLET | Freq: Four times a day (QID) | ORAL | 1 refills | Status: DC | PRN

## 2016-04-08 NOTE — Discharge Summary (Signed)
Physician Discharge Summary  Patient ID: Scott Parks MRN: [id number] DOB/AGE: 20-Mar-1941 75 y.o.  Admit date: 04/07/2016 Discharge date: 04/08/2016  Admission Diagnoses: spinal stenosis with spondylolisthesis    Discharge Diagnoses: same   Discharged Condition: good  Hospital Course: The patient was admitted on 04/07/2016 and taken to the operating room where the patient underwent LL/ fusion L2-5. The patient tolerated the procedure well and was taken to the recovery room and then to the floor in stable condition. The hospital course was routine. There were no complications. The wound remained clean dry and intact. Pt had appropriate back soreness. No complaints of leg pain or new N/T/W. The patient remained afebrile with stable vital signs, and tolerated a regular diet. The patient continued to increase activities, and pain was well controlled with oral pain medications.   Consults: None  Significant Diagnostic Studies:  Results for orders placed or performed during the hospital encounter of 04/07/16  Glucose, capillary  Result Value Ref Range   Glucose-Capillary 202 (H) 65 - 99 mg/dL  Glucose, capillary  Result Value Ref Range   Glucose-Capillary 205 (H) 65 - 99 mg/dL  Glucose, capillary  Result Value Ref Range   Glucose-Capillary 242 (H) 65 - 99 mg/dL  Glucose, capillary  Result Value Ref Range   Glucose-Capillary 287 (H) 65 - 99 mg/dL   Comment 1 Notify RN    Comment 2 Document in Chart   Glucose, capillary  Result Value Ref Range   Glucose-Capillary 182 (H) 65 - 99 mg/dL   Comment 1 Notify RN    Comment 2 Document in Chart     Chest 2 View  Result Date: 03/30/2016 CLINICAL DATA:  Preop lumbar surgery.  Hypertension, diabetes. EXAM: CHEST  2 VIEW COMPARISON:  None. FINDINGS: Heart and mediastinal contours are within normal limits. No focal opacities or effusions. No acute bony abnormality. IMPRESSION: No active cardiopulmonary disease. Electronically Signed   By:  Rolm Baptise M.D.   On: 03/30/2016 09:02   Dg Lumbar Spine 2-3 Views  Result Date: 04/07/2016 CLINICAL DATA:  L4-5 PLIF EXAM: LUMBAR SPINE - 2-3 VIEW; DG C-ARM 61-120 MIN COMPARISON:  None. FLUOROSCOPY TIME:  Radiation Exposure Index (as provided by the fluoroscopic device): Not available If the device does not provide the exposure index: Fluoroscopy Time:  29 seconds Number of Acquired Images:  3 FINDINGS: Pedicle screws are noted at L4 and L5 with posterior fixation. IMPRESSION: Changes consistent with L4-5 fusion. Electronically Signed   By: Inez Catalina M.D.   On: 04/07/2016 11:21   Dg C-arm 1-60 Min  Result Date: 04/07/2016 CLINICAL DATA:  L4-5 PLIF EXAM: LUMBAR SPINE - 2-3 VIEW; DG C-ARM 61-120 MIN COMPARISON:  None. FLUOROSCOPY TIME:  Radiation Exposure Index (as provided by the fluoroscopic device): Not available If the device does not provide the exposure index: Fluoroscopy Time:  29 seconds Number of Acquired Images:  3 FINDINGS: Pedicle screws are noted at L4 and L5 with posterior fixation. IMPRESSION: Changes consistent with L4-5 fusion. Electronically Signed   By: Inez Catalina M.D.   On: 04/07/2016 11:21    Antibiotics:  Anti-infectives    Start     Dose/Rate Route Frequency Ordered Stop   04/07/16 1730  ceFAZolin (ANCEF) IVPB 1 g/50 mL premix     1 g 100 mL/hr over 30 Minutes Intravenous Every 8 hours 04/07/16 1546 04/08/16 0135   04/07/16 1106  vancomycin (VANCOCIN) powder  Status:  Discontinued       As needed 04/07/16  1107 04/07/16 1139   04/07/16 0843  bacitracin 50,000 Units in sodium chloride irrigation 0.9 % 500 mL irrigation  Status:  Discontinued       As needed 04/07/16 0843 04/07/16 1139   04/07/16 0710  vancomycin (VANCOCIN) 1000 MG powder    Comments:  Christell Constant   : cabinet override      04/07/16 0710 04/07/16 1914   04/07/16 0533  ceFAZolin (ANCEF) IVPB 2g/100 mL premix     2 g 200 mL/hr over 30 Minutes Intravenous On call to O.R. 04/07/16 0533 04/07/16 0800       Discharge Exam: Blood pressure 108/64, pulse 78, temperature 98.5 F (36.9 C), temperature source Oral, resp. rate 20, height 5\' 9"  (1.753 m), weight 80.7 kg (178 lb), SpO2 98 %. Neurologic: Grossly normal Dressing dry  Discharge Medications:     Medication List    TAKE these medications   atorvastatin 40 MG tablet Commonly known as:  LIPITOR Take 1 tablet (40 mg total) by mouth daily.   CALCIUM + D PO Take 1 tablet by mouth daily.   Fish Oil 1000 MG Caps Take 1 capsule by mouth daily.   glucose blood test strip Needs Test strips and lancets for a ONE TOUCH VERIO IQ MACHINE   metFORMIN 750 MG 24 hr tablet Commonly known as:  GLUCOPHAGE-XR Take 750 mg by mouth 2 (two) times daily.   metFORMIN 1000 MG tablet Commonly known as:  GLUCOPHAGE Take 1 tablet (1,000 mg total) by mouth 2 (two) times daily with a meal.   methocarbamol 500 MG tablet Commonly known as:  ROBAXIN Take 1 tablet (500 mg total) by mouth every 6 (six) hours as needed for muscle spasms.   oxyCODONE-acetaminophen 5-325 MG tablet Commonly known as:  PERCOCET/ROXICET Take 1-2 tablets by mouth every 6 (six) hours as needed for moderate pain.   quinapril 40 MG tablet Commonly known as:  ACCUPRIL Take 1 tablet (40 mg total) by mouth daily.   sitaGLIPtin 100 MG tablet Commonly known as:  JANUVIA Take 1 tablet (100 mg total) by mouth daily.   thiamine 100 MG tablet Commonly known as:  VITAMIN B-1 Take 100 mg by mouth daily.       Disposition: home   Final Dx: LL/ fusion  Discharge Instructions     Remove dressing in 72 hours    Complete by:  As directed   Call MD for:  difficulty breathing, headache or visual disturbances    Complete by:  As directed   Call MD for:  persistant nausea and vomiting    Complete by:  As directed   Call MD for:  redness, tenderness, or signs of infection (pain, swelling, redness, odor or green/yellow discharge around incision site)    Complete by:  As directed    Call MD for:  severe uncontrolled pain    Complete by:  As directed   Call MD for:  temperature >100.4    Complete by:  As directed   Diet - low sodium heart healthy    Complete by:  As directed   Discharge instructions    Complete by:  As directed   May shower, no driving or bending  Or lifting   Increase activity slowly    Complete by:  As directed         Signed: Jr Milliron S 04/08/2016, 8:57 AM

## 2016-04-08 NOTE — Evaluation (Signed)
Occupational Therapy Evaluation Patient Details Name: Scott Parks MRN: [id number] DOB: 20-Jul-1941 Today's Date: 04/08/2016    History of Present Illness Lumbar laminectomy 2-3, 3-4, 4-5,  lumbar fusion 4-5 PLIF without interband, R hemilaminectomy L2-3, 3-4. Pt. PMH: OA,    Clinical Impression   Pt. Was ed on adherring to back precautions and performing ADLs, bed mobility, and transfers. Pt. Was able to return demo correctly. Pt. Has needed DME. Pt. Was ed on use of AE for LE ADLs and will purchase. Pt. Is d/cing today and does not need followup.     Follow Up Recommendations  No OT follow up    Equipment Recommendations  None recommended by OT    Recommendations for Other Services       Precautions / Restrictions Precautions Precautions: Back;Fall Precaution Comments: Pt. ed on back precautions for ADLs, bed mobility, and transfers.  Required Braces or Orthoses: Spinal Brace Restrictions Weight Bearing Restrictions: No Other Position/Activity Restrictions: Don brace in sitting.       Mobility Bed Mobility Overal bed mobility: Modified Independent                Transfers Overall transfer level: Needs assistance   Transfers: Sit to/from Stand Sit to Stand: Supervision         General transfer comment: Pt. cues for proper hand placement and adherring to back precautions.     Balance                                            ADL Overall ADL's : Needs assistance/impaired         Upper Body Bathing: Supervision/ safety;Set up   Lower Body Bathing: Supervison/ safety;Set up;Cueing for safety   Upper Body Dressing : Supervision/safety;Set up   Lower Body Dressing: Supervision/safety;Set up   Toilet Transfer: Supervision/safety;Cueing for safety;BSC   Toileting- Clothing Manipulation and Hygiene: Supervision/safety;Set up       Functional mobility during ADLs: Supervision/safety General ADL Comments: Pt. ed on performing  ADLs  adherring to back precautions.      Vision     Perception     Praxis      Pertinent Vitals/Pain Pain Assessment: 0-10 Pain Score: 4  Pain Location:  (constant) Pain Intervention(s): Premedicated before session     Hand Dominance Right   Extremity/Trunk Assessment Upper Extremity Assessment Upper Extremity Assessment: Overall WFL for tasks assessed           Communication Communication Communication: No difficulties   Cognition Arousal/Alertness: Awake/alert Behavior During Therapy: WFL for tasks assessed/performed Overall Cognitive Status: Within Functional Limits for tasks assessed                     General Comments       Exercises       Shoulder Instructions      Home Living Family/patient expects to be discharged to:: Private residence Living Arrangements: Spouse/significant other Available Help at Discharge: Family;Available 24 hours/day Type of Home: Apartment Home Access: Stairs to enter [employer] of Steps:  (3) Entrance Stairs-Rails: Right;Left;Can reach both Home Layout: Able to live on main level with bedroom/bathroom     Bathroom Shower/Tub: Tub/shower unit Shower/tub characteristics: Curtain Biochemist, clinical: Standard     Home Equipment: Environmental consultant - 2 wheels;Shower seat;Toilet riser          Prior Functioning/Environment Level of Independence:  Independent             OT Diagnosis: Generalized weakness   OT Problem List:     OT Treatment/Interventions:      OT Goals(Current goals can be found in the care plan section)    OT Frequency:     Barriers to D/C:            Co-evaluation              End of Session Equipment Utilized During Treatment: Gait belt;Rolling walker;Back brace  Activity Tolerance: Patient tolerated treatment well Patient left: in bed   Time: 0820-0914 OT Time Calculation (min): 54 min Charges:  OT General Charges $OT Visit: 1 Procedure OT Evaluation $OT Eval  Moderate Complexity: 1 Procedure OT Treatments $Self Care/Home Management : 23-37 mins G-Codes:    Lazaro Isenhower 04-30-16, 9:15 AM

## 2016-04-08 NOTE — Evaluation (Signed)
Physical Therapy Evaluation Patient Details Name: Scott Parks MRN: [id number] DOB: January 22, 1941 Today's Date: 04/08/2016   History of Present Illness  Lumbar laminectomy 2-3, 3-4, 4-5,  lumbar fusion 4-5 PLIF without interband, R hemilaminectomy L2-3, 3-4. Pt. PMH: OA,   Clinical Impression  Pt is at or close to baseline functioning and should be safe at home with wife's assist.  All education completed.. There are no further acute PT needs.  Will sign off at this time.     Follow Up Recommendations No PT follow up    Equipment Recommendations  None recommended by PT    Recommendations for Other Services       Precautions / Restrictions Precautions Precautions: Back;Fall Precaution Comments: Pt. ed on back precautions for ADLs, bed mobility, and transfers.  Required Braces or Orthoses: Spinal Brace Restrictions Weight Bearing Restrictions: No Other Position/Activity Restrictions: Don brace in sitting.       Mobility  Bed Mobility Overal bed mobility: Modified Independent                Transfers Overall transfer level: Modified independent   Transfers: Sit to/from Stand Sit to Stand: Modified independent (Device/Increase time)         General transfer comment: used safe transfer technque  Ambulation/Gait Ambulation/Gait assistance: Supervision Ambulation Distance (Feet): 250 Feet Assistive device: None Gait Pattern/deviations: Step-through pattern   Gait velocity interpretation: at or above normal speed for age/gender General Gait Details: steady without RW.  Not yet able to speed up appreciably.  Stairs Stairs: Yes Stairs assistance: Supervision   Number of Stairs: 4 General stair comments: safe with rail  Wheelchair Mobility    Modified Rankin (Stroke Patients Only)       Balance Overall balance assessment: No apparent balance deficits (not formally assessed)                                           Pertinent  Vitals/Pain Pain Assessment: 0-10 Pain Score: 4  Pain Location: back Pain Descriptors / Indicators: Discomfort Pain Intervention(s): Monitored during session    Home Living Family/patient expects to be discharged to:: Private residence Living Arrangements: Spouse/significant other Available Help at Discharge: Family;Available 24 hours/day Type of Home: Apartment Home Access: Stairs to enter Entrance Stairs-Rails: Right;Left;Can reach both Entrance Stairs-Number of Steps: 3 Home Layout: Able to live on main level with bedroom/bathroom Home Equipment: Walker - 2 wheels;Shower seat;Toilet riser      Prior Function Level of Independence: Independent               Hand Dominance   Dominant Hand: Right    Extremity/Trunk Assessment   Upper Extremity Assessment: Overall WFL for tasks assessed           Lower Extremity Assessment: Overall WFL for tasks assessed         Communication   Communication: No difficulties  Cognition Arousal/Alertness: Awake/alert Behavior During Therapy: WFL for tasks assessed/performed Overall Cognitive Status: Within Functional Limits for tasks assessed                      General Comments General comments (skin integrity, edema, etc.): pt/wife instructed in back care/pre, logroll, lifting restrictions, bracing issues, progression of activity.    Exercises        Assessment/Plan    PT Assessment Patent does not need any further PT services  PT Diagnosis Acute pain   PT Problem List    PT Treatment Interventions     PT Goals (Current goals can be found in the Care Plan section) Acute Rehab PT Goals PT Goal Formulation: All assessment and education complete, DC therapy    Frequency     Barriers to discharge        Co-evaluation               End of Session Equipment Utilized During Treatment: Back brace Activity Tolerance: Patient tolerated treatment well Patient left: in bed;Other (comment) (sitting  EOB)           Time: [id number] PT Time Calculation (min) (ACUTE ONLY): 18 min   Charges:   PT Evaluation $PT Eval Low Complexity: 1 Procedure     PT G Codes:        Tonatiuh Mallon, Tessie Fass 04/08/2016, 10:49 AM 04/08/2016  Donnella Sham, PT [phone]  (pager)

## 2016-05-03 LAB — HM DIABETES EYE EXAM

## 2016-05-16 DIAGNOSIS — M4316 Spondylolisthesis, lumbar region: Secondary | ICD-10-CM | POA: Diagnosis not present

## 2016-05-16 DIAGNOSIS — Z6826 Body mass index (BMI) 26.0-26.9, adult: Secondary | ICD-10-CM | POA: Diagnosis not present

## 2016-07-06 ENCOUNTER — Encounter: Payer: Self-pay | Admitting: Family Medicine

## 2016-07-06 ENCOUNTER — Ambulatory Visit (INDEPENDENT_AMBULATORY_CARE_PROVIDER_SITE_OTHER): Payer: Medicare Other | Admitting: Family Medicine

## 2016-07-06 VITALS — BP 124/72 | HR 70 | Ht 69.0 in | Wt 182.0 lb

## 2016-07-06 DIAGNOSIS — E1169 Type 2 diabetes mellitus with other specified complication: Secondary | ICD-10-CM

## 2016-07-06 DIAGNOSIS — Z981 Arthrodesis status: Secondary | ICD-10-CM | POA: Diagnosis not present

## 2016-07-06 DIAGNOSIS — E118 Type 2 diabetes mellitus with unspecified complications: Secondary | ICD-10-CM | POA: Diagnosis not present

## 2016-07-06 DIAGNOSIS — I10 Essential (primary) hypertension: Secondary | ICD-10-CM

## 2016-07-06 DIAGNOSIS — E1159 Type 2 diabetes mellitus with other circulatory complications: Secondary | ICD-10-CM

## 2016-07-06 DIAGNOSIS — E785 Hyperlipidemia, unspecified: Secondary | ICD-10-CM | POA: Diagnosis not present

## 2016-07-06 DIAGNOSIS — R972 Elevated prostate specific antigen [PSA]: Secondary | ICD-10-CM

## 2016-07-06 LAB — POCT GLYCOSYLATED HEMOGLOBIN (HGB A1C): HEMOGLOBIN A1C: 8.1

## 2016-07-06 NOTE — Progress Notes (Signed)
  Subjective:    Patient ID: Scott Parks, male    DOB: July 26, 1941, 75 y.o.   MRN: [id number]  Scott Parks is a 75 y.o. male who presents for follow-up of Type 2 diabetes mellitus.  Patient is checking home blood sugars.   Home blood sugar records: 106 to 185 How often is blood sugars being checked:BID   Daily foot checks: yes  Any foot concerns: none Last eye exam: 05/03/16 Exercise: walking. He recently started walking again. He did have recent back surgery with lumbar fusion which has interfered with his ability to walk. He also continues to be followed by Dr. Dorina Hoyer for his elevated PSA. He continues on metformin as well as Januvia and is having no difficulty with them. He is also taking Accupril for his blood pressure.  The following portions of the patient's history were reviewed and updated as appropriate: allergies, current medications, past medical history, past social history and problem list.  ROS as in subjective above.     Objective:    Physical Exam Alert and in no distress otherwise not examined.  Lab Review Diabetic Labs Latest Ref Rng & Units 03/30/2016 02/29/2016 10/29/2015 07/01/2015 12/29/2014  HbA1c - - 8.5 8.1 7.4 -  Microalbumin mg/L - 10.0 - - -  Micro/Creat Ratio - - 4.7 - - -  Chol 125 - 200 mg/dL - 172 - - 178  HDL >=40 mg/dL - 78 - - 59  Calc LDL <130 mg/dL - 82 - - 102(H)  Triglycerides <150 mg/dL - 62 - - 84  Creatinine 0.61 - 1.24 mg/dL 0.98 1.08 - - 0.94   BP/Weight 04/08/2016 04/07/2016 03/30/2016 02/29/2016 [id number]  Systolic BP [id number] - 0000000 0000000 [id number]  Diastolic BP 67 - 99 88 84  Wt. (Lbs) - 178 178.2 174.6 -  BMI - 26.29 26.32 25.77 -   Foot/eye exam completion dates Latest Ref Rng & Units 05/03/2016 02/29/2016  Eye Exam No Retinopathy No Retinopathy -  Foot Form Completion - - Done   A1c is 8.1 Scott Parks  reports that he quit smoking about 31 years ago. He has never used smokeless tobacco. He reports that he does not drink alcohol or use drugs.    Assessment & Plan:    Diabetes mellitus with complication ([place]) - Plan: HgB A1c  Elevated PSA  Hyperlipidemia associated with type 2 diabetes mellitus ([place])  Hypertension associated with diabetes ([place])  S/P lumbar spinal fusion  Type 2 diabetes mellitus with complication, without long-term current use of insulin ([place])   1. Rx changes: none 2. Education: Reviewed 'ABCs' of diabetes management (respective goals in parentheses):  A1C (<7), blood pressure (<130/80), and cholesterol (LDL <100). 3. Compliance at present is estimated to be fair. Efforts to improve compliance (if necessary) will be directed at increased exercise. 4. Follow up: 4 months His A1c is not ideal but he has started walking again. The spinal lumbar fusion did slow him down and interfere with his normal activities. I think that he will probably be able to get his A1c lower with just increased physical activity.

## 2016-08-23 DIAGNOSIS — M4316 Spondylolisthesis, lumbar region: Secondary | ICD-10-CM | POA: Diagnosis not present

## 2016-09-27 DIAGNOSIS — R972 Elevated prostate specific antigen [PSA]: Secondary | ICD-10-CM | POA: Diagnosis not present

## 2016-10-05 DIAGNOSIS — N400 Enlarged prostate without lower urinary tract symptoms: Secondary | ICD-10-CM | POA: Diagnosis not present

## 2016-10-05 DIAGNOSIS — R972 Elevated prostate specific antigen [PSA]: Secondary | ICD-10-CM | POA: Diagnosis not present

## 2016-10-05 DIAGNOSIS — N5201 Erectile dysfunction due to arterial insufficiency: Secondary | ICD-10-CM | POA: Diagnosis not present

## 2016-11-02 ENCOUNTER — Encounter: Payer: Self-pay | Admitting: Family Medicine

## 2016-11-02 ENCOUNTER — Ambulatory Visit (INDEPENDENT_AMBULATORY_CARE_PROVIDER_SITE_OTHER): Payer: Medicare Other | Admitting: Family Medicine

## 2016-11-02 VITALS — BP 120/78 | HR 68 | Ht 69.0 in | Wt 181.0 lb

## 2016-11-02 DIAGNOSIS — E118 Type 2 diabetes mellitus with unspecified complications: Secondary | ICD-10-CM

## 2016-11-02 DIAGNOSIS — E1169 Type 2 diabetes mellitus with other specified complication: Secondary | ICD-10-CM

## 2016-11-02 DIAGNOSIS — I10 Essential (primary) hypertension: Secondary | ICD-10-CM

## 2016-11-02 DIAGNOSIS — E785 Hyperlipidemia, unspecified: Secondary | ICD-10-CM

## 2016-11-02 DIAGNOSIS — E1159 Type 2 diabetes mellitus with other circulatory complications: Secondary | ICD-10-CM | POA: Diagnosis not present

## 2016-11-02 DIAGNOSIS — I152 Hypertension secondary to endocrine disorders: Secondary | ICD-10-CM

## 2016-11-02 DIAGNOSIS — Z981 Arthrodesis status: Secondary | ICD-10-CM

## 2016-11-02 LAB — POCT GLYCOSYLATED HEMOGLOBIN (HGB A1C): Hemoglobin A1C: 7.2

## 2016-11-02 NOTE — Progress Notes (Signed)
  Subjective:    Patient ID: Scott Parks, male    DOB: September 12, 1940, 76 y.o.   MRN: [id number]  Scott Parks is a 76 y.o. male who presents for follow-up of Type 2 diabetes mellitus.  Home blood sugar records: 124-191 Current symptoms/problems include none and have been unchanged. Daily foot checks:   Any foot concerns: None Exercise: He is now started back into an exercise program after having recent back surgery. He is walking, running a little bit as well as doing some weights 4 times per week. Diet: Regular  He continues on atorvastatin and is having no trouble with that. He also is taking metformin and again with no complaints. On Accupril for his blood pressure. He is taking a multivitamin as well as calcium and vitamin D supplementation. The following portions of the patient's history were reviewed and updated as appropriate: allergies, current medications, past medical history, past social history and problem list.  ROS as in subjective above.     Objective:    Physical Exam Alert and in no distress otherwise not examined.  Blood pressure 120/78, pulse 68, height 5\' 9"  (1.753 m), weight 181 lb (82.1 kg), SpO2 98 %.  Lab Review Diabetic Labs Latest Ref Rng & Units 07/06/2016 03/30/2016 02/29/2016 10/29/2015 07/01/2015  HbA1c - 8.1 - 8.5 8.1 7.4  Microalbumin mg/L - - 10.0 - -  Micro/Creat Ratio - - - 4.7 - -  Chol 125 - 200 mg/dL - - 172 - -  HDL >=40 mg/dL - - 78 - -  Calc LDL <130 mg/dL - - 82 - -  Triglycerides <150 mg/dL - - 62 - -  Creatinine 0.61 - 1.24 mg/dL - 0.98 1.08 - -   BP/Weight 11/02/2016 07/06/2016 04/08/2016 04/07/2016 5/36/4680  Systolic BP 321 224 825 - 003  Diastolic BP 78 72 67 - 99  Wt. (Lbs) 181 182 - 178 178.2  BMI 26.73 26.88 - 26.29 26.32   Foot/eye exam completion dates Latest Ref Rng & Units 05/03/2016 02/29/2016  Eye Exam No Retinopathy No Retinopathy -  Foot Form Completion - - Done   A1c 7.2 Scott Parks  reports that he quit smoking about 32 years  ago. He has never used smokeless tobacco. He reports that he does not drink alcohol or use drugs.     Assessment & Plan:    Diabetes mellitus with complication ([place])  Hyperlipidemia associated with type 2 diabetes mellitus ([place])  Hypertension associated with diabetes ([place])  S/P lumbar spinal fusion He is doing well on his present medications and no need to make any major changes. Continue present meds    1. Rx changes: none 2. Education: Reviewed 'ABCs' of diabetes management (respective goals in parentheses):  A1C (<7), blood pressure (<130/80), and cholesterol (LDL <100). 3. Compliance at present is estimated to be good. Efforts to improve compliance (if necessary) will be directed at Continue with present exercise regimen. 4. Follow up: 4 months He is doing much better now that he can start exercising again. His A1c has definitely shown an improvement.

## 2016-11-13 ENCOUNTER — Other Ambulatory Visit: Payer: Self-pay | Admitting: Family Medicine

## 2016-11-13 DIAGNOSIS — E1169 Type 2 diabetes mellitus with other specified complication: Secondary | ICD-10-CM

## 2016-11-13 DIAGNOSIS — E785 Hyperlipidemia, unspecified: Principal | ICD-10-CM

## 2017-03-08 ENCOUNTER — Encounter: Payer: Self-pay | Admitting: Family Medicine

## 2017-03-08 ENCOUNTER — Ambulatory Visit (INDEPENDENT_AMBULATORY_CARE_PROVIDER_SITE_OTHER): Payer: Medicare Other | Admitting: Family Medicine

## 2017-03-08 VITALS — BP 124/70 | HR 72 | Ht 69.0 in | Wt 176.0 lb

## 2017-03-08 DIAGNOSIS — R972 Elevated prostate specific antigen [PSA]: Secondary | ICD-10-CM

## 2017-03-08 DIAGNOSIS — I10 Essential (primary) hypertension: Secondary | ICD-10-CM

## 2017-03-08 DIAGNOSIS — Z136 Encounter for screening for cardiovascular disorders: Secondary | ICD-10-CM

## 2017-03-08 DIAGNOSIS — E1159 Type 2 diabetes mellitus with other circulatory complications: Secondary | ICD-10-CM

## 2017-03-08 DIAGNOSIS — Z1211 Encounter for screening for malignant neoplasm of colon: Secondary | ICD-10-CM

## 2017-03-08 DIAGNOSIS — E118 Type 2 diabetes mellitus with unspecified complications: Secondary | ICD-10-CM

## 2017-03-08 DIAGNOSIS — E1169 Type 2 diabetes mellitus with other specified complication: Secondary | ICD-10-CM | POA: Diagnosis not present

## 2017-03-08 DIAGNOSIS — N529 Male erectile dysfunction, unspecified: Secondary | ICD-10-CM | POA: Diagnosis not present

## 2017-03-08 DIAGNOSIS — Z981 Arthrodesis status: Secondary | ICD-10-CM | POA: Diagnosis not present

## 2017-03-08 DIAGNOSIS — E785 Hyperlipidemia, unspecified: Secondary | ICD-10-CM

## 2017-03-08 LAB — CBC WITH DIFFERENTIAL/PLATELET
BASOS ABS: 0 {cells}/uL (ref 0–200)
Basophils Relative: 0 %
EOS PCT: 4 %
Eosinophils Absolute: 240 cells/uL (ref 15–500)
HCT: 40.7 % (ref 38.5–50.0)
HEMOGLOBIN: 13.2 g/dL (ref 13.2–17.1)
LYMPHS ABS: 1620 {cells}/uL (ref 850–3900)
LYMPHS PCT: 27 %
MCH: 26.5 pg — AB (ref 27.0–33.0)
MCHC: 32.4 g/dL (ref 32.0–36.0)
MCV: 81.6 fL (ref 80.0–100.0)
MONOS PCT: 7 %
MPV: 10.9 fL (ref 7.5–12.5)
Monocytes Absolute: 420 cells/uL (ref 200–950)
NEUTROS PCT: 62 %
Neutro Abs: 3720 cells/uL (ref 1500–7800)
Platelets: 205 10*3/uL (ref 140–400)
RBC: 4.99 MIL/uL (ref 4.20–5.80)
RDW: 14.3 % (ref 11.0–15.0)
WBC: 6 10*3/uL (ref 4.0–10.5)

## 2017-03-08 LAB — POCT UA - MICROALBUMIN
ALBUMIN/CREATININE RATIO, URINE, POC: 7.3
CREATININE, POC: 205.7 mg/dL
MICROALBUMIN (UR) POC: 15.1 mg/L

## 2017-03-08 LAB — POCT GLYCOSYLATED HEMOGLOBIN (HGB A1C): HEMOGLOBIN A1C: 7.5

## 2017-03-08 MED ORDER — ATORVASTATIN CALCIUM 40 MG PO TABS: 40.0000 mg | ORAL_TABLET | Freq: Every day | ORAL | 3 refills | Status: DC

## 2017-03-08 MED ORDER — QUINAPRIL HCL 40 MG PO TABS: 40.0000 mg | ORAL_TABLET | Freq: Every day | ORAL | 3 refills | Status: DC

## 2017-03-08 NOTE — Progress Notes (Signed)
Subjective:   HPI  Scott Parks is a 76 y.o. male who presents for Chief Complaint  Patient presents with  . Medicare Wellness    med check plus    Medical care team includes: Denita Lung, MD here for primary care  Dr.Dahlsted    Preventative care: [employer] Last ophthalmology visit: 8/17 Last dental visit: 4/18 Last colonoscopy:07/30/07 Last prostate exam: 2/18 Last EKG:03/30/16 Last labs:02/29/16  Prior vaccinations:  TD or Tdap: 06/21/16 Influenza:07/13/16 Pneumococcal: 23: 07/20/07, 13:12/29/14 Shingles/Zostavax:12/28/09  Other: Shingrix 11/2016  Advanced directive: Yes. Asked for a copy He has underlying diabetes and does check his blood sugars regularly. They are usually below 180. He is exercising but less so because of recent back surgery. He does check his feet regularly. He has an eye exam scheduled in the near future. Smoking and drinking were reviewed. He quit smoking several decades ago. Continues on atorvastatin and is having no aches or pains with that. Takes metformin twice per day without difficulty. He is also on Accupril. He does have an elevated PSA and does follow up with urology on a yearly basis. He does take a multivitamin with extra vitamin D. He is enjoying his retirement.  Reviewed their medical, surgical, family, social, medication, and allergy history and updated chart as appropriate.  Past Medical History:  Diagnosis Date  . Arthritis   . Diabetes mellitus without complication ([place])    Type II  . Hyperlipidemia   . Hypertension     Past Surgical History:  Procedure Laterality Date  . COLONOSCOPY    . HAND SURGERY Right   . KNEE ARTHROSCOPY Left   . LUMBAR LAMINECTOMY/DECOMPRESSION MICRODISCECTOMY Right 04/07/2016   Procedure: hemilaminectomy  - Lumbar tow-three - Lumbar three-four - right;  Surgeon: Eustace Moore, MD;  Location: [facility];  Service: Neurosurgery;  Laterality: Right;  . MAXIMUM ACCESS (MAS)POSTERIOR LUMBAR INTERBODY  FUSION (PLIF) 1 LEVEL N/A 04/07/2016   Procedure: lumbar two-three, three-four, four-five laminectomy lumbar four-five fusion with pedicle screws (PLIF without interbodies);  Surgeon: Eustace Moore, MD;  Location: [facility];  Service: Neurosurgery;  Laterality: N/A;  . TRIGGER FINGER RELEASE Left    ring finger    Social History   Social History  . Marital status: Married    Spouse name: N/A  . Number of children: N/A  . Years of education: N/A   Occupational History  . Not on file.   Social History Main Topics  . Smoking status: Former Smoker    Quit date: 08/15/1984  . Smokeless tobacco: Never Used  . Alcohol use No  . Drug use: No  . Sexual activity: Yes   Other Topics Concern  . Not on file   Social History Narrative  . No narrative on file    No family history on file.   Current Outpatient Prescriptions:  .  atorvastatin (LIPITOR) 40 MG tablet, Take 1 tablet (40 mg total) by mouth daily., Disp: 90 tablet, Rfl: 3 .  Calcium Carbonate-Vitamin D (CALCIUM + D PO), Take 1 tablet by mouth daily. , Disp: , Rfl:  .  glucose blood test strip, Needs Test strips and lancets for a ONE TOUCH VERIO IQ MACHINE, Disp: 100 each, Rfl: 5 .  metFORMIN (GLUCOPHAGE) 1000 MG tablet, TAKE 1 TABLET TWICE A DAY WITH MEALS, Disp: 180 tablet, Rfl: 1 .  Omega-3 Fatty Acids (FISH OIL) 1000 MG CAPS, Take 1 capsule by mouth daily. , Disp: , Rfl:  .  quinapril (ACCUPRIL)  40 MG tablet, Take 1 tablet (40 mg total) by mouth daily., Disp: 90 tablet, Rfl: 3 .  thiamine (VITAMIN B-1) 100 MG tablet, Take 100 mg by mouth daily., Disp: , Rfl:   Allergies  Allergen Reactions  . No Known Allergies        Review of Systems Negative except as above    Objective:    General appearance: alert, no distress, WD/WN, African American male Skin: Normal HEENT: normocephalic, conjunctiva/corneas normal, sclerae anicteric, PERRLA, EOMi, nares patent, no discharge or erythema, pharynx normal Oral cavity:  MMM, tongue normal, teeth normal Neck: supple, no lymphadenopathy, no thyromegaly, no masses, normal ROM, no bruits Chest: non tender, normal shape and expansion Heart: RRR, normal S1, S2, no murmurs Lungs: CTA bilaterally, no wheezes, rhonchi, or rales Abdomen: +bs, soft, non tender, non distended, no masses, no hepatomegaly, no splenomegaly, no bruits Back: non tender, normal ROM, no scoliosis Musculoskeletal: upper extremities non tender, no obvious deformity, normal ROM throughout, lower extremities non tender, no obvious deformity, normal ROM throughout Extremities: no edema, no cyanosis, no clubbing Pulses: 2+ symmetric, upper and lower extremities, normal cap refill Neurological: alert, oriented x 3, CN2-12 intact, strength normal upper extremities and lower extremities, sensation normal throughout, DTRs 2+ throughout, no cerebellar signs, gait normal Psychiatric: normal affect, behavior normal, pleasant  A1c is 7.5  Assessment and Plan :    Type 2 diabetes mellitus with complication, without long-term current use of insulin ([facility]) - Plan: POCT UA - Microalbumin, HgB A1c, CBC with Differential/Platelet, Comprehensive metabolic panel, Lipid panel  Screening for colon cancer - Plan: Cologuard  Elevated PSA  Hypertension associated with diabetes ([place]) - Plan: CBC with Differential/Platelet, Comprehensive metabolic panel  S/P lumbar spinal fusion  Hyperlipidemia associated with type 2 diabetes mellitus ([place]) - Plan: atorvastatin (LIPITOR) 40 MG tablet  Erectile dysfunction, unspecified erectile dysfunction type  Screening for AAA (abdominal aortic aneurysm) - Plan: US Aorta Initial Medicare Screen  Essential hypertension - Plan: quinapril (ACCUPRIL) 40 MG tablet He plans to see his urologist one more time to check his elevated PSA. Discussed his ED and he does state the Cialis works the best but does not want any at the present time. Cologuard ordered in lieu of colonoscopy.  Encouraged him to continue with his diet and exercise regimen. He will continue on his present medications as he is doing quite well on them.  Physical exam - discussed and counseled on healthy lifestyle, diet, exercise, preventative care, vaccinations, s

## 2017-03-08 NOTE — Patient Instructions (Signed)
  Mr. Scott Parks , Thank you for taking time to come for your Medicare Wellness Visit. I appreciate your ongoing commitment to your health goals. Please review the following plan we discussed and let me know if I can assist you in the future.   These are the goals we discussed: Goals    None      This is a list of the screening recommended for you and due dates:  Health Maintenance  Topic Date Due  . Pneumonia vaccines (2 of 2 - PPSV23) 12/29/2015  . Complete foot exam   02/28/2017  . Flu Shot  03/15/2017  . Eye exam for diabetics  05/03/2017  . Hemoglobin A1C  05/05/2017  . Colon Cancer Screening  07/29/2017  . Tetanus Vaccine  06/21/2026

## 2017-03-09 LAB — COMPREHENSIVE METABOLIC PANEL
ALBUMIN: 4.3 g/dL (ref 3.6–5.1)
ALK PHOS: 100 U/L (ref 40–115)
ALT: 13 U/L (ref 9–46)
AST: 14 U/L (ref 10–35)
BILIRUBIN TOTAL: 0.6 mg/dL (ref 0.2–1.2)
BUN: 16 mg/dL (ref 7–25)
CO2: 24 mmol/L (ref 20–31)
Calcium: 9.6 mg/dL (ref 8.6–10.3)
Chloride: 103 mmol/L (ref 98–110)
Creat: 1.08 mg/dL (ref 0.70–1.18)
Glucose, Bld: 136 mg/dL — ABNORMAL HIGH (ref 65–99)
POTASSIUM: 4.4 mmol/L (ref 3.5–5.3)
SODIUM: 138 mmol/L (ref 135–146)
Total Protein: 6.6 g/dL (ref 6.1–8.1)

## 2017-03-09 LAB — LIPID PANEL
CHOL/HDL RATIO: 2 ratio (ref <5.0)
CHOLESTEROL: 158 mg/dL (ref <200)
HDL: 81 mg/dL (ref >40)
LDL Cholesterol: 66 mg/dL (ref <100)
Triglycerides: 57 mg/dL (ref <150)
VLDL: 11 mg/dL (ref <30)

## 2017-04-07 ENCOUNTER — Ambulatory Visit
Admission: RE | Admit: 2017-04-07 | Discharge: 2017-04-07 | Disposition: A | Discharge status: Home / Self Care | Payer: Medicare Other | Source: Ambulatory Visit | Attending: Family Medicine | Admitting: Family Medicine

## 2017-04-07 DIAGNOSIS — Z136 Encounter for screening for cardiovascular disorders: Secondary | ICD-10-CM | POA: Diagnosis not present

## 2017-04-07 DIAGNOSIS — Z87891 Personal history of nicotine dependence: Secondary | ICD-10-CM | POA: Diagnosis not present

## 2017-05-13 ENCOUNTER — Other Ambulatory Visit: Payer: Self-pay | Admitting: Family Medicine

## 2017-08-12 ENCOUNTER — Other Ambulatory Visit: Payer: Self-pay | Admitting: Family Medicine

## 2017-08-14 ENCOUNTER — Encounter: Payer: Self-pay | Admitting: Family Medicine

## 2017-08-14 NOTE — Telephone Encounter (Signed)
Pt aware need for appt- he states he does not need any refills at this time.  Will deny rx. RLB

## 2017-08-16 ENCOUNTER — Other Ambulatory Visit: Payer: Self-pay

## 2017-08-22 ENCOUNTER — Other Ambulatory Visit: Payer: Self-pay | Admitting: Family Medicine

## 2017-08-22 MED ORDER — ONETOUCH DELICA LANCETS 33G MISC: 1.0000 | Freq: Once | 2 refills | Status: AC

## 2017-08-22 MED ORDER — GLUCOSE BLOOD VI STRP: ORAL_STRIP | 12 refills | Status: AC

## 2017-08-24 ENCOUNTER — Encounter: Payer: Self-pay | Admitting: Family Medicine

## 2017-08-30 ENCOUNTER — Encounter: Payer: Self-pay | Admitting: Internal Medicine

## 2017-08-31 ENCOUNTER — Encounter: Payer: Self-pay | Admitting: Family Medicine

## 2017-08-31 ENCOUNTER — Ambulatory Visit (INDEPENDENT_AMBULATORY_CARE_PROVIDER_SITE_OTHER): Payer: Medicare Other | Admitting: Family Medicine

## 2017-08-31 VITALS — BP 132/84 | HR 73 | Wt 178.6 lb

## 2017-08-31 DIAGNOSIS — E785 Hyperlipidemia, unspecified: Secondary | ICD-10-CM

## 2017-08-31 DIAGNOSIS — Z981 Arthrodesis status: Secondary | ICD-10-CM

## 2017-08-31 DIAGNOSIS — E1159 Type 2 diabetes mellitus with other circulatory complications: Secondary | ICD-10-CM | POA: Diagnosis not present

## 2017-08-31 DIAGNOSIS — E1169 Type 2 diabetes mellitus with other specified complication: Secondary | ICD-10-CM | POA: Diagnosis not present

## 2017-08-31 DIAGNOSIS — N529 Male erectile dysfunction, unspecified: Secondary | ICD-10-CM | POA: Diagnosis not present

## 2017-08-31 DIAGNOSIS — I152 Hypertension secondary to endocrine disorders: Secondary | ICD-10-CM

## 2017-08-31 DIAGNOSIS — I10 Essential (primary) hypertension: Secondary | ICD-10-CM | POA: Diagnosis not present

## 2017-08-31 LAB — POCT GLYCOSYLATED HEMOGLOBIN (HGB A1C): HEMOGLOBIN A1C: 7.8

## 2017-08-31 LAB — POCT UA - MICROALBUMIN
CREATININE, POC: 40.6 mg/dL
MICROALBUMIN (UR) POC: 12.3 mg/L

## 2017-08-31 NOTE — Progress Notes (Signed)
  Subjective:    Patient ID: Scott Parks, male    DOB: Feb 02, 1941, 77 y.o.   MRN: [id number]  Scott Parks is a 77 y.o. male who presents for follow-up of Type 2 diabetes mellitus.  Patient is checking home blood sugars.   Home blood sugar records:114-187 avg. How often is blood sugars being checked: twice a day Current symptoms/problems include none and have been unchanged. Daily foot checks: everyday   Any foot concerns: none Last eye exam: 3 months ago Exercise: yes four time a week for an hour He continues on metformin and having no difficulty with that.  He is also taking Lipitor as well as Accupril again having no complaints.  He is on multiple OTC medications.  He does have underlying ADD and has tried various medications but at this point is not interested in pursuing other options.  He also states that although he has had back pain at the present time he is very active and the back is causing him really no major difficulty. The following portions of the patient's history were reviewed and updated as appropriate: allergies, current medications, past medical history, past social history and problem list.  ROS as in subjective above.     Objective:    Physical Exam Alert and in no distress otherwise not examined.   Lab Review Diabetic Labs Latest Ref Rng & Units 03/08/2017 11/02/2016 07/06/2016 03/30/2016 02/29/2016  HbA1c - 7.5 7.2 8.1 - 8.5  Microalbumin mg/L 15.1 - - - 10.0  Micro/Creat Ratio - 7.3 - - - 4.7  Chol <200 mg/dL 158 - - - 172  HDL >40 mg/dL 81 - - - 78  Calc LDL <100 mg/dL 66 - - - 82  Triglycerides <150 mg/dL 57 - - - 62  Creatinine 0.70 - 1.18 mg/dL 1.08 - - 0.98 1.08   BP/Weight 03/08/2017 11/02/2016 07/06/2016 04/08/2016 3/61/4431  Systolic BP 540 086 761 950 -  Diastolic BP 70 78 72 67 -  Wt. (Lbs) 176 181 182 - 178  BMI 25.99 26.73 26.88 - 26.29   Foot/eye exam completion dates Latest Ref Rng & Units 05/03/2016 02/29/2016  Eye Exam No Retinopathy No  Retinopathy -  Foot Form Completion - - Done  A1c is 7.8 Scott Parks  reports that he quit smoking about 33 years ago. he has never used smokeless tobacco. He reports that he does not drink alcohol or use drugs.     Assessment & Plan:    Type 2 diabetes mellitus with other specified complication, without long-term current use of insulin (HCC) - Plan: HgB A1c, POCT UA - Microalbumin  Hypertension associated with diabetes ([place])  Hyperlipidemia associated with type 2 diabetes mellitus ([place])  S/P lumbar spinal fusion  Erectile dysfunction, unspecified erectile dysfunction type 1.  2. Rx changes: none 3. Education: Reviewed 'ABCs' of diabetes management (respective goals in parentheses):  A1C (<7), blood pressure (<130/80), and cholesterol (LDL <100). 4. Compliance at present is estimated to be good. Efforts to improve compliance (if necessary) will be directed at increased exercise. 5. Follow up: 4 months He just went through the holidays and although the numbers are getting slightly worse, strongly encouraged him to make further diet and exercise changes which he plans to do.

## 2017-09-06 ENCOUNTER — Encounter: Payer: Self-pay | Admitting: Internal Medicine

## 2017-10-09 DIAGNOSIS — R972 Elevated prostate specific antigen [PSA]: Secondary | ICD-10-CM | POA: Diagnosis not present

## 2017-11-02 ENCOUNTER — Encounter: Payer: Medicare Other | Admitting: Internal Medicine

## 2017-11-03 ENCOUNTER — Other Ambulatory Visit: Payer: Self-pay

## 2017-11-03 ENCOUNTER — Ambulatory Visit (AMBULATORY_SURGERY_CENTER): Payer: Self-pay | Admitting: *Deleted

## 2017-11-03 VITALS — Ht 69.0 in | Wt 175.6 lb

## 2017-11-03 DIAGNOSIS — Z1211 Encounter for screening for malignant neoplasm of colon: Secondary | ICD-10-CM

## 2017-11-03 NOTE — Progress Notes (Signed)
No egg or soy allergy known to patient  No issues with past sedation with any surgeries  or procedures, no intubation problems  No diet pills per patient No home 02 use per patient  No blood thinners per patient  Pt denies issues with constipation  No A fib or A flutter  EMMI video sent to pt's e mail pt. Declined  

## 2017-11-22 ENCOUNTER — Encounter: Payer: Self-pay | Admitting: Internal Medicine

## 2017-11-22 ENCOUNTER — Ambulatory Visit (AMBULATORY_SURGERY_CENTER): Payer: Medicare Other | Admitting: Internal Medicine

## 2017-11-22 ENCOUNTER — Other Ambulatory Visit: Payer: Self-pay

## 2017-11-22 VITALS — BP 114/68 | HR 61 | Temp 99.3°F | Resp 33 | Ht 69.0 in | Wt 175.0 lb

## 2017-11-22 DIAGNOSIS — E119 Type 2 diabetes mellitus without complications: Secondary | ICD-10-CM | POA: Diagnosis not present

## 2017-11-22 DIAGNOSIS — Z1211 Encounter for screening for malignant neoplasm of colon: Secondary | ICD-10-CM

## 2017-11-22 DIAGNOSIS — Z1212 Encounter for screening for malignant neoplasm of rectum: Secondary | ICD-10-CM

## 2017-11-22 DIAGNOSIS — Z86010 Personal history of colonic polyps: Secondary | ICD-10-CM | POA: Diagnosis not present

## 2017-11-22 DIAGNOSIS — I10 Essential (primary) hypertension: Secondary | ICD-10-CM | POA: Diagnosis not present

## 2017-11-22 DIAGNOSIS — D124 Benign neoplasm of descending colon: Secondary | ICD-10-CM | POA: Diagnosis not present

## 2017-11-22 MED ORDER — SODIUM CHLORIDE 0.9 % IV SOLN
500.0000 mL | Freq: Once | INTRAVENOUS | Status: DC
Start: 2017-11-22 — End: 2022-09-21

## 2017-11-22 MED ORDER — SODIUM CHLORIDE 0.9 % IV SOLN: 500.0000 mL | Freq: Once | INTRAVENOUS | Status: DC

## 2017-11-22 NOTE — Progress Notes (Signed)
Called to room to assist during endoscopic procedure.  Patient ID and intended procedure confirmed with present staff. Received instructions for my participation in the procedure from the performing physician.  

## 2017-11-22 NOTE — Op Note (Signed)
[facility] Patient Name: Scott Parks Procedure Date: 11/22/2017 2:01 PM MRN: [id number] Endoscopist: Gatha Mayer , MD Age: 77 Referring MD:  Date of Birth: September 12, 1940 Gender: Male Account #: [id number] Procedure:                Colonoscopy Indications:              Screening for colorectal malignant neoplasm, Last                            colonoscopy: 2008 Medicines:                Propofol per Anesthesia, Monitored Anesthesia Care Procedure:                Pre-Anesthesia Assessment:                           - Prior to the procedure, a History and Physical                            was performed, and patient medications and                            allergies were reviewed. The patient's tolerance of                            previous anesthesia was also reviewed. The risks                            and benefits of the procedure and the sedation                            options and risks were discussed with the patient.                            All questions were answered, and informed consent                            was obtained. Prior Anticoagulants: The patient has                            taken no previous anticoagulant or antiplatelet                            agents. ASA Grade Assessment: III - A patient with                            severe systemic disease. After reviewing the risks                            and benefits, the patient was deemed in                            satisfactory condition to undergo the procedure.  After obtaining informed consent, the colonoscope                            was passed under direct vision. Throughout the                            procedure, the patient's blood pressure, pulse, and                            oxygen saturations were monitored continuously. The                            Colonoscope was introduced through the anus and                            advanced to the  the cecum, identified by                            appendiceal orifice and ileocecal valve. The                            colonoscopy was performed without difficulty. The                            patient tolerated the procedure well. The quality                            of the bowel preparation was good. The bowel                            preparation used was Miralax. The ileocecal valve,                            appendiceal orifice, and rectum were photographed. Scope In: 2:12:25 PM Scope Out: 2:23:16 PM Scope Withdrawal Time: 0 hours 6 minutes 17 seconds  Total Procedure Duration: 0 hours 10 minutes 51 seconds  Findings:                 The perianal examination was normal.                           The digital rectal exam findings include enlarged                            prostate. Pertinent negatives include no palpable                            rectal lesions.                           A diminutive polyp was found in the descending                            colon. The polyp was sessile. The polyp was removed  with a cold snare. Resection and retrieval were                            complete. Verification of patient identification                            for the specimen was done. Estimated blood loss was                            minimal.                           The exam was otherwise without abnormality on                            direct and retroflexion views. Complications:            No immediate complications. Estimated Blood Loss:     Estimated blood loss was minimal. Impression:               - Enlarged prostate found on digital rectal exam.                           - One diminutive polyp in the descending colon,                            removed with a cold snare. Resected and retrieved.                           - The examination was otherwise normal on direct                            and retroflexion  views. Recommendation:           - Patient has a contact number available for                            emergencies. The signs and symptoms of potential                            delayed complications were discussed with the                            patient. Return to normal activities tomorrow.                            Written discharge instructions were provided to the                            patient.                           - Resume previous diet.                           - Continue present medications.                           -  No repeat colonoscopy is likely - await pathology. Gatha Mayer, MD 11/22/2017 2:27:49 PM This report has been signed electronically.

## 2017-11-22 NOTE — Progress Notes (Signed)
Report given to PACU, vss 

## 2017-11-22 NOTE — Patient Instructions (Addendum)
   I found and removed one tiny poly that looks benign. I will let you know pathology results mail and/or My Chart. I doubt I will recommend another routine repeat colonoscopy.  I appreciate the opportunity to care for you. Gatha Mayer, MD, FACG  YOU HAD AN ENDOSCOPIC PROCEDURE TODAY AT [facility]:   Refer to the procedure report that was given to you for any specific questions about what was found during the examination.  If the procedure report does not answer your questions, please call your gastroenterologist to clarify.  If you requested that your care partner not be given the details of your procedure findings, then the procedure report has been included in a sealed envelope for you to review at your convenience later.  YOU SHOULD EXPECT: Some feelings of bloating in the abdomen. Passage of more gas than usual.  Walking can help get rid of the air that was put into your GI tract during the procedure and reduce the bloating. If you had a lower endoscopy (such as a colonoscopy or flexible sigmoidoscopy) you may notice spotting of blood in your stool or on the toilet paper. If you underwent a bowel prep for your procedure, you may not have a normal bowel movement for a few days.  Please Note:  You might notice some irritation and congestion in your nose or some drainage.  This is from the oxygen used during your procedure.  There is no need for concern and it should clear up in a day or so.  SYMPTOMS TO REPORT IMMEDIATELY:   Following lower endoscopy (colonoscopy or flexible sigmoidoscopy):  Excessive amounts of blood in the stool  Significant tenderness or worsening of abdominal pains  Swelling of the abdomen that is new, acute  Fever of 100F or higher   For urgent or emergent issues, a gastroenterologist can be reached at any hour by calling [phone].   DIET:  We do recommend a small meal at first, but then you may proceed to your regular diet.  Drink  plenty of fluids but you should avoid alcoholic beverages for 24 hours.  ACTIVITY:  You should plan to take it easy for the rest of today and you should NOT DRIVE or use heavy machinery until tomorrow (because of the sedation medicines used during the test).    FOLLOW UP: Our staff will call the number listed on your records the next business day following your procedure to check on you and address any questions or concerns that you may have regarding the information given to you following your procedure. If we do not reach you, we will leave a message.  However, if you are feeling well and you are not experiencing any problems, there is no need to return our call.  We will assume that you have returned to your regular daily activities without incident.  If any biopsies were taken you will be contacted by phone or by letter within the next 1-3 weeks.  Please call us at [phone] if you have not heard about the biopsies in 3 weeks.    SIGNATURES/CONFIDENTIALITY: You and/or your care partner have signed paperwork which will be entered into your electronic medical record.  These signatures attest to the fact that that the information above on your After Visit Summary has been reviewed and is understood.  Full responsibility of the confidentiality of this discharge information lies with you and/or your care-partner.  Polyp information given.

## 2017-11-23 ENCOUNTER — Telehealth: Payer: Self-pay

## 2017-11-23 NOTE — Telephone Encounter (Signed)
  Follow up Call-  Call back number 11/22/2017  Post procedure Call Back phone  # [phone]  Permission to leave phone message Yes  Some recent data might be hidden     Patient questions:  Do you have a fever, pain , or abdominal swelling? No. Pain Score  0 *  Have you tolerated food without any problems? Yes.    Have you been able to return to your normal activities? Yes.    Do you have any questions about your discharge instructions: Diet   No. Medications  No. Follow up visit  No.  Do you have questions or concerns about your Care? No.  Actions: * If pain score is 4 or above: No action needed, pain <4.

## 2017-11-29 ENCOUNTER — Encounter: Payer: Self-pay | Admitting: Internal Medicine

## 2017-11-29 DIAGNOSIS — Z860101 Personal history of adenomatous and serrated colon polyps: Secondary | ICD-10-CM

## 2017-11-29 DIAGNOSIS — Z86010 Personal history of colonic polyps: Secondary | ICD-10-CM

## 2017-11-29 HISTORY — DX: Personal history of colonic polyps: Z86.010

## 2017-11-29 HISTORY — DX: Personal history of adenomatous and serrated colon polyps: Z86.0101

## 2017-11-29 NOTE — Progress Notes (Signed)
Diminutive adenoma No recall - age

## 2017-11-29 NOTE — Progress Notes (Signed)
Diminutive adenoma

## 2017-12-28 ENCOUNTER — Telehealth: Payer: Self-pay

## 2017-12-28 NOTE — Telephone Encounter (Signed)
error 

## 2018-01-06 DIAGNOSIS — S70262A Insect bite (nonvenomous), left hip, initial encounter: Secondary | ICD-10-CM | POA: Diagnosis not present

## 2018-01-06 DIAGNOSIS — R229 Localized swelling, mass and lump, unspecified: Secondary | ICD-10-CM | POA: Diagnosis not present

## 2018-01-12 DIAGNOSIS — R972 Elevated prostate specific antigen [PSA]: Secondary | ICD-10-CM | POA: Diagnosis not present

## 2018-01-12 DIAGNOSIS — N5201 Erectile dysfunction due to arterial insufficiency: Secondary | ICD-10-CM | POA: Diagnosis not present

## 2018-01-18 ENCOUNTER — Other Ambulatory Visit: Payer: Self-pay | Admitting: Urology

## 2018-01-18 DIAGNOSIS — R972 Elevated prostate specific antigen [PSA]: Secondary | ICD-10-CM

## 2018-01-30 ENCOUNTER — Ambulatory Visit
Admission: RE | Admit: 2018-01-30 | Discharge: 2018-01-30 | Disposition: A | Discharge status: Home / Self Care | Payer: Medicare Other | Source: Ambulatory Visit | Attending: Urology | Admitting: Urology

## 2018-01-30 DIAGNOSIS — R972 Elevated prostate specific antigen [PSA]: Secondary | ICD-10-CM | POA: Diagnosis not present

## 2018-01-30 MED ORDER — GADOBENATE DIMEGLUMINE 529 MG/ML IV SOLN
15.0000 mL | Freq: Once | INTRAVENOUS | Status: AC | PRN
Start: 2018-01-30 — End: 2018-01-30
  Administered 2018-01-30: 15 mL via INTRAVENOUS

## 2018-04-08 IMAGING — CT CT L SPINE W/O CM
3 of 13 series · 8 of 34 positions shown, 9 images · non-contrast
Comparison: MRI lumbar spine performed at [REDACTED]
11/17/2015. Plain films lumbar spine performed at [REDACTED] 11/11/2015.

CLINICAL DATA: Low back pain and right lower extremity pain since
October 2015. No known injury. Initial encounter.

EXAM:
CT LUMBAR SPINE WITHOUT CONTRAST
TECHNIQUE: Multidetector CT imaging of the lumbar spine was performed without
intravenous contrast administration. Multiplanar CT image
reconstructions were also generated.

[Series 5: l spine detail · axial · 0.30mm/px · z∈[-62,+15]mm · 2 of 95 slices shown, 3 images]
[im 32/95  soft-tissue]
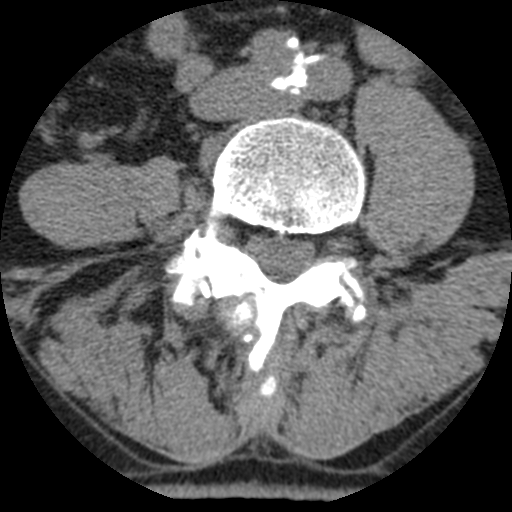
[im 32/95  bone]
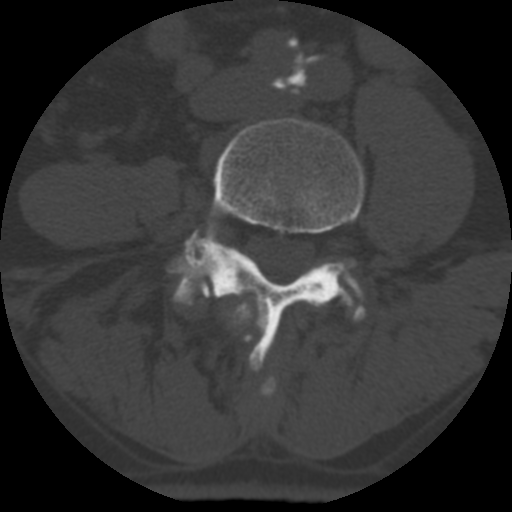
[im 63/95  bone]
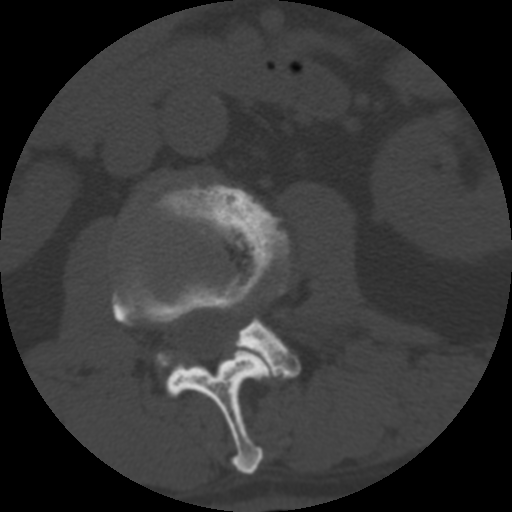

[Series 200: cor · coronal · 0.47mm/px · 1 of 63 slices shown]
[im 32/63  bone]
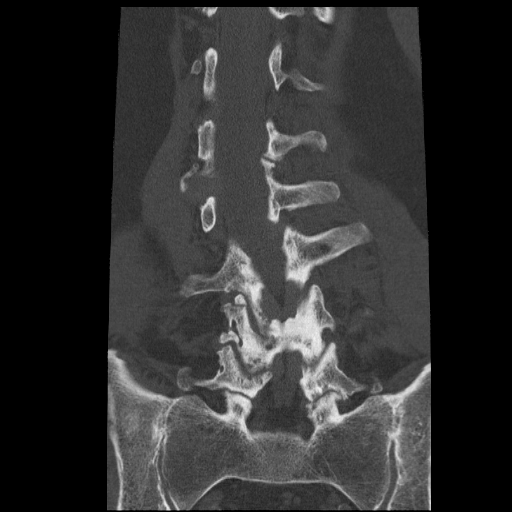

[Series 203: sag 2 · sagittal · 0.47mm/px · 5 of 67 slices shown]
[im 14/67  bone]
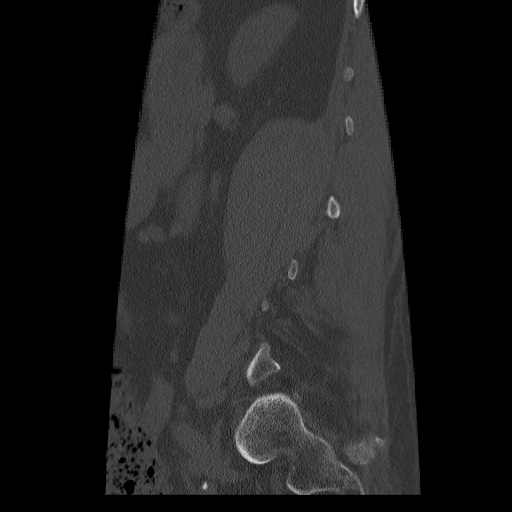
[im 20/67  soft-tissue]
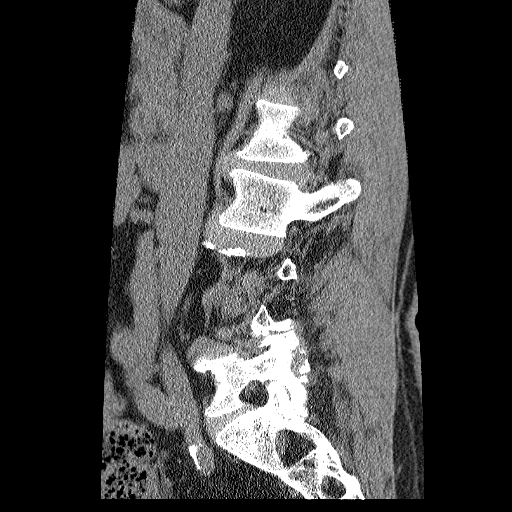
[im 27/67  bone]
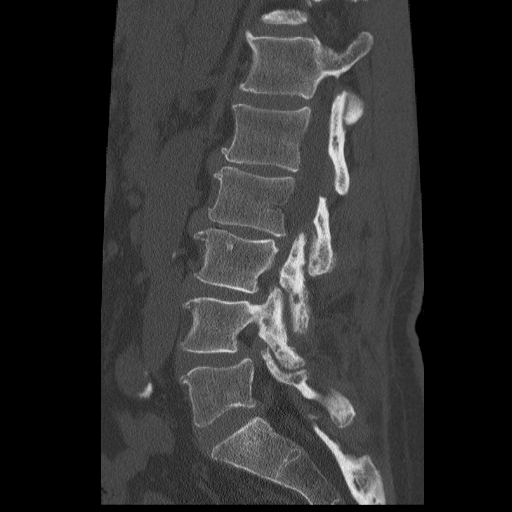
[im 40/67  bone]
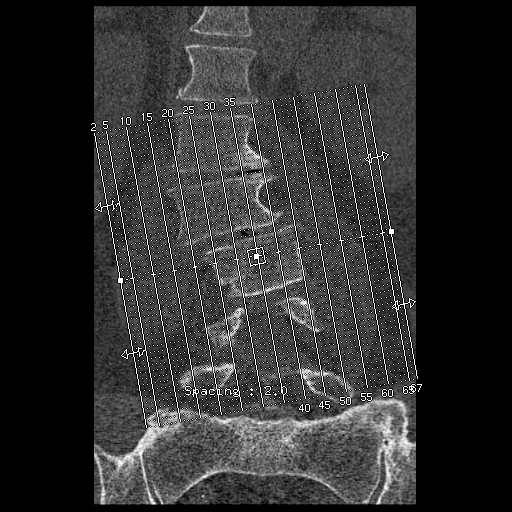
[im 53/67  bone]
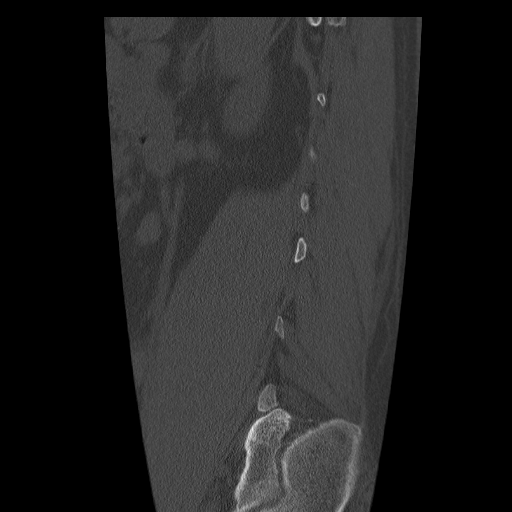

[8 of 34 positions shown; findings below may reference images not displayed]

FINDINGS: Vertebral body height is maintained. There is convex left scoliosis
with the apex at L2-3. The L2 vertebral body is 1.2 cm right lateral
listhesed relative to L3. There is also trace anterolisthesis L4 on
L5 due to facet degenerative disease. Alignment is otherwise
unremarkable. Degenerative change about the sacroiliac joints which
are ankylosed is noted. Imaged intra-abdominal contents demonstrate
aortoiliac atherosclerosis without aneurysm.

T11-12:  Negative.

T12-L1:  Mild facet degenerative change.  Otherwise negative.

L1-2: There is some facet degenerative change with endplate spurring
eccentric to the left. The central canal and neural foramina appear
open.

L2-3: The patient has a disc bulge with endplate spurring. Small
locule of gas is noted in the disc in the right lateral recess.
There is moderate to moderately severe central canal narrowing. The
right foramen is open. Moderate to moderately severe left foraminal
narrowing is identified. Moderate to moderately severe bilateral
facet degenerative change is seen.

L3-4: Facet degenerative change, ligamentum flavum thickening and a
shallow disc bulge eccentric to the right are seen. Moderate central
canal stenosis is present and there is narrowing in the right
lateral recess. The foramina are open.

L4-5: The disc is uncovered with a shallow bulge. Moderately severe
to severe facet degenerative change is worse on the right.
Ligamentum flavum thickening is identified. There is moderately
severe central canal stenosis. Marked right foraminal narrowing is
seen. The left foramen appears open.

L5-S1: Shallow disc bulge is seen and there is bilateral facet
degenerative change. The central canal and foramina are open.
IMPRESSION: Multilevel spondylosis appears worst at L4-5 where facet arthropathy
results in trace anterolisthesis. Shallow disc bulge and ligamentum
flavum thickening cause moderately severe central canal stenosis at
this level and there is also marked right foraminal narrowing.

Moderate central canal stenosis at L3-4 where there is also
narrowing in the right lateral recess which could impact the
descending right L4 root.

Moderate to moderately severe central canal and left foraminal
narrowing L2-3.

Marked convex left scoliosis with 1.2 cm right lateral listhesis L2
on L3.

## 2018-04-16 ENCOUNTER — Other Ambulatory Visit: Payer: Self-pay | Admitting: Family Medicine

## 2018-04-16 DIAGNOSIS — E785 Hyperlipidemia, unspecified: Secondary | ICD-10-CM

## 2018-04-16 DIAGNOSIS — E1169 Type 2 diabetes mellitus with other specified complication: Secondary | ICD-10-CM

## 2018-04-16 DIAGNOSIS — I10 Essential (primary) hypertension: Secondary | ICD-10-CM

## 2018-05-03 ENCOUNTER — Encounter: Payer: Self-pay | Admitting: Family Medicine

## 2018-05-03 ENCOUNTER — Ambulatory Visit (INDEPENDENT_AMBULATORY_CARE_PROVIDER_SITE_OTHER): Payer: Medicare Other | Admitting: Family Medicine

## 2018-05-03 VITALS — BP 136/82 | HR 65 | Temp 97.6°F | Wt 172.6 lb

## 2018-05-03 DIAGNOSIS — I10 Essential (primary) hypertension: Secondary | ICD-10-CM

## 2018-05-03 DIAGNOSIS — E118 Type 2 diabetes mellitus with unspecified complications: Secondary | ICD-10-CM

## 2018-05-03 DIAGNOSIS — E1169 Type 2 diabetes mellitus with other specified complication: Secondary | ICD-10-CM

## 2018-05-03 DIAGNOSIS — E1159 Type 2 diabetes mellitus with other circulatory complications: Secondary | ICD-10-CM | POA: Diagnosis not present

## 2018-05-03 DIAGNOSIS — E785 Hyperlipidemia, unspecified: Secondary | ICD-10-CM

## 2018-05-03 DIAGNOSIS — N400 Enlarged prostate without lower urinary tract symptoms: Secondary | ICD-10-CM | POA: Insufficient documentation

## 2018-05-03 DIAGNOSIS — R972 Elevated prostate specific antigen [PSA]: Secondary | ICD-10-CM

## 2018-05-03 LAB — CBC WITH DIFFERENTIAL/PLATELET
Basophils Absolute: 0 10*3/uL (ref 0.0–0.2)
Basos: 1 %
EOS (ABSOLUTE): 0.3 10*3/uL (ref 0.0–0.4)
Eos: 5 %
Hematocrit: 37.4 % — ABNORMAL LOW (ref 37.5–51.0)
Hemoglobin: 11.8 g/dL — ABNORMAL LOW (ref 13.0–17.7)
IMMATURE GRANULOCYTES: 0 %
Immature Grans (Abs): 0 10*3/uL (ref 0.0–0.1)
LYMPHS: 31 %
Lymphocytes Absolute: 1.6 10*3/uL (ref 0.7–3.1)
MCH: 23.9 pg — ABNORMAL LOW (ref 26.6–33.0)
MCHC: 31.6 g/dL (ref 31.5–35.7)
MCV: 76 fL — AB (ref 79–97)
MONOS ABS: 0.4 10*3/uL (ref 0.1–0.9)
Monocytes: 7 %
NEUTROS PCT: 56 %
Neutrophils Absolute: 3 10*3/uL (ref 1.4–7.0)
Platelets: 218 10*3/uL (ref 150–450)
RBC: 4.93 x10E6/uL (ref 4.14–5.80)
RDW: 15.8 % — ABNORMAL HIGH (ref 12.3–15.4)
WBC: 5.4 10*3/uL (ref 3.4–10.8)

## 2018-05-03 LAB — COMPREHENSIVE METABOLIC PANEL
ALBUMIN: 4.5 g/dL (ref 3.5–4.8)
ALK PHOS: 99 IU/L (ref 39–117)
ALT: 12 IU/L (ref 0–44)
AST: 14 IU/L (ref 0–40)
Albumin/Globulin Ratio: 2 (ref 1.2–2.2)
BILIRUBIN TOTAL: 0.5 mg/dL (ref 0.0–1.2)
BUN/Creatinine Ratio: 13 (ref 10–24)
BUN: 15 mg/dL (ref 8–27)
CHLORIDE: 101 mmol/L (ref 96–106)
CO2: 26 mmol/L (ref 20–29)
CREATININE: 1.12 mg/dL (ref 0.76–1.27)
Calcium: 9.9 mg/dL (ref 8.6–10.2)
GFR calc Af Amer: 74 mL/min/{1.73_m2} (ref >59)
GFR calc non Af Amer: 64 mL/min/{1.73_m2} (ref >59)
GLOBULIN, TOTAL: 2.2 g/dL (ref 1.5–4.5)
Glucose: 150 mg/dL — ABNORMAL HIGH (ref 65–99)
POTASSIUM: 5.1 mmol/L (ref 3.5–5.2)
SODIUM: 141 mmol/L (ref 134–144)
TOTAL PROTEIN: 6.7 g/dL (ref 6.0–8.5)

## 2018-05-03 LAB — LIPID PANEL
CHOLESTEROL TOTAL: 167 mg/dL (ref 100–199)
Chol/HDL Ratio: 2.1 ratio (ref 0.0–5.0)
HDL: 81 mg/dL (ref >39)
LDL Calculated: 71 mg/dL (ref 0–99)
TRIGLYCERIDES: 75 mg/dL (ref 0–149)
VLDL Cholesterol Cal: 15 mg/dL (ref 5–40)

## 2018-05-03 LAB — POCT GLYCOSYLATED HEMOGLOBIN (HGB A1C): Hemoglobin A1C: 7.9 % — AB (ref 4.0–5.6)

## 2018-05-03 NOTE — Progress Notes (Signed)
  Subjective:    Patient ID: Scott Parks, male    DOB: [birth year], 77 y.o.   MRN: [id number]  Scott Parks is a 77 y.o. male who presents for follow-up of Type 2 diabetes mellitus.  Patient is checking home blood sugars.   Home blood sugar records: meter records between 118 and 202  how often are blood sugar readings being checked: qod Current symptoms/problems include none and have been unchanged. Daily foot checks: yes  Any foot concerns: none Last eye exam: one year ago reg eye exam Exercise: reg weights cardio, martial arts He does have a history of elevated PSA and has had biopsy as well as an MRI both of which was negative.  He is followed regularly for that.  He did show that he has BPH.  He continues on his atorvastatin without difficulty and is also taking quinapril.  Continues on metformin.  No complaints on any of these medications.  He does exercise regularly.  His home life is going well.  He has no difficulty with erectile dysfunction.  Eye exam is scheduled for October.    The following portions of the patient's history were reviewed and updated as appropriate: allergies, current medications, past medical history, past social history and problem list.  ROS as in subjective above.     Objective:    Physical Exam Alert and in no distress foot exam is normal   Lab Review Diabetic Labs Latest Ref Rng & Units 08/31/2017 03/08/2017 11/02/2016 07/06/2016 03/30/2016  HbA1c - 7.8 7.5 7.2 8.1 -  Microalbumin mg/L 12.3 15.1 - - -  Micro/Creat Ratio - <50 7.3 - - -  Chol <200 mg/dL - 158 - - -  HDL >40 mg/dL - 81 - - -  Calc LDL <100 mg/dL - 66 - - -  Triglycerides <150 mg/dL - 57 - - -  Creatinine 0.70 - 1.18 mg/dL - 1.08 - - 0.98   BP/Weight 11/22/2017 11/03/2017 08/31/2017 03/08/2017 5/63/8937  Systolic BP 342 - 876 811 572  Diastolic BP 68 - 84 70 78  Wt. (Lbs) 175 175.6 178.6 176 181  BMI 25.84 25.93 26.37 25.99 26.73   Foot/eye exam completion dates Latest Ref Rng & Units  05/03/2016 02/29/2016  Eye Exam No Retinopathy No Retinopathy -  Foot Form Completion - - Done  A1C is 7.9  Scott Parks  reports that he quit smoking about 33 years ago. His smoking use included cigarettes. He has never used smokeless tobacco. He reports that he drinks alcohol. He reports that he does not use drugs.     Assessment & Plan:    Type 2 diabetes mellitus with complication, without long-term current use of insulin (HCC) - Plan: POCT glycosylated hemoglobin (Hb A1C), CBC with Differential/Platelet, Comprehensive metabolic panel, Lipid panel, POCT UA - Microalbumin  Hypertension associated with diabetes ([place]) - Plan: CBC with Differential/Platelet, Comprehensive metabolic panel  Hyperlipidemia associated with type 2 diabetes mellitus ([place]) - Plan: Lipid panel  Elevated PSA  Benign prostatic hyperplasia without lower urinary tract symptoms   1. Rx changes: none 2. Education: Reviewed 'ABCs' of diabetes management (respective goals in parentheses):  A1C (<7), blood pressure (<130/80), and cholesterol (LDL <100). 3. Compliance at present is estimated to be good. Efforts to improve compliance (if necessary) will be directed at No change. 4. Follow up: 4 months I encouraged him to continue to take good care of himself.  Immunization for influenza offered and patient refused.

## 2018-05-11 IMAGING — CR DG CHEST 2V
2 series · 2 of 2 positions shown · non-contrast
Comparison: None.

CLINICAL DATA: Preop lumbar surgery.  Hypertension, diabetes.

EXAM:
CHEST  2 VIEW

[chest pa]
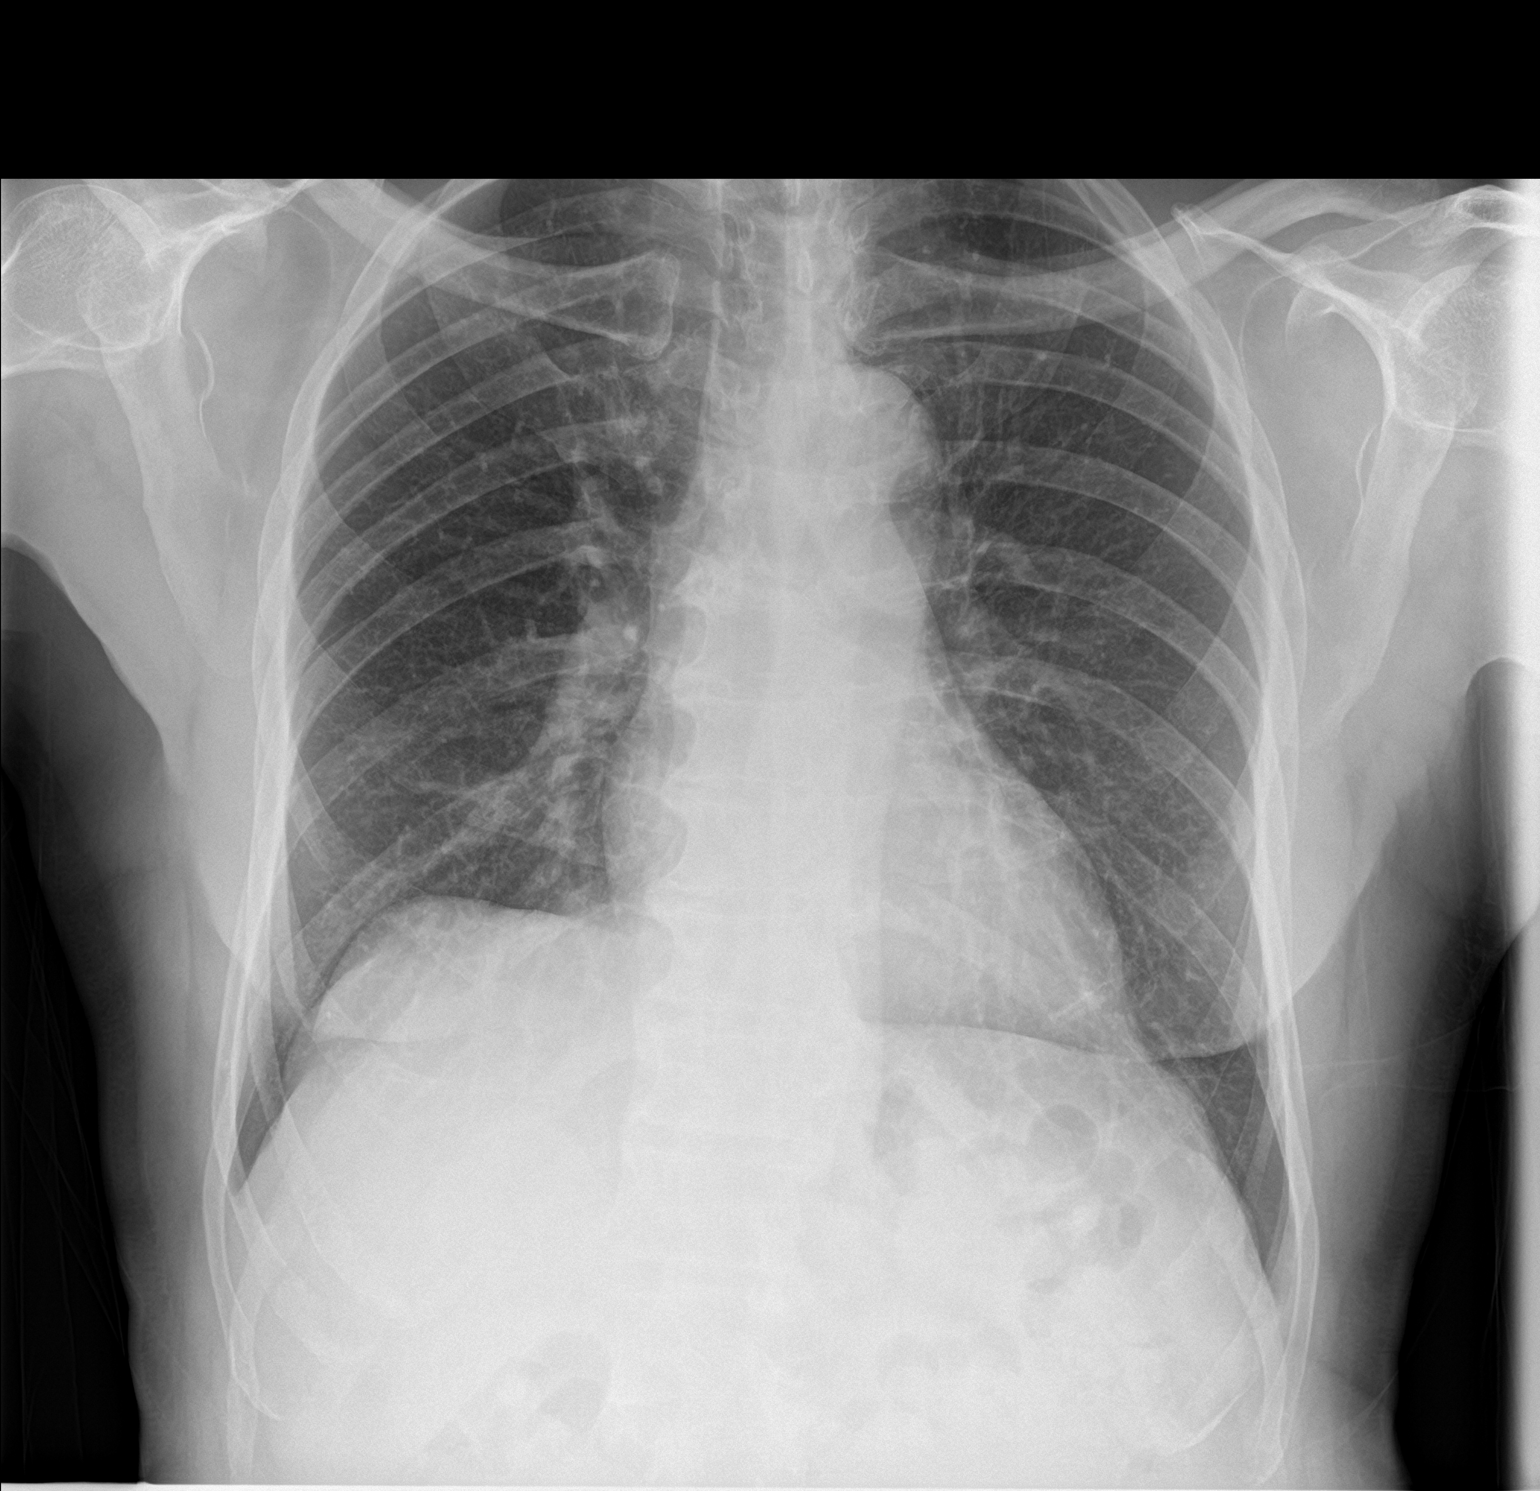

[chest lat]
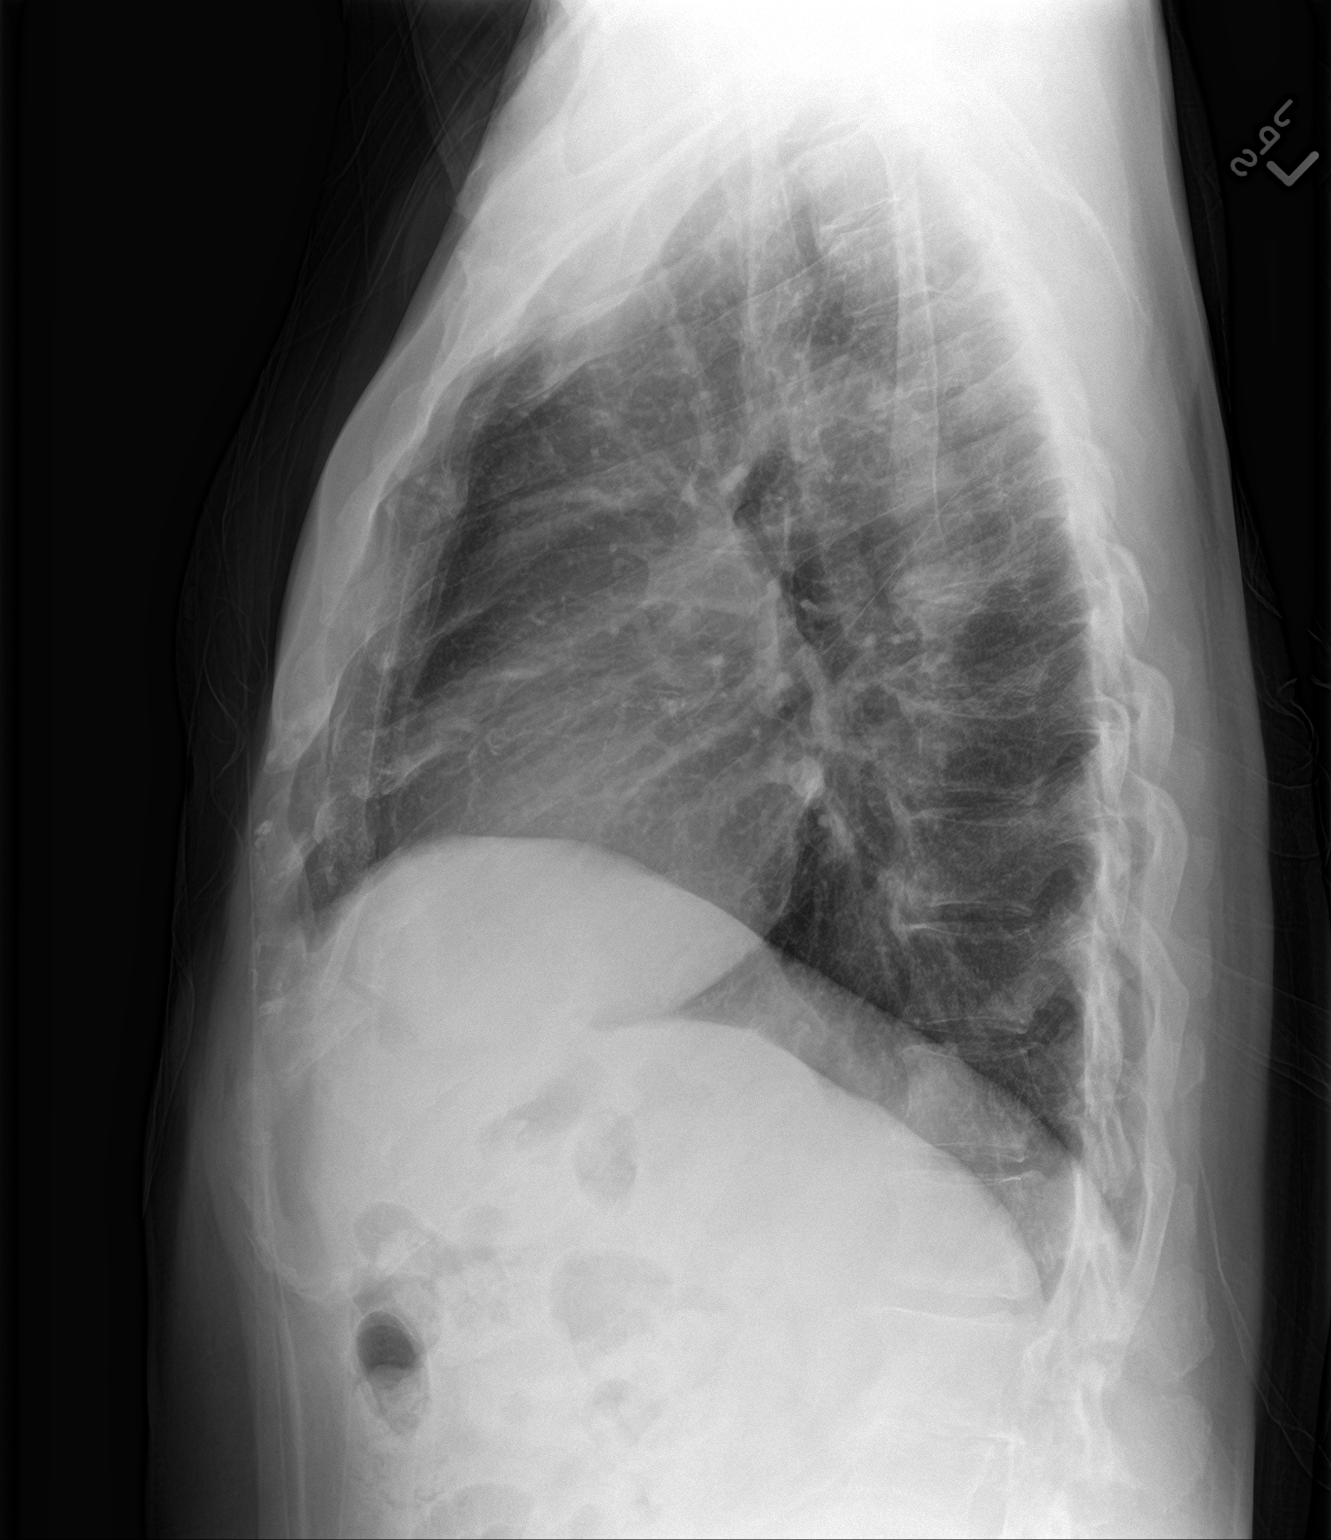

[2 of 2 positions shown; findings below may reference images not displayed]

FINDINGS: Heart and mediastinal contours are within normal limits. No focal
opacities or effusions. No acute bony abnormality.
IMPRESSION: No active cardiopulmonary disease.

## 2018-05-14 ENCOUNTER — Telehealth: Payer: Self-pay

## 2018-05-14 MED ORDER — METFORMIN HCL 1000 MG PO TABS: 1000.0000 mg | ORAL_TABLET | Freq: Two times a day (BID) | ORAL | 0 refills | Status: DC

## 2018-05-14 NOTE — Telephone Encounter (Signed)
Pt has called stating he needs a refill on Metformin

## 2018-05-19 IMAGING — RF DG C-ARM 61-120 MIN
1 series · 3 of 3 positions shown · non-contrast
Comparison: None.

CLINICAL DATA: L4-5 PLIF

EXAM:
LUMBAR SPINE - 2-3 VIEW; DG C-ARM 61-120 MIN

[Series 1: run · 3 of 3 slices shown]
[im 1/3]
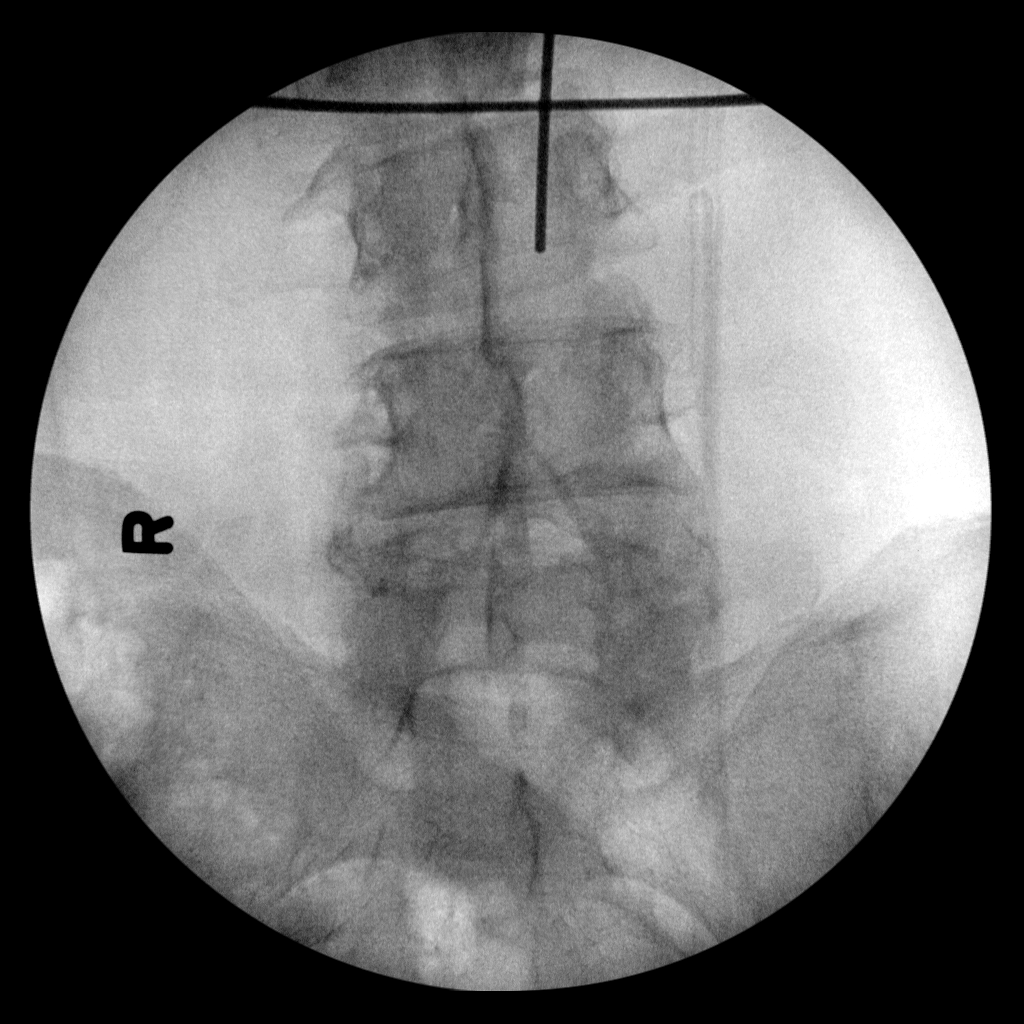
[im 2/3]
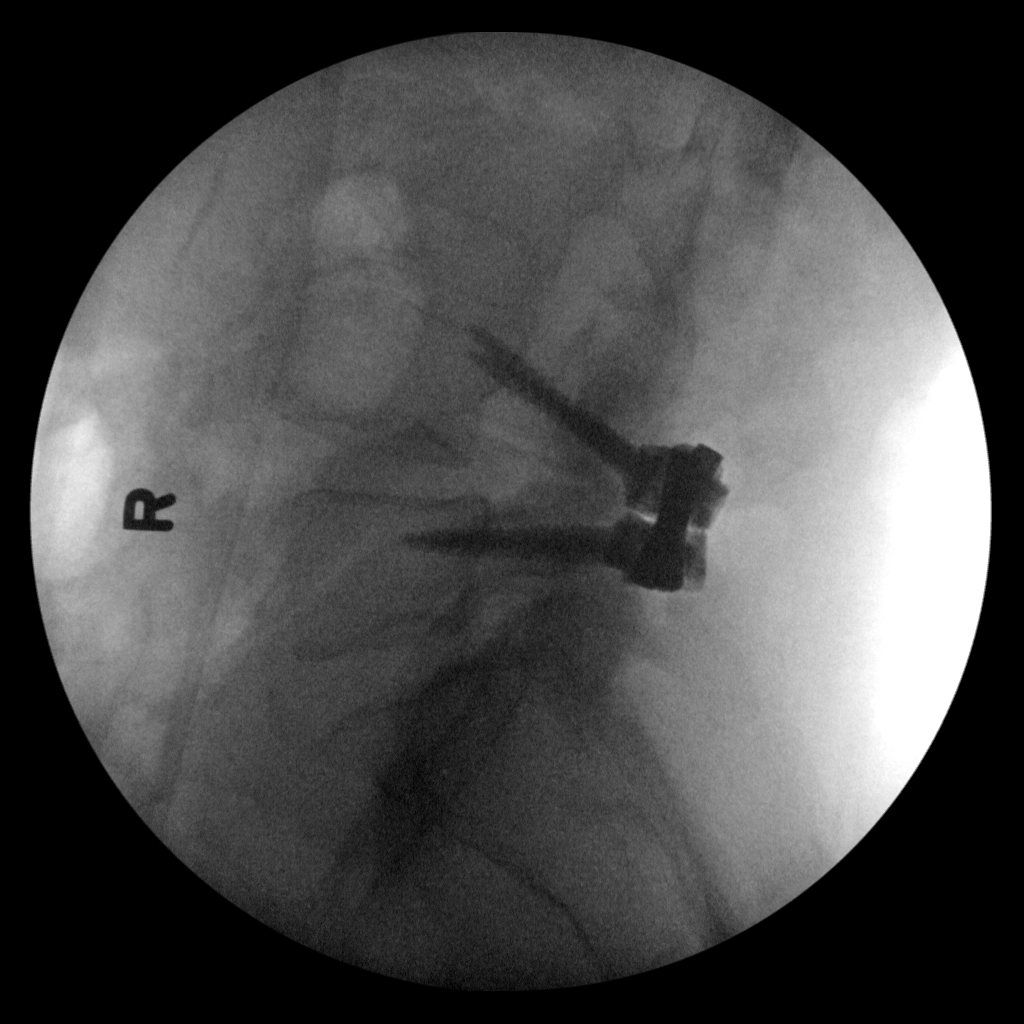
[im 3/3]
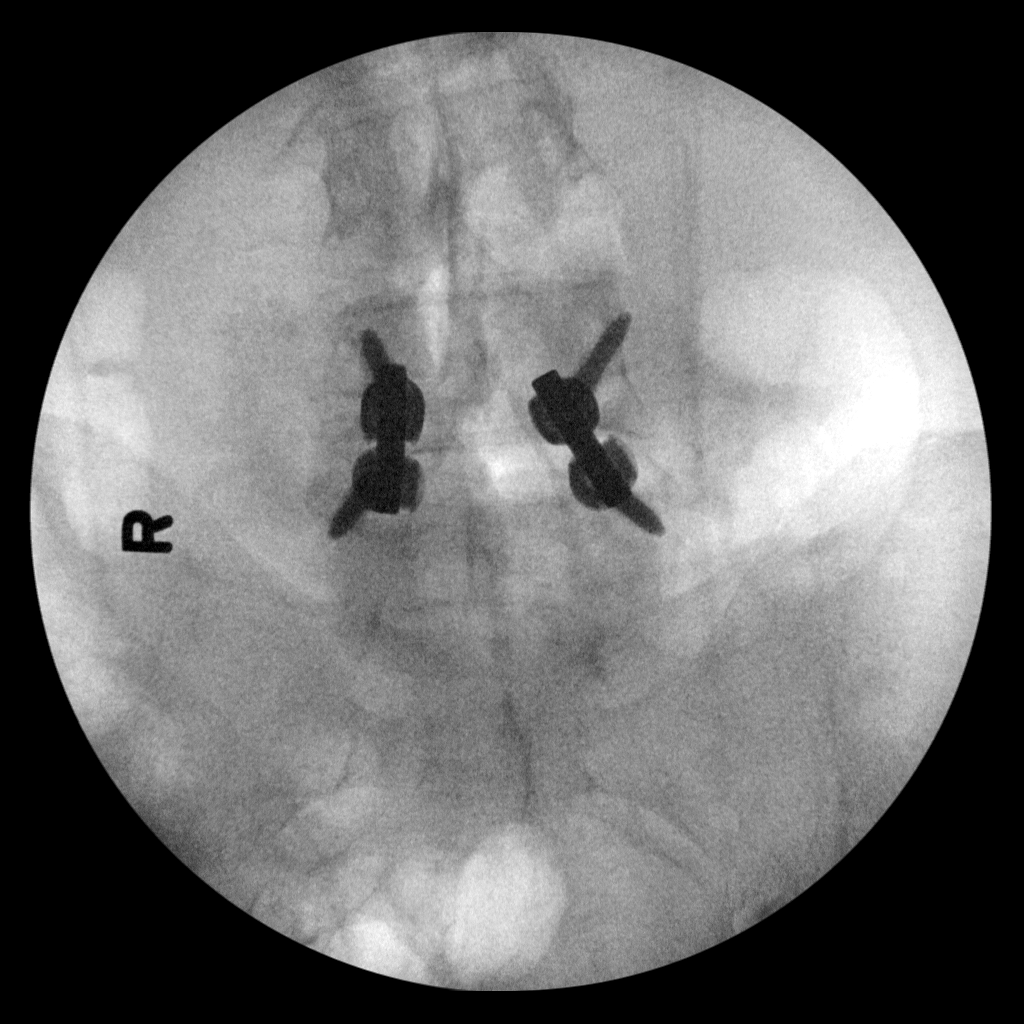

[3 of 3 positions shown; findings below may reference images not displayed]

FLUOROSCOPY TIME:  Radiation Exposure Index (as provided by the
fluoroscopic device): Not available

If the device does not provide the exposure index:

Fluoroscopy Time:  29 seconds

Number of Acquired Images:  3
FINDINGS: Pedicle screws are noted at L4 and L5 with posterior fixation.
IMPRESSION: Changes consistent with L4-5 fusion.

## 2018-07-02 DIAGNOSIS — M5416 Radiculopathy, lumbar region: Secondary | ICD-10-CM | POA: Diagnosis not present

## 2018-07-02 DIAGNOSIS — Z6825 Body mass index (BMI) 25.0-25.9, adult: Secondary | ICD-10-CM | POA: Diagnosis not present

## 2018-07-02 DIAGNOSIS — I10 Essential (primary) hypertension: Secondary | ICD-10-CM | POA: Diagnosis not present

## 2018-07-04 ENCOUNTER — Other Ambulatory Visit (INDEPENDENT_AMBULATORY_CARE_PROVIDER_SITE_OTHER): Payer: Medicare Other

## 2018-07-04 DIAGNOSIS — Z23 Encounter for immunization: Secondary | ICD-10-CM | POA: Diagnosis not present

## 2018-07-04 DIAGNOSIS — E118 Type 2 diabetes mellitus with unspecified complications: Secondary | ICD-10-CM | POA: Diagnosis not present

## 2018-07-05 LAB — CBC WITH DIFFERENTIAL/PLATELET
BASOS ABS: 0 10*3/uL (ref 0.0–0.2)
BASOS: 0 %
EOS (ABSOLUTE): 0 10*3/uL (ref 0.0–0.4)
EOS: 0 %
HEMATOCRIT: 40 % (ref 37.5–51.0)
HEMOGLOBIN: 12.6 g/dL — AB (ref 13.0–17.7)
IMMATURE GRANS (ABS): 0 10*3/uL (ref 0.0–0.1)
Immature Granulocytes: 0 %
LYMPHS: 14 %
Lymphocytes Absolute: 1.2 10*3/uL (ref 0.7–3.1)
MCH: 23.3 pg — AB (ref 26.6–33.0)
MCHC: 31.5 g/dL (ref 31.5–35.7)
MCV: 74 fL — AB (ref 79–97)
MONOCYTES: 7 %
Monocytes Absolute: 0.6 10*3/uL (ref 0.1–0.9)
NEUTROS ABS: 6.8 10*3/uL (ref 1.4–7.0)
Neutrophils: 79 %
PLATELETS: 203 10*3/uL (ref 150–450)
RBC: 5.41 x10E6/uL (ref 4.14–5.80)
RDW: 15.7 % — ABNORMAL HIGH (ref 12.3–15.4)
WBC: 8.7 10*3/uL (ref 3.4–10.8)

## 2018-07-17 DIAGNOSIS — M5416 Radiculopathy, lumbar region: Secondary | ICD-10-CM | POA: Diagnosis not present

## 2018-08-02 DIAGNOSIS — M48061 Spinal stenosis, lumbar region without neurogenic claudication: Secondary | ICD-10-CM | POA: Diagnosis not present

## 2018-08-02 DIAGNOSIS — M47816 Spondylosis without myelopathy or radiculopathy, lumbar region: Secondary | ICD-10-CM | POA: Diagnosis not present

## 2018-08-02 DIAGNOSIS — M5416 Radiculopathy, lumbar region: Secondary | ICD-10-CM | POA: Diagnosis not present

## 2018-08-07 ENCOUNTER — Telehealth: Payer: Self-pay | Admitting: Family Medicine

## 2018-08-07 DIAGNOSIS — E785 Hyperlipidemia, unspecified: Secondary | ICD-10-CM

## 2018-08-07 DIAGNOSIS — E1169 Type 2 diabetes mellitus with other specified complication: Secondary | ICD-10-CM

## 2018-08-07 DIAGNOSIS — I10 Essential (primary) hypertension: Secondary | ICD-10-CM

## 2018-08-07 MED ORDER — QUINAPRIL HCL 40 MG PO TABS: 40.0000 mg | ORAL_TABLET | Freq: Every day | ORAL | 0 refills | Status: DC

## 2018-08-07 MED ORDER — ATORVASTATIN CALCIUM 40 MG PO TABS: 40.0000 mg | ORAL_TABLET | Freq: Every day | ORAL | 0 refills | Status: DC

## 2018-08-07 NOTE — Telephone Encounter (Signed)
Pts wife called and stated that they were out of town and forgot husbands Accupro and Lipitor. Pt wanted to know if prescriptions can be sent to [facility] on [address]. [place] [address]. Pharmacy number is [phone] Pt can be reached at [phone]

## 2018-08-07 NOTE — Telephone Encounter (Signed)
Sent in med to pharmacy 

## 2018-08-12 ENCOUNTER — Other Ambulatory Visit: Payer: Self-pay | Admitting: Family Medicine

## 2018-08-16 DIAGNOSIS — M5416 Radiculopathy, lumbar region: Secondary | ICD-10-CM | POA: Diagnosis not present

## 2018-08-17 DIAGNOSIS — M5416 Radiculopathy, lumbar region: Secondary | ICD-10-CM | POA: Insufficient documentation

## 2018-08-29 ENCOUNTER — Encounter: Payer: Self-pay | Admitting: Family Medicine

## 2018-08-29 ENCOUNTER — Ambulatory Visit (INDEPENDENT_AMBULATORY_CARE_PROVIDER_SITE_OTHER): Payer: Medicare Other | Admitting: Family Medicine

## 2018-08-29 VITALS — BP 110/80 | HR 67 | Temp 97.7°F | Wt 171.6 lb

## 2018-08-29 DIAGNOSIS — E118 Type 2 diabetes mellitus with unspecified complications: Secondary | ICD-10-CM

## 2018-08-29 DIAGNOSIS — E1159 Type 2 diabetes mellitus with other circulatory complications: Secondary | ICD-10-CM | POA: Diagnosis not present

## 2018-08-29 DIAGNOSIS — E785 Hyperlipidemia, unspecified: Secondary | ICD-10-CM | POA: Diagnosis not present

## 2018-08-29 DIAGNOSIS — I10 Essential (primary) hypertension: Secondary | ICD-10-CM

## 2018-08-29 DIAGNOSIS — E1169 Type 2 diabetes mellitus with other specified complication: Secondary | ICD-10-CM

## 2018-08-29 LAB — POCT GLYCOSYLATED HEMOGLOBIN (HGB A1C): Hemoglobin A1C: 9.4 % — AB (ref 4.0–5.6)

## 2018-08-29 NOTE — Progress Notes (Signed)
  Subjective:    Patient ID: Scott Parks, male    DOB: 12-16-1940, 78 y.o.   MRN: [id number]  Scott Parks is a 78 y.o. male who presents for follow-up of Type 2 diabetes mellitus.  Patient is checking home blood sugars.   Home blood sugar records: meter records How often is blood sugars being checked: QOD fasting and 2 hours post meal 88-230 Current symptoms/problems include none at this time. Daily foot checks: yes   Any foot concerns: none Last eye exam: none at this time Exercise: Over the last several months he has been unable to exercise due to back pain.  This has subsequently mostly resolved and now he is exercising three times a week, work out at gym.  He does plan to have an epidural which should also help with his back pain. He continues on metformin as well as atorvastatin, quinapril and omega-3.  The following portions of the patient's history were reviewed and updated as appropriate: allergies, current medications, past medical history, past social history and problem list.  ROS as in subjective above.     Objective:    Physical Exam Alert and in no distress otherwise not examined.   Lab Review Diabetic Labs Latest Ref Rng & Units 05/03/2018 08/31/2017 03/08/2017 11/02/2016 07/06/2016  HbA1c 4.0 - 5.6 % 7.9(A) 7.8 7.5 7.2 8.1  Microalbumin mg/L - 12.3 15.1 - -  Micro/Creat Ratio - - <50 7.3 - -  Chol 100 - 199 mg/dL 167 - 158 - -  HDL >39 mg/dL 81 - 81 - -  Calc LDL 0 - 99 mg/dL 71 - 66 - -  Triglycerides 0 - 149 mg/dL 75 - 57 - -  Creatinine 0.76 - 1.27 mg/dL 1.12 - 1.08 - -   BP/Weight 05/03/2018 11/22/2017 11/03/2017 08/31/2017 09/18/5595  Systolic BP 416 384 - 536 468  Diastolic BP 82 68 - 84 70  Wt. (Lbs) 172.6 175 175.6 178.6 176  BMI 25.49 25.84 25.93 26.37 25.99   Foot/eye exam completion dates Latest Ref Rng & Units 05/03/2018 05/03/2016  Eye Exam No Retinopathy - No Retinopathy  Foot Form Completion - Done -    Scott Parks  reports that he quit smoking about  34 years ago. His smoking use included cigarettes. He has never used smokeless tobacco. He reports current alcohol use. He reports that he does not use drugs.     Assessment & Plan:    Type 2 diabetes mellitus with complication, without long-term current use of insulin (HCC) - Plan: POCT glycosylated hemoglobin (Hb A1C)  Hyperlipidemia associated with type 2 diabetes mellitus ([place])  Hypertension associated with diabetes ([place])   1. Rx changes: none although his A1c is up, this is probably due to his lack of physical activity which she is now in the process of changing.  Hopefully this will improve his A1c and if not, discussed the use of an injectable weekly medication regimen. 2. Education: Reviewed 'ABCs' of diabetes management (respective goals in parentheses):  A1C (<7), blood pressure (<130/80), and cholesterol (LDL <100). 3. Compliance at present is estimated to be fair. Efforts to improve compliance (if necessary) will be directed at increased exercise. 4. Follow up: 4 months

## 2018-09-06 DIAGNOSIS — M5416 Radiculopathy, lumbar region: Secondary | ICD-10-CM | POA: Diagnosis not present

## 2018-10-02 DIAGNOSIS — M5416 Radiculopathy, lumbar region: Secondary | ICD-10-CM | POA: Diagnosis not present

## 2018-10-30 ENCOUNTER — Other Ambulatory Visit: Payer: Self-pay | Admitting: Neurological Surgery

## 2018-10-30 DIAGNOSIS — M5416 Radiculopathy, lumbar region: Secondary | ICD-10-CM | POA: Diagnosis not present

## 2018-11-21 NOTE — Pre-Procedure Instructions (Signed)
Scott Parks  11/21/2018      [facility] - [address], [place] [address] Phone: [phone] Fax: [phone]  [facility] [place] [place] [address] [place] [address] Phone: [phone] Fax: [phone]  [id number] [place], [place] [address] [place] [address] Phone: [phone] Fax: [place], [place] [address] [place] [address] Phone: [phone] Fax: [phone]  [facility] [address] [place] - [address] [place] [address] Phone: [phone] Fax: [place] [address] [place] [address] Phone: [phone] Fax: [phone]    Your procedure is scheduled on Friday, 11/30/2018.  Report to [facility] Admitting-Entrance A at 5:35 am.  Call this number if you have problems the morning of surgery:  [phone]   Remember:  Do not eat or drink after midnight (Thursday, 4/16).    Take these medicines the morning of surgery with A SIP OF WATER: atorvastatin (LIPITOR)    WHAT DO I DO ABOUT MY DIABETES MEDICATION?  Marland Kitchen Do not take oral diabetes medicines (pills) the morning of surgery. (metformin)  How to Manage Your Diabetes Before and After Surgery  How do I manage my blood sugar before surgery? . Check your blood sugar at least 4 times a day, starting 2 days before surgery, to make sure that the level is not too high or low. o Check your blood sugar the morning of your surgery when you wake up and every 2 hours until you get to the Short Stay unit. . If your blood sugar is less than 70 mg/dL, you will need to treat for low  blood sugar: o Do not take insulin. o Treat a low blood sugar (less than 70 mg/dL) with  cup of clear juice (cranberry or apple), 4 glucose tablets, OR glucose gel. o Recheck blood sugar in 15 minutes after treatment (to make sure it is greater than 70 mg/dL). If your blood sugar is not greater than 70 mg/dL on recheck, call [phone] for further instructions. . Report your blood sugar to the short stay nurse when you get to Short Stay.  STOP NOW taking any Aspirin (unless otherwise instructed by your surgeon), Aleve, Naproxen, Ibuprofen, Motrin, Advil, Goody's, BC's, all herbal medications, fish oil, and all vitamins.     Do not wear jewelry, make-up or nail polish.  Do not wear lotions, powders, or cologne, or deodorant.  Men may shave face and neck.  Do not bring valuables to the hospital.  [facility] is not responsible for any belongings or valuables.  Contacts, dentures or bridgework may not be worn into surgery.  Leave your suitcase in the car.  After surgery it may be brought to your room.  For patients admitted to the hospital, discharge time will be determined by your treatment team.  Patients discharged the day of surgery will not be allowed to drive home.   Special instructions:   [place]- Preparing For Surgery  Before surgery, you can play an important role. Because skin is not sterile, your skin needs to be as free of germs as possible. You can reduce the number of germs on your  skin by washing with CHG (chlorahexidine gluconate) Soap before surgery.  CHG is an antiseptic cleaner which kills germs and bonds with the skin to continue killing germs even after washing.    Oral Hygiene is also important to reduce your risk of infection.  Remember - BRUSH YOUR TEETH THE MORNING OF SURGERY WITH YOUR REGULAR TOOTHPASTE  Please do not use if you have an allergy to CHG or antibacterial soaps. If your skin becomes reddened/irritated stop using the CHG.  Do not shave  (including legs and underarms) for at least 48 hours prior to first CHG shower. It is OK to shave your face.  Please follow these instructions carefully.   1. Shower the [employer] BEFORE SURGERY (Thurs) and the MORNING OF SURGERY (Fri) with CHG.   2. If you chose to wash your hair, wash your hair first as usual with your normal shampoo.  3. After you shampoo, rinse your hair and body thoroughly to remove the shampoo.  4. Use CHG as you would any other liquid soap. You can apply CHG directly to the skin and wash gently with a scrungie or a clean washcloth.   5. Apply the CHG Soap to your body ONLY FROM THE NECK DOWN.  Do not use on open wounds or open sores. Avoid contact with your eyes, ears, mouth and genitals (private parts). Wash Face and genitals (private parts)  with your normal soap.  6. Wash thoroughly, paying special attention to the area where your surgery will be performed.  7. Thoroughly rinse your body with warm water from the neck down.  8. DO NOT shower/wash with your normal soap after using and rinsing off the CHG Soap.  9. Pat yourself dry with a CLEAN TOWEL.  10. Wear CLEAN PAJAMAS to bed the night before surgery, wear comfortable clothes the morning of surgery  11. Place CLEAN SHEETS on your bed the night of your first shower and DO NOT SLEEP WITH PETS.   Day of Surgery:  Shower as instructed above. Do not apply any deodorants/lotions.  Please wear clean clothes to the hospital/surgery center.   Remember to brush your teeth WITH YOUR REGULAR TOOTHPASTE.    Please read over the following fact sheets that you were given.  Patient informed of the hospital visitation restriction  policy that is now currently in place.

## 2018-11-22 ENCOUNTER — Encounter (HOSPITAL_COMMUNITY): Payer: Self-pay

## 2018-11-22 ENCOUNTER — Ambulatory Visit (HOSPITAL_COMMUNITY)
Admission: RE | Admit: 2018-11-22 | Discharge: 2018-11-22 | Disposition: A | Discharge status: Home / Self Care | Payer: Medicare Other | Source: Ambulatory Visit | Attending: Neurological Surgery | Admitting: Neurological Surgery

## 2018-11-22 ENCOUNTER — Other Ambulatory Visit: Payer: Self-pay

## 2018-11-22 ENCOUNTER — Encounter (HOSPITAL_COMMUNITY)
Admission: RE | Admit: 2018-11-22 | Discharge: 2018-11-22 | Disposition: A | Discharge status: Home / Self Care | Payer: Medicare Other | Source: Ambulatory Visit | Attending: Neurological Surgery | Admitting: Neurological Surgery

## 2018-11-22 DIAGNOSIS — Z01818 Encounter for other preprocedural examination: Secondary | ICD-10-CM | POA: Insufficient documentation

## 2018-11-22 DIAGNOSIS — M4310 Spondylolisthesis, site unspecified: Secondary | ICD-10-CM

## 2018-11-22 LAB — BASIC METABOLIC PANEL
Anion gap: 7 (ref 5–15)
BUN: 16 mg/dL (ref 8–23)
CO2: 30 mmol/L (ref 22–32)
Calcium: 9.8 mg/dL (ref 8.9–10.3)
Chloride: 101 mmol/L (ref 98–111)
Creatinine, Ser: 1.09 mg/dL (ref 0.61–1.24)
GFR calc Af Amer: >60 mL/min (ref >60)
GFR calc non Af Amer: >60 mL/min (ref >60)
Glucose, Bld: 278 mg/dL — ABNORMAL HIGH (ref 70–99)
Potassium: 4.1 mmol/L (ref 3.5–5.1)
Sodium: 138 mmol/L (ref 135–145)

## 2018-11-22 LAB — CBC WITH DIFFERENTIAL/PLATELET
Abs Immature Granulocytes: 0.01 10*3/uL (ref 0.00–0.07)
Basophils Absolute: 0 10*3/uL (ref 0.0–0.1)
Basophils Relative: 1 %
Eosinophils Absolute: 0.3 10*3/uL (ref 0.0–0.5)
Eosinophils Relative: 6 %
HCT: 43.8 % (ref 39.0–52.0)
Hemoglobin: 13.7 g/dL (ref 13.0–17.0)
Immature Granulocytes: 0 %
Lymphocytes Relative: 27 %
Lymphs Abs: 1.6 10*3/uL (ref 0.7–4.0)
MCH: 25.5 pg — ABNORMAL LOW (ref 26.0–34.0)
MCHC: 31.3 g/dL (ref 30.0–36.0)
MCV: 81.4 fL (ref 80.0–100.0)
Monocytes Absolute: 0.4 10*3/uL (ref 0.1–1.0)
Monocytes Relative: 7 %
Neutro Abs: 3.5 10*3/uL (ref 1.7–7.7)
Neutrophils Relative %: 59 %
Platelets: 161 10*3/uL (ref 150–400)
RBC: 5.38 MIL/uL (ref 4.22–5.81)
RDW: 15.7 % — ABNORMAL HIGH (ref 11.5–15.5)
WBC: 5.9 10*3/uL (ref 4.0–10.5)
nRBC: 0 % (ref 0.0–0.2)

## 2018-11-22 LAB — TYPE AND SCREEN
ABO/RH(D): O POS
Antibody Screen: NEGATIVE

## 2018-11-22 LAB — HEMOGLOBIN A1C
Hgb A1c MFr Bld: 8.5 % — ABNORMAL HIGH (ref 4.8–5.6)
Mean Plasma Glucose: 197.25 mg/dL

## 2018-11-22 LAB — SURGICAL PCR SCREEN
MRSA, PCR: NEGATIVE
Staphylococcus aureus: NEGATIVE

## 2018-11-22 LAB — PROTIME-INR
INR: 0.9 (ref 0.8–1.2)
Prothrombin Time: 12.3 seconds (ref 11.4–15.2)

## 2018-11-22 LAB — GLUCOSE, CAPILLARY: Glucose-Capillary: 254 mg/dL — ABNORMAL HIGH (ref 70–99)

## 2018-11-22 NOTE — Progress Notes (Addendum)
PCP - [employer]  Cardiologist - Denies  Chest x-ray - 11/22/18(Epic)  EKG -11/22/18(Epic)   Stress Test - Denies  ECHO - Denies  Cardiac Cath - Denies  AICD-Denies PM-Denies LOOP-Denies  Sleep Study - Denies CPAP - Denies  LABS-CBC,BMP,PT,A1C,T/S,PCR  ASA-Denies  HA1C 9.4(Jan 2020) Repeat 11/22/18 Fasting Blood Sugar - 103 Checks Blood Sugar _not very regular  Anesthesia- Y Repeat A1 C=8.5  Pt denies having chest pain, sob, or fever at this time. All instructions explained to the pt, with a verbal understanding of the material. Pt agrees to go over the instructions while at home for a better understanding. The opportunity to ask questions was provided.

## 2018-11-22 NOTE — Progress Notes (Signed)

## 2018-11-29 NOTE — Anesthesia Preprocedure Evaluation (Addendum)
Anesthesia Evaluation  Patient identified by MRN, date of birth, ID band Patient awake    Reviewed: Allergy & Precautions  Airway Mallampati: II   Neck ROM: Limited    Dental   Pulmonary neg pulmonary ROS, former smoker,    breath sounds clear to auscultation       Cardiovascular hypertension,  Rhythm:Regular Rate:Normal     Neuro/Psych    GI/Hepatic negative GI ROS, Neg liver ROS,   Endo/Other  diabetes  Renal/GU      Musculoskeletal   Abdominal   Peds  Hematology   Anesthesia Other Findings   Reproductive/Obstetrics                            Anesthesia Physical Anesthesia Plan  ASA: III  Anesthesia Plan: General   Post-op Pain Management:    Induction: Intravenous  PONV Risk Score and Plan: 2 and Ondansetron, Dexamethasone and Midazolam  Airway Management Planned: Oral ETT  Additional Equipment:   Intra-op Plan:   Post-operative Plan: Possible Post-op intubation/ventilation  Informed Consent: I have reviewed the patients History and Physical, chart, labs and discussed the procedure including the risks, benefits and alternatives for the proposed anesthesia with the patient or authorized representative who has indicated his/her understanding and acceptance.     Dental advisory given  Plan Discussed with: Anesthesiologist and CRNA  Anesthesia Plan Comments:        Anesthesia Quick Evaluation

## 2018-11-29 NOTE — Progress Notes (Signed)

## 2018-11-30 ENCOUNTER — Other Ambulatory Visit: Payer: Self-pay

## 2018-11-30 ENCOUNTER — Inpatient Hospital Stay (HOSPITAL_COMMUNITY)
Admission: RE | Admit: 2018-11-30 | Discharge: 2018-12-01 | DRG: 455 | Disposition: A | Discharge status: Home / Self Care | Payer: Medicare Other | Attending: Neurological Surgery | Admitting: Neurological Surgery

## 2018-11-30 ENCOUNTER — Inpatient Hospital Stay (HOSPITAL_COMMUNITY): Payer: Medicare Other

## 2018-11-30 ENCOUNTER — Inpatient Hospital Stay (HOSPITAL_COMMUNITY): Payer: Medicare Other | Admitting: Vascular Surgery

## 2018-11-30 ENCOUNTER — Inpatient Hospital Stay (HOSPITAL_COMMUNITY): Payer: Medicare Other | Admitting: Certified Registered"

## 2018-11-30 ENCOUNTER — Encounter (HOSPITAL_COMMUNITY)
Admission: RE | Disposition: A | Discharge status: Home / Self Care | Payer: Self-pay | Source: Home / Self Care | Attending: Neurological Surgery

## 2018-11-30 ENCOUNTER — Encounter (HOSPITAL_COMMUNITY): Payer: Self-pay

## 2018-11-30 DIAGNOSIS — M419 Scoliosis, unspecified: Secondary | ICD-10-CM | POA: Diagnosis not present

## 2018-11-30 DIAGNOSIS — G9619 Other disorders of meninges, not elsewhere classified: Secondary | ICD-10-CM | POA: Diagnosis present

## 2018-11-30 DIAGNOSIS — Z87891 Personal history of nicotine dependence: Secondary | ICD-10-CM

## 2018-11-30 DIAGNOSIS — I10 Essential (primary) hypertension: Secondary | ICD-10-CM | POA: Diagnosis present

## 2018-11-30 DIAGNOSIS — E119 Type 2 diabetes mellitus without complications: Secondary | ICD-10-CM | POA: Diagnosis present

## 2018-11-30 DIAGNOSIS — Z981 Arthrodesis status: Secondary | ICD-10-CM

## 2018-11-30 DIAGNOSIS — M47816 Spondylosis without myelopathy or radiculopathy, lumbar region: Secondary | ICD-10-CM | POA: Diagnosis present

## 2018-11-30 DIAGNOSIS — M4326 Fusion of spine, lumbar region: Secondary | ICD-10-CM | POA: Diagnosis not present

## 2018-11-30 DIAGNOSIS — M48061 Spinal stenosis, lumbar region without neurogenic claudication: Secondary | ICD-10-CM | POA: Diagnosis present

## 2018-11-30 DIAGNOSIS — Z419 Encounter for procedure for purposes other than remedying health state, unspecified: Secondary | ICD-10-CM

## 2018-11-30 DIAGNOSIS — E785 Hyperlipidemia, unspecified: Secondary | ICD-10-CM | POA: Diagnosis not present

## 2018-11-30 DIAGNOSIS — M4316 Spondylolisthesis, lumbar region: Secondary | ICD-10-CM | POA: Diagnosis not present

## 2018-11-30 LAB — GLUCOSE, CAPILLARY
Glucose-Capillary: 140 mg/dL — ABNORMAL HIGH (ref 70–99)
Glucose-Capillary: 208 mg/dL — ABNORMAL HIGH (ref 70–99)
Glucose-Capillary: 198 mg/dL — ABNORMAL HIGH (ref 70–99)
Glucose-Capillary: 237 mg/dL — ABNORMAL HIGH (ref 70–99)

## 2018-11-30 SURGERY — POSTERIOR LUMBAR FUSION 2 LEVEL
Anesthesia: General | Site: Back

## 2018-11-30 MED ORDER — SENNA 8.6 MG PO TABS
1.0000 | ORAL_TABLET | Freq: Two times a day (BID) | ORAL | Status: DC
  Administered 2018-11-30 – 2018-12-01 (×3): 8.6 mg via ORAL
  Filled 2018-11-30 (×3): qty 1

## 2018-11-30 MED ORDER — MENTHOL 3 MG MT LOZG: 1.0000 | LOZENGE | OROMUCOSAL | Status: DC | PRN

## 2018-11-30 MED ORDER — FENTANYL CITRATE (PF) 250 MCG/5ML IJ SOLN
INTRAMUSCULAR | Status: AC
  Filled 2018-11-30: qty 5

## 2018-11-30 MED ORDER — METFORMIN HCL 500 MG PO TABS
1000.0000 mg | ORAL_TABLET | Freq: Two times a day (BID) | ORAL | Status: DC
  Administered 2018-11-30 – 2018-12-01 (×2): 1000 mg via ORAL
  Filled 2018-11-30 (×2): qty 2

## 2018-11-30 MED ORDER — ACETAMINOPHEN 10 MG/ML IV SOLN
INTRAVENOUS | Status: AC
  Filled 2018-11-30: qty 100

## 2018-11-30 MED ORDER — ONDANSETRON HCL 4 MG/2ML IJ SOLN: 4.0000 mg | Freq: Four times a day (QID) | INTRAMUSCULAR | Status: DC | PRN

## 2018-11-30 MED ORDER — DEXAMETHASONE SODIUM PHOSPHATE 10 MG/ML IJ SOLN
10.0000 mg | INTRAMUSCULAR | Status: AC
  Administered 2018-11-30: 08:00:00 10 mg via INTRAVENOUS
  Filled 2018-11-30: qty 1

## 2018-11-30 MED ORDER — ALBUMIN HUMAN 5 % IV SOLN
INTRAVENOUS | Status: DC | PRN
  Administered 2018-11-30 (×2): via INTRAVENOUS

## 2018-11-30 MED ORDER — POTASSIUM CHLORIDE IN NACL 20-0.9 MEQ/L-% IV SOLN
INTRAVENOUS | Status: DC
  Administered 2018-11-30 – 2018-12-01 (×2): via INTRAVENOUS
  Filled 2018-11-30 (×2): qty 1000

## 2018-11-30 MED ORDER — ROCURONIUM BROMIDE 50 MG/5ML IV SOSY
PREFILLED_SYRINGE | INTRAVENOUS | Status: DC | PRN
  Administered 2018-11-30: 50 mg via INTRAVENOUS

## 2018-11-30 MED ORDER — THROMBIN 20000 UNITS EX SOLR
CUTANEOUS | Status: AC
  Filled 2018-11-30: qty 20000

## 2018-11-30 MED ORDER — METHOCARBAMOL 1000 MG/10ML IJ SOLN
500.0000 mg | Freq: Four times a day (QID) | INTRAVENOUS | Status: DC | PRN
  Filled 2018-11-30: qty 5

## 2018-11-30 MED ORDER — KETOROLAC TROMETHAMINE 30 MG/ML IJ SOLN
INTRAMUSCULAR | Status: DC | PRN
  Administered 2018-11-30: 15 mg via INTRAVENOUS

## 2018-11-30 MED ORDER — ACETAMINOPHEN 650 MG RE SUPP: 650.0000 mg | RECTAL | Status: DC | PRN

## 2018-11-30 MED ORDER — FENTANYL CITRATE (PF) 100 MCG/2ML IJ SOLN
INTRAMUSCULAR | Status: DC | PRN
  Administered 2018-11-30 (×7): 50 ug via INTRAVENOUS
  Administered 2018-11-30: 100 ug via INTRAVENOUS
  Administered 2018-11-30: 50 ug via INTRAVENOUS

## 2018-11-30 MED ORDER — SODIUM CHLORIDE 0.9 % IV SOLN
INTRAVENOUS | Status: DC | PRN
  Administered 2018-11-30: 07:00:00

## 2018-11-30 MED ORDER — SODIUM CHLORIDE 0.9% FLUSH: 3.0000 mL | INTRAVENOUS | Status: DC | PRN

## 2018-11-30 MED ORDER — SODIUM CHLORIDE 0.9 % IV SOLN
INTRAVENOUS | Status: DC | PRN
  Administered 2018-11-30: 09:00:00 50 ug/min via INTRAVENOUS

## 2018-11-30 MED ORDER — OXYCODONE HCL 5 MG PO TABS
10.0000 mg | ORAL_TABLET | ORAL | Status: DC | PRN
  Administered 2018-11-30 – 2018-12-01 (×4): 10 mg via ORAL
  Filled 2018-11-30 (×4): qty 2

## 2018-11-30 MED ORDER — ACETAMINOPHEN 325 MG PO TABS: 650.0000 mg | ORAL_TABLET | ORAL | Status: DC | PRN

## 2018-11-30 MED ORDER — ATORVASTATIN CALCIUM 40 MG PO TABS
40.0000 mg | ORAL_TABLET | Freq: Every day | ORAL | Status: DC
  Administered 2018-11-30: 40 mg via ORAL
  Filled 2018-11-30: qty 1

## 2018-11-30 MED ORDER — SODIUM CHLORIDE 0.9% FLUSH: 3.0000 mL | Freq: Two times a day (BID) | INTRAVENOUS | Status: DC

## 2018-11-30 MED ORDER — METHOCARBAMOL 500 MG PO TABS
500.0000 mg | ORAL_TABLET | Freq: Four times a day (QID) | ORAL | Status: DC | PRN
  Administered 2018-12-01: 500 mg via ORAL
  Filled 2018-11-30: qty 1

## 2018-11-30 MED ORDER — ACETAMINOPHEN 10 MG/ML IV SOLN
INTRAVENOUS | Status: DC | PRN
  Administered 2018-11-30: 1000 mg via INTRAVENOUS

## 2018-11-30 MED ORDER — SODIUM CHLORIDE 0.9 % IV SOLN: 250.0000 mL | INTRAVENOUS | Status: DC

## 2018-11-30 MED ORDER — LACTATED RINGERS IV SOLN
INTRAVENOUS | Status: DC | PRN
  Administered 2018-11-30 (×3): via INTRAVENOUS

## 2018-11-30 MED ORDER — TAB-A-VITE/IRON PO TABS
ORAL_TABLET | Freq: Every day | ORAL | Status: DC
  Administered 2018-11-30 – 2018-12-01 (×2): 1 via ORAL
  Filled 2018-11-30 (×2): qty 1

## 2018-11-30 MED ORDER — PHENYLEPHRINE 40 MCG/ML (10ML) SYRINGE FOR IV PUSH (FOR BLOOD PRESSURE SUPPORT)
PREFILLED_SYRINGE | INTRAVENOUS | Status: DC | PRN
  Administered 2018-11-30: 120 ug via INTRAVENOUS
  Administered 2018-11-30 (×3): 80 ug via INTRAVENOUS

## 2018-11-30 MED ORDER — VITAMIN C 500 MG PO TABS
1000.0000 mg | ORAL_TABLET | Freq: Every day | ORAL | Status: DC
  Administered 2018-11-30: 15:00:00 1000 mg via ORAL
  Filled 2018-11-30: qty 2

## 2018-11-30 MED ORDER — PHENYLEPHRINE 40 MCG/ML (10ML) SYRINGE FOR IV PUSH (FOR BLOOD PRESSURE SUPPORT)
PREFILLED_SYRINGE | INTRAVENOUS | Status: AC
  Filled 2018-11-30: qty 10

## 2018-11-30 MED ORDER — MIDAZOLAM HCL 2 MG/2ML IJ SOLN
INTRAMUSCULAR | Status: AC
  Filled 2018-11-30: qty 2

## 2018-11-30 MED ORDER — HYDROMORPHONE HCL 1 MG/ML IJ SOLN
0.5000 mg | INTRAMUSCULAR | Status: DC | PRN
  Administered 2018-11-30 – 2018-12-01 (×2): 0.5 mg via INTRAVENOUS
  Filled 2018-11-30 (×2): qty 1

## 2018-11-30 MED ORDER — 0.9 % SODIUM CHLORIDE (POUR BTL) OPTIME
TOPICAL | Status: DC | PRN
  Administered 2018-11-30: 1000 mL

## 2018-11-30 MED ORDER — SUGAMMADEX SODIUM 200 MG/2ML IV SOLN
INTRAVENOUS | Status: DC | PRN
  Administered 2018-11-30: 160 mg via INTRAVENOUS

## 2018-11-30 MED ORDER — CELECOXIB 200 MG PO CAPS
200.0000 mg | ORAL_CAPSULE | Freq: Two times a day (BID) | ORAL | Status: DC
  Administered 2018-11-30 – 2018-12-01 (×3): 200 mg via ORAL
  Filled 2018-11-30 (×3): qty 1

## 2018-11-30 MED ORDER — THROMBIN 5000 UNITS EX SOLR
OROMUCOSAL | Status: DC | PRN
  Administered 2018-11-30: 07:00:00 via TOPICAL

## 2018-11-30 MED ORDER — THROMBIN 20000 UNITS EX SOLR
CUTANEOUS | Status: DC | PRN
  Administered 2018-11-30: 07:00:00 via TOPICAL

## 2018-11-30 MED ORDER — SUCCINYLCHOLINE CHLORIDE 20 MG/ML IJ SOLN
INTRAMUSCULAR | Status: DC | PRN
  Administered 2018-11-30: 140 mg via INTRAVENOUS

## 2018-11-30 MED ORDER — CHLORHEXIDINE GLUCONATE CLOTH 2 % EX PADS: 6.0000 | MEDICATED_PAD | Freq: Once | CUTANEOUS | Status: DC

## 2018-11-30 MED ORDER — THROMBIN 5000 UNITS EX SOLR
CUTANEOUS | Status: AC
  Filled 2018-11-30: qty 5000

## 2018-11-30 MED ORDER — VANCOMYCIN HCL 1000 MG IV SOLR
INTRAVENOUS | Status: AC
  Filled 2018-11-30: qty 1000

## 2018-11-30 MED ORDER — THROMBIN 20000 UNITS EX KIT
PACK | CUTANEOUS | Status: AC
  Filled 2018-11-30: qty 1

## 2018-11-30 MED ORDER — PROPOFOL 10 MG/ML IV BOLUS
INTRAVENOUS | Status: DC | PRN
  Administered 2018-11-30: 150 mg via INTRAVENOUS

## 2018-11-30 MED ORDER — CEFAZOLIN SODIUM-DEXTROSE 2-4 GM/100ML-% IV SOLN
2.0000 g | INTRAVENOUS | Status: AC
  Administered 2018-11-30: 08:00:00 2 g via INTRAVENOUS
  Filled 2018-11-30: qty 100

## 2018-11-30 MED ORDER — CEFAZOLIN SODIUM-DEXTROSE 2-4 GM/100ML-% IV SOLN
2.0000 g | Freq: Three times a day (TID) | INTRAVENOUS | Status: AC
  Administered 2018-11-30 – 2018-12-01 (×2): 2 g via INTRAVENOUS
  Filled 2018-11-30 (×2): qty 100

## 2018-11-30 MED ORDER — INSULIN ASPART 100 UNIT/ML ~~LOC~~ SOLN
0.0000 [IU] | Freq: Three times a day (TID) | SUBCUTANEOUS | Status: DC
  Administered 2018-11-30: 18:00:00 3 [IU] via SUBCUTANEOUS

## 2018-11-30 MED ORDER — BUPIVACAINE HCL (PF) 0.25 % IJ SOLN
INTRAMUSCULAR | Status: DC | PRN
  Administered 2018-11-30: 4 mL
  Administered 2018-11-30: 10 mL

## 2018-11-30 MED ORDER — ROCURONIUM BROMIDE 50 MG/5ML IV SOSY
PREFILLED_SYRINGE | INTRAVENOUS | Status: AC
  Filled 2018-11-30: qty 5

## 2018-11-30 MED ORDER — QUINAPRIL HCL 10 MG PO TABS
40.0000 mg | ORAL_TABLET | Freq: Every day | ORAL | Status: DC
  Administered 2018-11-30: 18:00:00 40 mg via ORAL
  Filled 2018-11-30 (×2): qty 4

## 2018-11-30 MED ORDER — PROPOFOL 10 MG/ML IV BOLUS
INTRAVENOUS | Status: AC
  Filled 2018-11-30: qty 20

## 2018-11-30 MED ORDER — VANCOMYCIN HCL 1000 MG IV SOLR
INTRAVENOUS | Status: DC | PRN
  Administered 2018-11-30: 1000 mg

## 2018-11-30 MED ORDER — ONDANSETRON HCL 4 MG/2ML IJ SOLN
INTRAMUSCULAR | Status: AC
  Filled 2018-11-30: qty 2

## 2018-11-30 MED ORDER — ONDANSETRON HCL 4 MG/2ML IJ SOLN
INTRAMUSCULAR | Status: DC | PRN
  Administered 2018-11-30: 4 mg via INTRAVENOUS

## 2018-11-30 MED ORDER — PHENOL 1.4 % MT LIQD: 1.0000 | OROMUCOSAL | Status: DC | PRN

## 2018-11-30 MED ORDER — LIDOCAINE 2% (20 MG/ML) 5 ML SYRINGE
INTRAMUSCULAR | Status: DC | PRN
  Administered 2018-11-30: 80 mg via INTRAVENOUS

## 2018-11-30 MED ORDER — LIDOCAINE 2% (20 MG/ML) 5 ML SYRINGE
INTRAMUSCULAR | Status: AC
  Filled 2018-11-30: qty 5

## 2018-11-30 MED ORDER — BUPIVACAINE HCL (PF) 0.25 % IJ SOLN
INTRAMUSCULAR | Status: AC
  Filled 2018-11-30: qty 30

## 2018-11-30 MED ORDER — SUCCINYLCHOLINE CHLORIDE 200 MG/10ML IV SOSY
PREFILLED_SYRINGE | INTRAVENOUS | Status: AC
  Filled 2018-11-30: qty 10

## 2018-11-30 MED ORDER — MIDAZOLAM HCL 5 MG/5ML IJ SOLN
INTRAMUSCULAR | Status: DC | PRN
  Administered 2018-11-30: 1 mg via INTRAVENOUS

## 2018-11-30 MED ORDER — ONDANSETRON HCL 4 MG PO TABS: 4.0000 mg | ORAL_TABLET | Freq: Four times a day (QID) | ORAL | Status: DC | PRN

## 2018-11-30 MED ORDER — FENTANYL CITRATE (PF) 100 MCG/2ML IJ SOLN: 25.0000 ug | INTRAMUSCULAR | Status: DC | PRN

## 2018-11-30 SURGICAL SUPPLY — 77 items
BAG DECANTER FOR FLEXI CONT (MISCELLANEOUS) ×4 IMPLANT
BASKET BONE COLLECTION (BASKET) ×4 IMPLANT
BENZOIN TINCTURE PRP APPL 2/3 (GAUZE/BANDAGES/DRESSINGS) ×4 IMPLANT
BLADE CLIPPER SURG (BLADE) ×4 IMPLANT
BONE CANC CHIPS 40CC CAN1/2 (Bone Implant) ×4 IMPLANT
BUR MATCHSTICK NEURO 3.0 LAGG (BURR) ×4 IMPLANT
CAGE COROENT 12X9X23-8 (Cage) ×4 IMPLANT
CAGE COROENT PLIF 10X28-8 LUMB (Cage) ×4 IMPLANT
CAGE MAS PLIF 9X9X23-8 LUMBAR (Cage) ×4 IMPLANT
CANISTER SUCT 3000ML PPV (MISCELLANEOUS) ×4 IMPLANT
CAP RELINE MOD TULIP RMM (Cap) ×8 IMPLANT
CARTRIDGE OIL MAESTRO DRILL (MISCELLANEOUS) ×2 IMPLANT
CHIPS CANC BONE 40CC CAN1/2 (Bone Implant) ×2 IMPLANT
CLOSURE WOUND 1/2 X4 (GAUZE/BANDAGES/DRESSINGS) ×1
CONT SPEC 4OZ CLIKSEAL STRL BL (MISCELLANEOUS) ×4 IMPLANT
COVER BACK TABLE 60X90IN (DRAPES) ×4 IMPLANT
COVER WAND RF STERILE (DRAPES) ×4 IMPLANT
DERMABOND ADVANCED (GAUZE/BANDAGES/DRESSINGS) ×2
DERMABOND ADVANCED .7 DNX12 (GAUZE/BANDAGES/DRESSINGS) ×2 IMPLANT
DIFFUSER DRILL AIR PNEUMATIC (MISCELLANEOUS) ×4 IMPLANT
DRAPE C-ARM 42X72 X-RAY (DRAPES) ×4 IMPLANT
DRAPE C-ARMOR (DRAPES) ×4 IMPLANT
DRAPE LAPAROTOMY 100X72X124 (DRAPES) ×4 IMPLANT
DRAPE POUCH INSTRU U-SHP 10X18 (DRAPES) ×4 IMPLANT
DRAPE SURG 17X23 STRL (DRAPES) ×4 IMPLANT
DRSG OPSITE POSTOP 4X6 (GAUZE/BANDAGES/DRESSINGS) ×4 IMPLANT
DURAPREP 26ML APPLICATOR (WOUND CARE) ×4 IMPLANT
ELECT REM PT RETURN 9FT ADLT (ELECTROSURGICAL) ×4
ELECTRODE REM PT RTRN 9FT ADLT (ELECTROSURGICAL) ×2 IMPLANT
EVACUATOR 1/8 PVC DRAIN (DRAIN) ×4 IMPLANT
GAUZE 4X4 16PLY RFD (DISPOSABLE) IMPLANT
GLOVE BIO SURGEON STRL SZ 6 (GLOVE) ×12 IMPLANT
GLOVE BIO SURGEON STRL SZ7 (GLOVE) ×8 IMPLANT
GLOVE BIO SURGEON STRL SZ8 (GLOVE) ×12 IMPLANT
GLOVE BIOGEL PI IND STRL 6.5 (GLOVE) ×2 IMPLANT
GLOVE BIOGEL PI IND STRL 7.0 (GLOVE) ×2 IMPLANT
GLOVE BIOGEL PI IND STRL 8 (GLOVE) ×4 IMPLANT
GLOVE BIOGEL PI IND STRL 8.5 (GLOVE) ×2 IMPLANT
GLOVE BIOGEL PI INDICATOR 6.5 (GLOVE) ×2
GLOVE BIOGEL PI INDICATOR 7.0 (GLOVE) ×2
GLOVE BIOGEL PI INDICATOR 8 (GLOVE) ×4
GLOVE BIOGEL PI INDICATOR 8.5 (GLOVE) ×2
GLOVE SURG SS PI 6.0 STRL IVOR (GLOVE) ×4 IMPLANT
GLOVE SURG SS PI 6.5 STRL IVOR (GLOVE) ×4 IMPLANT
GOWN STRL REUS W/ TWL LRG LVL3 (GOWN DISPOSABLE) ×8 IMPLANT
GOWN STRL REUS W/ TWL XL LVL3 (GOWN DISPOSABLE) ×4 IMPLANT
GOWN STRL REUS W/TWL 2XL LVL3 (GOWN DISPOSABLE) IMPLANT
GOWN STRL REUS W/TWL LRG LVL3 (GOWN DISPOSABLE) ×8
GOWN STRL REUS W/TWL XL LVL3 (GOWN DISPOSABLE) ×4
HEMOSTAT POWDER KIT SURGIFOAM (HEMOSTASIS) ×4 IMPLANT
KIT BASIN OR (CUSTOM PROCEDURE TRAY) ×4 IMPLANT
KIT TURNOVER KIT B (KITS) ×4 IMPLANT
MILL MEDIUM DISP (BLADE) ×4 IMPLANT
NEEDLE HYPO 25X1 1.5 SAFETY (NEEDLE) ×4 IMPLANT
NS IRRIG 1000ML POUR BTL (IV SOLUTION) ×4 IMPLANT
OIL CARTRIDGE MAESTRO DRILL (MISCELLANEOUS) ×4
PACK LAMINECTOMY NEURO (CUSTOM PROCEDURE TRAY) ×4 IMPLANT
PAD ARMBOARD 7.5X6 YLW CONV (MISCELLANEOUS) ×12 IMPLANT
PATTIES SURGICAL 1X1 (DISPOSABLE) ×4 IMPLANT
ROD RELINE COCR LORD 5X40MM (Rod) ×4 IMPLANT
ROD RELINE O COCR 5.0X50MM (Rod) ×4 IMPLANT
SCREW LOCK RSS 4.5/5.0MM (Screw) ×16 IMPLANT
SCREW SHANK RELINE MOD 5.5X35 (Screw) ×4 IMPLANT
SHANK RELINE MOD 5.5X40 (Screw) ×4 IMPLANT
SPONGE LAP 4X18 RFD (DISPOSABLE) IMPLANT
SPONGE SURGIFOAM ABS GEL 100 (HEMOSTASIS) ×4 IMPLANT
STRIP CLOSURE SKIN 1/2X4 (GAUZE/BANDAGES/DRESSINGS) ×3 IMPLANT
SUT PROLENE 6 0 BV (SUTURE) ×8 IMPLANT
SUT VIC AB 0 CT1 18XCR BRD8 (SUTURE) ×2 IMPLANT
SUT VIC AB 0 CT1 8-18 (SUTURE) ×2
SUT VIC AB 2-0 CP2 18 (SUTURE) ×4 IMPLANT
SUT VIC AB 3-0 SH 8-18 (SUTURE) ×8 IMPLANT
SYR CONTROL 10ML LL (SYRINGE) ×4 IMPLANT
TOWEL GREEN STERILE (TOWEL DISPOSABLE) ×4 IMPLANT
TOWEL GREEN STERILE FF (TOWEL DISPOSABLE) ×4 IMPLANT
TRAY FOLEY MTR SLVR 16FR STAT (SET/KITS/TRAYS/PACK) ×4 IMPLANT
WATER STERILE IRR 1000ML POUR (IV SOLUTION) ×4 IMPLANT

## 2018-11-30 NOTE — Anesthesia Postprocedure Evaluation (Signed)
Anesthesia Post Note  Patient: Scott Parks  Procedure(s) Performed: Posterior lumber interbody fusion lumbar three-four extension of hardware - Posterior Lateral and Interbody fusion (N/A Back) Posterior lumber interbody fusion lumbar three-four extension of hardware - Posterior Lateral and Interbody fusion (Back)     Patient location during evaluation: PACU Anesthesia Type: General Level of consciousness: awake Pain management: pain level controlled Vital Signs Assessment: post-procedure vital signs reviewed and stable Respiratory status: spontaneous breathing Cardiovascular status: stable Postop Assessment: no apparent nausea or vomiting Anesthetic complications: no    Last Vitals:  Vitals:   11/30/18 1332 11/30/18 1347  BP: 117/74 114/72  Pulse: 92 90  Resp: 15 12  Temp:  36.4 C  SpO2: 94% 95%    Last Pain:  Vitals:   11/30/18 1332  TempSrc:   PainSc: Asleep                 Karima Carrell

## 2018-11-30 NOTE — H&P (Signed)
Subjective: Patient is a 78 y.o. male admitted for PLIF. Onset of symptoms was several months ago, gradually worsening since that time.  The pain is rated severe, and is located at the across the lower back and radiates to RLE. The pain is described as aching and occurs all day. The symptoms have been progressive. Symptoms are exacerbated by exercise. MRI or CT showed adjacent level dz   Past Medical History:  Diagnosis Date  . Arthritis   . Diabetes mellitus without complication ([place])    Type II  . Hx of adenomatous polyp of colon 11/29/2017  . Hyperlipidemia   . Hypertension     Past Surgical History:  Procedure Laterality Date  . COLONOSCOPY    . HAND SURGERY Right   . KNEE ARTHROSCOPY Left   . LUMBAR LAMINECTOMY/DECOMPRESSION MICRODISCECTOMY Right 04/07/2016   Procedure: hemilaminectomy  - Lumbar tow-three - Lumbar three-four - right;  Surgeon: Eustace Moore, MD;  Location: [facility];  Service: Neurosurgery;  Laterality: Right;  . MAXIMUM ACCESS (MAS)POSTERIOR LUMBAR INTERBODY FUSION (PLIF) 1 LEVEL N/A 04/07/2016   Procedure: lumbar two-three, three-four, four-five laminectomy lumbar four-five fusion with pedicle screws (PLIF without interbodies);  Surgeon: Eustace Moore, MD;  Location: [facility];  Service: Neurosurgery;  Laterality: N/A;  . TRIGGER FINGER RELEASE Left    ring finger    Prior to Admission medications   Medication Sig Start Date End Date Taking? Authorizing Provider  atorvastatin (LIPITOR) 40 MG tablet Take 1 tablet (40 mg total) by mouth daily. 08/07/18  Yes Tysinger, Camelia Eng, PA-C  metFORMIN (GLUCOPHAGE) 1000 MG tablet TAKE 1 TABLET TWICE A DAY WITH MEALS Patient taking differently: Take 1,000 mg by mouth 2 (two) times daily with a meal.  08/13/18  Yes Denita Lung, MD  Pediatric Multivitamins-Iron (MULTI-DELYN/IRON PO) Take 2 tablets by mouth daily.    Yes [provider]  quinapril (ACCUPRIL) 40 MG tablet Take 1 tablet (40 mg total) by mouth  daily. 08/07/18  Yes Tysinger, Camelia Eng, PA-C  vitamin C (ASCORBIC ACID) 500 MG tablet Take 1,000 mg by mouth daily.   Yes [provider]  glucose blood (ONETOUCH VERIO) test strip Use as instructed 08/22/17   Denita Lung, MD  glucose blood test strip Needs Test strips and lancets for a ONE TOUCH VERIO IQ MACHINE 11/06/13   Tysinger, Camelia Eng, PA-C   No Known Allergies  Social History   Tobacco Use  . Smoking status: Former Smoker    Types: Cigarettes    Last attempt to quit: 08/15/1984    Years since quitting: 34.3  . Smokeless tobacco: Never Used  Substance Use Topics  . Alcohol use: Yes    Comment: occasionally    Family History  Problem Relation Age of Onset  . Colon polyps Neg Hx   . Colon cancer Neg Hx   . Esophageal cancer Neg Hx   . Rectal cancer Neg Hx   . Stomach cancer Neg Hx      Review of Systems  Positive ROS: neg  All other systems have been reviewed and were otherwise negative with the exception of those mentioned in the HPI and as above.  Objective: Vital signs in last 24 hours: Temp:  [98.3 F (36.8 C)] 98.3 F (36.8 C) (04/17 0536) Pulse Rate:  [71] 71 (04/17 0536) Resp:  [18] 18 (04/17 0536) BP: (165-195)/(86-94) 165/86 (04/17 0609) SpO2:  [99 %] 99 % (04/17 0536) Weight:  [77.3 kg] 77.3 kg (  04/17 0541)  General Appearance: Alert, cooperative, no distress, appears stated age Head: Normocephalic, without obvious abnormality, atraumatic Eyes: PERRL, conjunctiva/corneas clear, EOM's intact    Neck: Supple, symmetrical, trachea midline Back: Symmetric, no curvature, ROM normal, no CVA tenderness Lungs:  respirations unlabored Heart: Regular rate and rhythm Abdomen: Soft, non-tender Extremities: Extremities normal, atraumatic, no cyanosis or edema Pulses: 2+ and symmetric all extremities Skin: Skin color, texture, turgor normal, no rashes or lesions  NEUROLOGIC:   Mental status: Alert and oriented x4,  no aphasia, good attention span,  fund of knowledge, and memory Motor Exam - grossly normal Sensory Exam - grossly normal Reflexes: 1= Coordination - grossly normal Gait - grossly normal Balance - grossly normal Cranial Nerves: I: smell Not tested  II: visual acuity  OS: nl    OD: nl  II: visual fields Full to confrontation  II: pupils Equal, round, reactive to light  III,VII: ptosis None  III,IV,VI: extraocular muscles  Full ROM  V: mastication Normal  V: facial light touch sensation  Normal  V,VII: corneal reflex  Present  VII: facial muscle function - upper  Normal  VII: facial muscle function - lower Normal  VIII: hearing Not tested  IX: soft palate elevation  Normal  IX,X: gag reflex Present  XI: trapezius strength  5/5  XI: sternocleidomastoid strength 5/5  XI: neck flexion strength  5/5  XII: tongue strength  Normal    Data Review Lab Results  Component Value Date   WBC 5.9 11/22/2018   HGB 13.7 11/22/2018   HCT 43.8 11/22/2018   MCV 81.4 11/22/2018   PLT 161 11/22/2018   Lab Results  Component Value Date   NA 138 11/22/2018   K 4.1 11/22/2018   CL 101 11/22/2018   CO2 30 11/22/2018   BUN 16 11/22/2018   CREATININE 1.09 11/22/2018   GLUCOSE 278 (H) 11/22/2018   Lab Results  Component Value Date   INR 0.9 11/22/2018    Assessment/Plan:  Estimated body mass index is 25.16 kg/m as calculated from the following:   Height as of this encounter: 5\' 9"  (1.753 m).   Weight as of this encounter: 77.3 kg. Patient admitted for PLIF L2-3 L3-4. Patient has failed a reasonable attempt at conservative therapy.  I explained the condition and procedure to the patient and answered any questions.  Patient wishes to proceed with procedure as planned. Understands risks/ benefits and typical outcomes of procedure.   Eustace Moore 11/30/2018 7:27 AM

## 2018-11-30 NOTE — Op Note (Signed)
11/30/2018  12:56 PM  PATIENT:  Scott Parks  78 y.o. male  PRE-OPERATIVE DIAGNOSIS: Adjacent level spondylosis with stenosis L3-4 with back and right leg pain, scoliosis with deformity, severe foraminal stenosis L3-4 on the right, epidural fibrosis  POST-OPERATIVE DIAGNOSIS:  same  PROCEDURE:   1. Decompressive lumbar laminectomy L3-4 requiring more work than would be required for a simple exposure of the disk for PLIF in order to adequately decompress the neural elements and address the spinal stenosis 2. Posterior lumbar interbody fusion L3-4 using peek interbody cages packed with morcellized allograft and autograft  3. Posterior fixation L3-4 using NuVasive cortical pedicle screws, removal of nonsegmental fixation L4-5.  4. Intertransverse arthrodesis L3-4 right using morcellized autograft and allograft.  SURGEON:  Sherley Bounds, MD  ASSISTANTS: Dr. Vertell Limber  ANESTHESIA:  General  EBL: 750 ml  Total I/O In: 2500 [I.V.:2000; IV Piggyback:500] Out: 0258 [Urine:385; Blood:750]  BLOOD ADMINISTERED:none  DRAINS: none   INDICATION FOR PROCEDURE: This patient presented with severe right leg pain. Imaging revealed severe adjacent level stenosis with severe foraminal stenosis and significant progressive spondylosis L3-4. The patient tried a reasonable attempt at conservative medical measures without relief. I recommended decompression and instrumented fusion to address the stenosis as well as the segmental  instability.  Regional plan was to include L2-3 because of his deformity but upon AP fluoroscopy when planning our incision we saw that the right L2 pedicle measured about 3 mm.  Because of this we did not feel we could get good fixation and therefore decided to only do L3-4.  Patient understood the risks, benefits, and alternatives and potential outcomes and wished to proceed.  PROCEDURE DETAILS:  The patient was brought to the operating room. After induction of generalized endotracheal  anesthesia the patient was rolled into the prone position on chest rolls and all pressure points were padded. The patient's lumbar region was cleaned and then prepped with DuraPrep and draped in the usual sterile fashion. Anesthesia was injected and then a dorsal midline incision was made and carried down to the lumbosacral fascia. The fascia was opened and the paraspinous musculature was taken down in a subperiosteal fashion to expose L3-4 and the previously placed hardware. A self-retaining retractor was placed. Intraoperative fluoroscopy confirmed my level, and I started with placement of the L3 cortical pedicle screws. The pedicle screw entry zones were identified utilizing surface landmarks and  AP and lateral fluoroscopy. I scored the cortex with the high-speed drill and then used the hand drill to drill an upward and outward direction into the pedicle. I then tapped line to line. I then placed a 8.5 x 40 mm cortical pedicle screw into the pedicles of L3 bilaterally.  The locking caps were removed from the L4-5 hardware and the rods were removed.  We then remove the L5 pedicle screws.  The L4 pedicle screws had good purchase.  I then turned my attention to the decompression and complete lumbar laminectomies, hemi- facetectomies, and foraminotomies were performed at L3-4.  Significant epidural fibrosis at L3-4 on the right from the previous decompressive hemilaminectomy.  I small unintended durotomy was created which was repaired with two 6-0 Prolene sutures.  Later check Valsalva to make sure there was no continued leakage from the repair site.  The decompression was extremely difficult because of the patient's scoliotic deformity, his severe foraminal stenosis and his previous decompressive hemilaminectomy.  The scar was quite thick.  We spent considerable time trying to dissect through the scar tissue and identified  normal anatomy.  The patient had significant spinal stenosis and this required more work than  would be required for a simple exposure of the disc for posterior lumbar interbody fusion which would only require a limited laminotomy. Much more generous decompression and generous foraminotomy was undertaken in order to adequately decompress the neural elements and address the patient's leg pain. The yellow ligament was removed to expose the underlying dura and nerve roots, and generous foraminotomies were performed to adequately decompress the neural elements. Both the exiting and traversing nerve roots were decompressed on both sides until a coronary dilator passed easily along the nerve roots. Once the decompression was complete, I turned my attention to the posterior lower lumbar interbody fusion. The epidural venous vasculature was coagulated and cut sharply. Disc space was incised and the initial discectomy was performed with pituitary rongeurs. The disc space was distracted with sequential distractors to a height of 12 mm on the left and 10 mm on the right. We then used a series of scrapers and shavers to prepare the endplates for fusion. The midline was prepared with Epstein curettes. Once the complete discectomy was finished, we packed an appropriate sized interbody cage with local autograft and morcellized allograft, gently retracted the nerve root, and tapped the cage into position at L3-4.  The midline between the cages was packed with morselized autograft and allograft.  We then decorticated the transverse processes and laid a mixture of morcellized autograft and allograft out over these to perform intertransverse arthrodesis at L3-4 on the right. We then placed lordotic rods into the multiaxial screw heads of the pedicle screws and locked these in position with the locking caps and anti-torque device. We then checked our construct with AP and lateral fluoroscopy. Irrigated with copious amounts of bacitracin-containing saline solution. Inspected the nerve roots once again to assure adequate  decompression, lined to the dura with Gelfoam, placed powdered vancomycin into the wound, and closed the muscle and the fascia with 0 Vicryl. Closed the subcutaneous tissues with 2-0 Vicryl and subcuticular tissues with 3-0 Vicryl. The skin was closed with benzoin and Steri-Strips. Dressing was then applied, the patient was awakened from general anesthesia and transported to the recovery room in stable condition. At the end of the procedure all sponge, needle and instrument counts were correct.   PLAN OF CARE: admit to inpatient  PATIENT DISPOSITION:  PACU - hemodynamically stable.   Delay start of Pharmacological VTE agent (>24hrs) due to surgical blood loss or risk of bleeding:  yes

## 2018-11-30 NOTE — Transfer of Care (Signed)
Immediate Anesthesia Transfer of Care Note  Patient: Scott Parks  Procedure(s) Performed: Posterior lumber interbody fusion lumbar three-four extension of hardware - Posterior Lateral and Interbody fusion (N/A Back) Posterior lumber interbody fusion lumbar three-four extension of hardware - Posterior Lateral and Interbody fusion (Back)  Patient Location: PACU  Anesthesia Type:General  Level of Consciousness: drowsy and patient cooperative  Airway & Oxygen Therapy: Patient Spontanous Breathing and Patient connected to face mask oxygen  Post-op Assessment: Report given to RN and Post -op Vital signs reviewed and stable  Post vital signs: Reviewed and stable  Last Vitals:  Vitals Value Taken Time  BP 146/89 11/30/2018  1:02 PM  Temp    Pulse 93 11/30/2018  1:03 PM  Resp 12 11/30/2018  1:03 PM  SpO2 99 % 11/30/2018  1:03 PM  Vitals shown include unvalidated device data.  Last Pain:  Vitals:   11/30/18 0559  TempSrc:   PainSc: 0-No pain         Complications: No apparent anesthesia complications

## 2018-11-30 NOTE — Anesthesia Procedure Notes (Signed)
Procedure Name: Intubation Date/Time: 11/30/2018 7:51 AM Performed by: Lance Coon, CRNA Pre-anesthesia Checklist: Patient identified, Emergency Drugs available, Suction available, Patient being monitored and Timeout performed Patient Re-evaluated:Patient Re-evaluated prior to induction Oxygen Delivery Method: Circle system utilized Preoxygenation: Pre-oxygenation with 100% oxygen Induction Type: IV induction and Rapid sequence Laryngoscope Size: Glidescope and 4 Grade View: Grade I Tube type: Oral Tube size: 7.5 mm Number of attempts: 1 Airway Equipment and Method: Stylet Placement Confirmation: ETT inserted through vocal cords under direct vision,  positive ETCO2 and breath sounds checked- equal and bilateral Secured at: 23 cm Tube secured with: Tape Dental Injury: Teeth and Oropharynx as per pre-operative assessment

## 2018-12-01 LAB — GLUCOSE, CAPILLARY: Glucose-Capillary: 127 mg/dL — ABNORMAL HIGH (ref 70–99)

## 2018-12-01 MED ORDER — OXYCODONE HCL 10 MG PO TABS: 5.0000 mg | ORAL_TABLET | ORAL | 0 refills | Status: DC | PRN

## 2018-12-01 MED ORDER — METHOCARBAMOL 500 MG PO TABS: 500.0000 mg | ORAL_TABLET | Freq: Four times a day (QID) | ORAL | 1 refills | Status: DC | PRN

## 2018-12-01 NOTE — Progress Notes (Signed)
Subjective: Patient reports feeling good  Objective: Vital signs in last 24 hours: Temp:  [97.6 F (36.4 C)-98.4 F (36.9 C)] 98.2 F (36.8 C) (04/18 0800) Pulse Rate:  [78-94] 84 (04/18 0800) Resp:  [12-37] 16 (04/18 0800) BP: (96-146)/(66-89) 98/66 (04/18 0800) SpO2:  [93 %-100 %] 97 % (04/18 0800) Weight:  [78.1 kg] 78.1 kg (04/17 1414)  Intake/Output from previous day: 04/17 0701 - 04/18 0700 In: 3087.4 [I.V.:2487.4; IV Piggyback:600] Out: 2435 [Urine:1685; Blood:750] Intake/Output this shift: Total I/O In: -  Out: 150 [Urine:150]  Physical Exam: Dressing CDI.  Up ambulating.  Leg pain better.  No headache.  Lab Results: No results for input(s): WBC, HGB, HCT, PLT in the last 72 hours. BMET No results for input(s): NA, K, CL, CO2, GLUCOSE, BUN, CREATININE, CALCIUM in the last 72 hours.  Studies/Results: Dg Lumbar Spine 2-3 Views  Result Date: 11/30/2018 CLINICAL DATA:  PLIF L3-4. EXAM: LUMBAR SPINE - 2-3 VIEW; DG C-ARM 61-120 MIN COMPARISON:  04/07/2016 FINDINGS: 2 C-arm images show removal of previous L4-5 hardware. There is posterior decompression, diskectomy and fusion at L3-4 with bilateral pedicle screws and posterior rods. There is anterolisthesis at L4-5 of approximately 8 or 9 mm. IMPRESSION: New PLIF L3-4 has a good appearance without visible complication. Electronically Signed   By: Nelson Chimes M.D.   On: 11/30/2018 13:40   Dg C-arm 1-60 Min  Result Date: 11/30/2018 CLINICAL DATA:  PLIF L3-4. EXAM: LUMBAR SPINE - 2-3 VIEW; DG C-ARM 61-120 MIN COMPARISON:  04/07/2016 FINDINGS: 2 C-arm images show removal of previous L4-5 hardware. There is posterior decompression, diskectomy and fusion at L3-4 with bilateral pedicle screws and posterior rods. There is anterolisthesis at L4-5 of approximately 8 or 9 mm. IMPRESSION: New PLIF L3-4 has a good appearance without visible complication. Electronically Signed   By: Nelson Chimes M.D.   On: 11/30/2018 13:40     Assessment/Plan: Patient is doing well.  Discharge home.     LOS: 1 day    Peggyann Shoals, MD 12/01/2018, 9:59 AM

## 2018-12-01 NOTE — Discharge Summary (Signed)
Physician Discharge Summary  Patient ID: Scott Parks MRN: [id number] DOB/AGE: [birth year] 78 y.o.  Admit date: 11/30/2018 Discharge date: 12/01/2018  Admission Diagnoses:Lumbar scoliosis with foraminal stenosis  Discharge Diagnoses: Same Active Problems:   S/P lumbar fusion   Discharged Condition: good  Hospital Course: Patient underwent decompression and fusion L 34 level.  He was kept flat overnight, but then mobilized without difficulty and with resolution of his leg pain the following day.  He was discharged home in stable condition, having tolerated his operation and hospitalization without difficulty.  Consults: None  Significant Diagnostic Studies: None  Treatments: surgery: decompression and fusion L 34 level  Discharge Exam: Blood pressure 98/66, pulse 84, temperature 98.2 F (36.8 C), temperature source Oral, resp. rate 16, height 5\' 9"  (1.753 m), weight 78.1 kg, SpO2 97 %. Neurologic: Alert and oriented X 3, normal strength and tone. Normal symmetric reflexes. Normal coordination and gait Wound:CDI  Disposition: Home  Discharge Instructions    Diet - low sodium heart healthy   Complete by:  As directed    Increase activity slowly   Complete by:  As directed      Allergies as of 12/01/2018   No Known Allergies     Medication List    TAKE these medications   atorvastatin 40 MG tablet Commonly known as:  LIPITOR Take 1 tablet (40 mg total) by mouth daily.   glucose blood test strip Needs Test strips and lancets for a ONE TOUCH VERIO IQ MACHINE   glucose blood test strip Commonly known as:  OneTouch Verio Use as instructed   metFORMIN 1000 MG tablet Commonly known as:  GLUCOPHAGE TAKE 1 TABLET TWICE A DAY WITH MEALS   methocarbamol 500 MG tablet Commonly known as:  ROBAXIN Take 1 tablet (500 mg total) by mouth every 6 (six) hours as needed for muscle spasms.   MULTI-DELYN/IRON PO Take 2 tablets by mouth daily.   Oxycodone HCl 10 MG Tabs Take  0.5-1 tablets (5-10 mg total) by mouth every 4 (four) hours as needed for severe pain ((score 7 to 10)).   quinapril 40 MG tablet Commonly known as:  ACCUPRIL Take 1 tablet (40 mg total) by mouth daily.   vitamin C 500 MG tablet Commonly known as:  ASCORBIC ACID Take 1,000 mg by mouth daily.            Durable Medical Equipment  (From admission, onward)         Start     Ordered   11/30/18 1421  DME Walker rolling  Once    Question:  Patient needs a walker to treat with the following condition  Answer:  S/P lumbar fusion   11/30/18 1420   11/30/18 1421  DME 3 n 1  Once     11/30/18 1420           Signed: Peggyann Shoals, MD 12/01/2018, 10:07 AM

## 2018-12-01 NOTE — Evaluation (Signed)
Physical Therapy Evaluation Patient Details Name: Scott Parks MRN: [id number] DOB: April 18, 1941 Today's Date: 12/01/2018   History of Present Illness  Patient is a 78 yo male s/p  Posterior lumber interbody fusion lumbar three-four extension of hardware - Posterior Lateral and Interbody fusion (N/A Back)  Clinical Impression  Patient seen for mobility assessment s/p spinal surgery. Mobilizing well. Educated patient on precautions, mobility expectations, safety and car transfers. No further acute PT needs. Will sign off.     Follow Up Recommendations No PT follow up    Equipment Recommendations  None recommended by PT    Recommendations for Other Services       Precautions / Restrictions Precautions Precautions: Back Precaution Booklet Issued: Yes (comment) Precaution Comments: reviewed with patient Required Braces or Orthoses: Spinal Brace Spinal Brace: Lumbar corset      Mobility  Bed Mobility Overal bed mobility: Modified Independent                Transfers Overall transfer level: Modified independent               General transfer comment: increased time, no physical assist  Ambulation/Gait Ambulation/Gait assistance: Supervision Gait Distance (Feet): 310 Feet Assistive device: None Gait Pattern/deviations: WFL(Within Functional Limits) Gait velocity: modestly descreased Gait velocity interpretation: 1.31 - 2.62 ft/sec, indicative of limited community ambulator General Gait Details: steady with ambulation  Stairs Stairs: Yes Stairs assistance: Supervision Stair Management: One rail Left Number of Stairs: 4 General stair comments: supervision for safety  Wheelchair Mobility    Modified Rankin (Stroke Patients Only)       Balance Overall balance assessment: Mild deficits observed, not formally tested                                           Pertinent Vitals/Pain Pain Assessment: Faces Pain Score: 4  Faces Pain  Scale: Hurts little more Pain Location: surgical site Pain Descriptors / Indicators: Guarding Pain Intervention(s): Monitored during session    Home Living Family/patient expects to be discharged to:: Private residence Living Arrangements: Spouse/significant other Available Help at Discharge: Family;Available 24 hours/day Type of Home: House Home Access: Stairs to enter Entrance Stairs-Rails: Right;Can reach both Entrance Stairs-Number of Steps: 3 Home Layout: Able to live on main level with bedroom/bathroom Home Equipment: Walker - 2 wheels;Cane - single point;Shower seat;Bedside commode      Prior Function Level of Independence: Independent               Hand Dominance   Dominant Hand: Right    Extremity/Trunk Assessment   Upper Extremity Assessment Upper Extremity Assessment: Overall WFL for tasks assessed    Lower Extremity Assessment Lower Extremity Assessment: Overall WFL for tasks assessed    Cervical / Trunk Assessment Cervical / Trunk Assessment: (s/p spinal surgery)  Communication   Communication: No difficulties  Cognition Arousal/Alertness: Awake/alert Behavior During Therapy: WFL for tasks assessed/performed Overall Cognitive Status: Within Functional Limits for tasks assessed                                        General Comments      Exercises     Assessment/Plan    PT Assessment Patent does not need any further PT services  PT Problem List  PT Treatment Interventions      PT Goals (Current goals can be found in the Care Plan section)  Acute Rehab PT Goals PT Goal Formulation: All assessment and education complete, DC therapy    Frequency     Barriers to discharge        Co-evaluation               AM-PAC PT "6 Clicks" Mobility  Outcome Measure Help needed turning from your back to your side while in a flat bed without using bedrails?: None Help needed moving from lying on your back to sitting  on the side of a flat bed without using bedrails?: None Help needed moving to and from a bed to a chair (including a wheelchair)?: None Help needed standing up from a chair using your arms (e.g., wheelchair or bedside chair)?: None Help needed to walk in hospital room?: None Help needed climbing 3-5 steps with a railing? : A Little 6 Click Score: 23    End of Session Equipment Utilized During Treatment: Gait belt;Back brace Activity Tolerance: Patient tolerated treatment well Patient left: in chair;with call bell/phone within reach Nurse Communication: Mobility status PT Visit Diagnosis: Difficulty in walking, not elsewhere classified (R26.2)    Time: 1552-0802 PT Time Calculation (min) (ACUTE ONLY): 17 min   Charges:   PT Evaluation $PT Eval Low Complexity: [place], PT DPT  Board Certified Neurologic Specialist Acute Rehabilitation Services Pager [phone] Office [place] 12/01/2018, 10:59 AM

## 2019-01-10 DIAGNOSIS — M4316 Spondylolisthesis, lumbar region: Secondary | ICD-10-CM | POA: Diagnosis not present

## 2019-01-15 ENCOUNTER — Encounter: Payer: Self-pay | Admitting: Family Medicine

## 2019-01-15 ENCOUNTER — Ambulatory Visit (INDEPENDENT_AMBULATORY_CARE_PROVIDER_SITE_OTHER): Payer: Medicare Other | Admitting: Family Medicine

## 2019-01-15 ENCOUNTER — Other Ambulatory Visit: Payer: Self-pay

## 2019-01-15 VITALS — BP 132/84 | HR 73 | Temp 98.7°F | Ht 69.0 in | Wt 172.2 lb

## 2019-01-15 DIAGNOSIS — E785 Hyperlipidemia, unspecified: Secondary | ICD-10-CM

## 2019-01-15 DIAGNOSIS — E1159 Type 2 diabetes mellitus with other circulatory complications: Secondary | ICD-10-CM | POA: Diagnosis not present

## 2019-01-15 DIAGNOSIS — I10 Essential (primary) hypertension: Secondary | ICD-10-CM

## 2019-01-15 DIAGNOSIS — E1169 Type 2 diabetes mellitus with other specified complication: Secondary | ICD-10-CM

## 2019-01-15 DIAGNOSIS — E118 Type 2 diabetes mellitus with unspecified complications: Secondary | ICD-10-CM

## 2019-01-15 DIAGNOSIS — Z981 Arthrodesis status: Secondary | ICD-10-CM

## 2019-01-15 DIAGNOSIS — I152 Hypertension secondary to endocrine disorders: Secondary | ICD-10-CM

## 2019-01-15 LAB — POCT GLYCOSYLATED HEMOGLOBIN (HGB A1C): Hemoglobin A1C: 8.4 % — AB (ref 4.0–5.6)

## 2019-01-15 NOTE — Progress Notes (Signed)
Scott Parks is a 78 y.o. male who presents for annual wellness visit and follow-up on chronic medical conditions.  He has had recent back surgery and is recuperating from this.  He is now walking over a mile per day.  Review of his hemoglobin A1c information shows that this is slowly coming down.  His eating habits are quite good.  He does check his feet and is trying to schedule an eye exam.  Continues on quinapril, metformin and Lipitor and having no difficulty with them.  He does not smoke and rarely drinks.  Some blood work was done prior to his surgery.   Immunizations and Health Maintenance Immunization History  Administered Date(s) Administered  . Influenza, High Dose Seasonal PF 07/01/2015, 07/04/2018  . Influenza-Unspecified 07/13/2016  . Pneumococcal Conjugate-13 12/29/2014  . Pneumococcal-Unspecified 07/20/2007  . Tdap 06/21/2016  . Zoster 12/28/2009  . Zoster Recombinat (Shingrix) 12/07/2016, 06/09/2017   Health Maintenance Due  Topic Date Due  . PNA vac Low Risk Adult (2 of 2 - PPSV23) 12/29/2015  . OPHTHALMOLOGY EXAM  05/03/2017    Last colonoscopy:11/22/17 Last PSA:10/22/13 Dentist:10/2018 Ophtho:over a year Exercise: walking qd  Other doctors caring for patient include:  Dr. Carlean Purl GI, Dr. Blair Dolphin [facility] Advanced Directives: Yes.  Information asked for.    Depression screen:  See questionnaire below.     Depression screen [facility] 2/9 06/07/2018 05/03/2018 03/08/2017 07/06/2016 07/01/2015  Decreased Interest 0 0 0 0 0  Down, Depressed, Hopeless 0 0 0 0 0  PHQ - 2 Score 0 0 0 0 0    Fall Screen: See Questionaire below.   Fall Risk  06/07/2018 05/03/2018 03/08/2017 07/06/2016 07/01/2015  Falls in the past year? No No No No No    ADL screen:  See questionnaire below.  Functional Status Survey:     Review of Systems  Constitutional: -, -unexpected weight change, -anorexia, -fatigue   Psych: Normal mood, affect, hygiene and grooming Hemoglobin A1c is  8.4 ASSESSMENT/PLAN: Type 2 diabetes mellitus with complication, without long-term current use of insulin (HCC)  Hyperlipidemia associated with type 2 diabetes mellitus ([place])  Hypertension associated with diabetes ([place])  S/P lumbar spinal fusion   recommended at least 30 minutes of aerobic activity at least 5 days/week;d; healthy diet and alcohol recommendations (less than or equal to 2 drinks/day) reviewed; regular seatbelt use; Marland Kitchen Immunization recommendations discussed.  Colonoscopy recommendations reviewed. Also recommended daily baby aspirin  Medicare Attestation I have personally reviewed: The patient's medical and social history Their use of alcohol, tobacco or illicit drugs Their current medications and supplements The patient's functional ability including ADLs,fall risks, home safety risks, cognitive, and hearing and visual impairment Diet and physical activities Evidence for depression or mood disorders  The patient's weight, height, and BMI have been recorded in the chart.  I have made referrals, counseling, and provided education to the patient based on review of the above and I have provided the patient with a written personalized care plan for preventive services.     Jill Alexanders, MD   01/15/2019

## 2019-01-15 NOTE — Patient Instructions (Addendum)
  Scott Parks , Thank you for taking time to come for your Medicare Wellness Visit. I appreciate your ongoing commitment to your health goals. Please review the following plan we discussed and let me know if I can assist you in the future.   These are the goals we discussed: Continue to take good care of himself and increase his physical activity.  I think his hemoglobin A1c numbers will get better. This is a list of the screening recommended for you and due dates:  Health Maintenance  Topic Date Due  . Eye exam for diabetics  05/03/2017  . Flu Shot  03/16/2019  . Complete foot exam   05/04/2019  . Hemoglobin A1C  05/24/2019  . Tetanus Vaccine  06/21/2026  . Colon Cancer Screening  11/23/2027  . Pneumonia vaccines  Discontinued  Also recommend baby aspirin daily

## 2019-01-15 NOTE — Addendum Note (Signed)
Addended by: Elyse Jarvis on: 01/15/2019 10:01 AM   Modules accepted: Orders

## 2019-02-09 ENCOUNTER — Other Ambulatory Visit: Payer: Self-pay | Admitting: Family Medicine

## 2019-04-11 DIAGNOSIS — M4316 Spondylolisthesis, lumbar region: Secondary | ICD-10-CM | POA: Diagnosis not present

## 2019-05-19 IMAGING — US US AORTA SCREENING (MEDICARE)
1 series · 14 of 18 positions shown · non-contrast
Comparison: None.

CLINICAL DATA: Male between 65-75 years of age with a smoking
history.

EXAM:
US ABDOMINAL AORTA MEDICARE SCREENING
TECHNIQUE: Ultrasound examination of the abdominal aorta was performed as a
screening evaluation for abdominal aortic aneurysm.

[Series 1: us aorta screening (medicare) · 0.33mm/px · 14 of 18 slices shown]
[im 1/18]
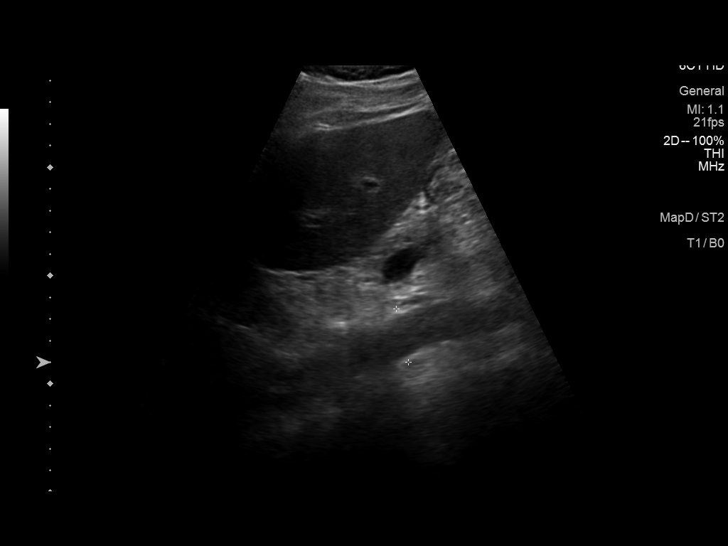
[im 2/18]
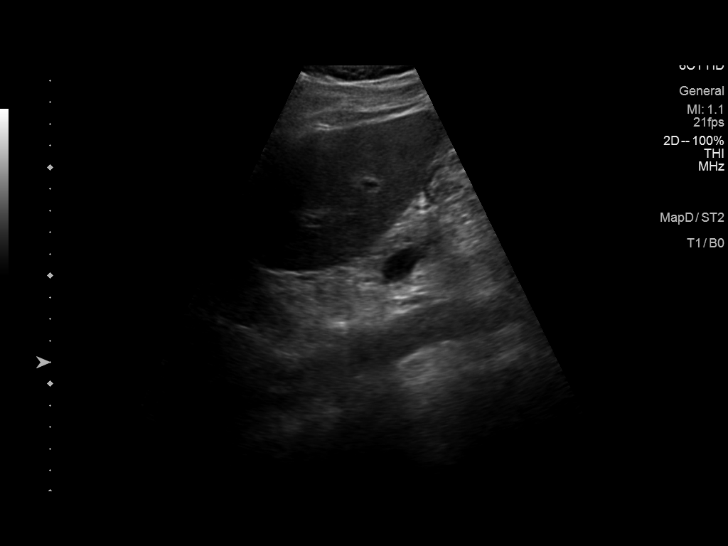
[im 4/18]
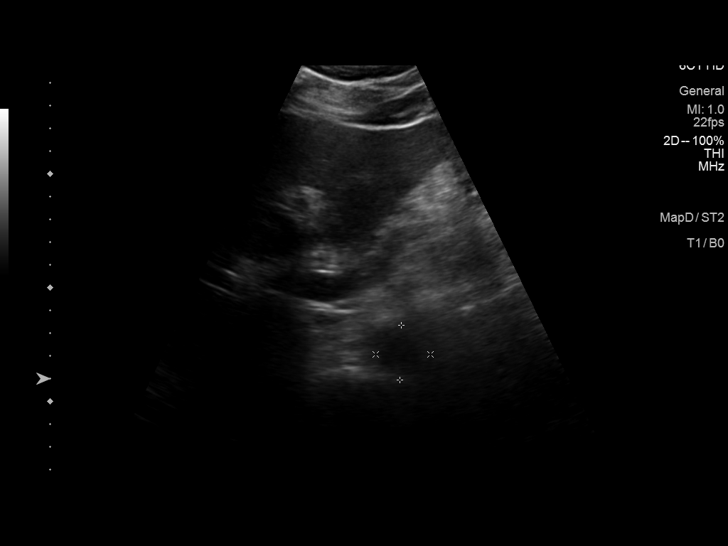
[im 5/18]
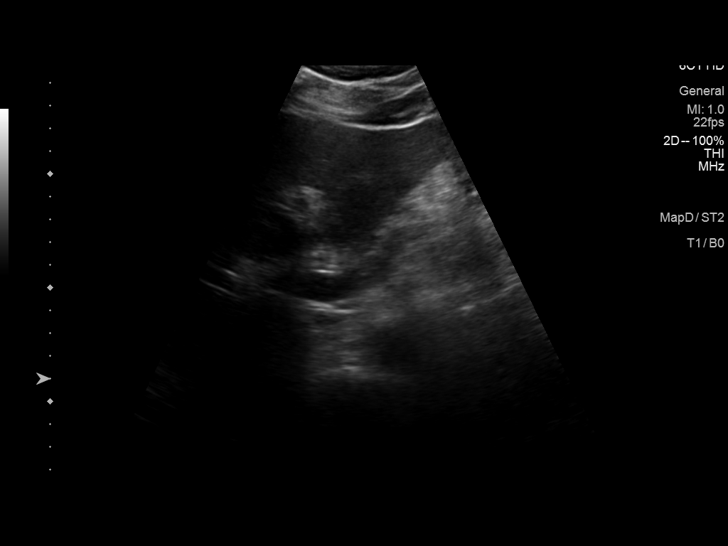
[im 6/18]
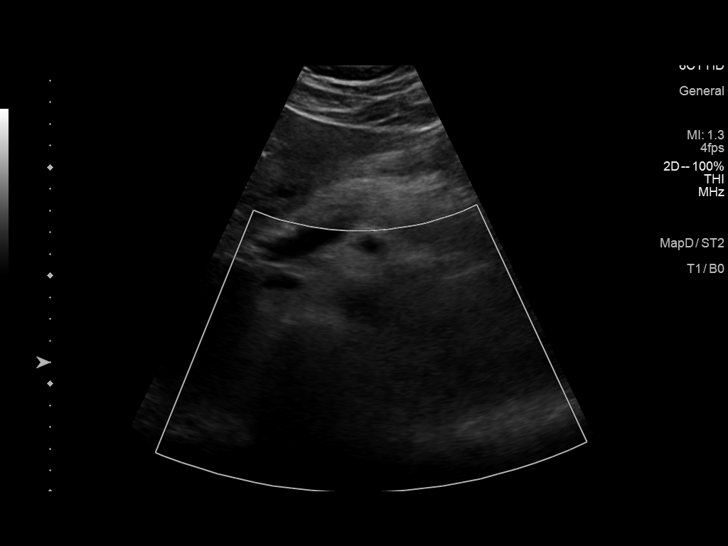
[im 8/18]
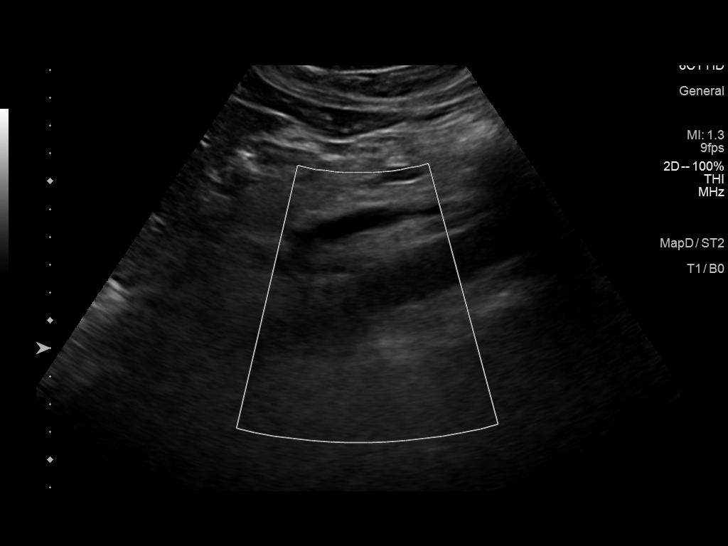
[im 9/18]
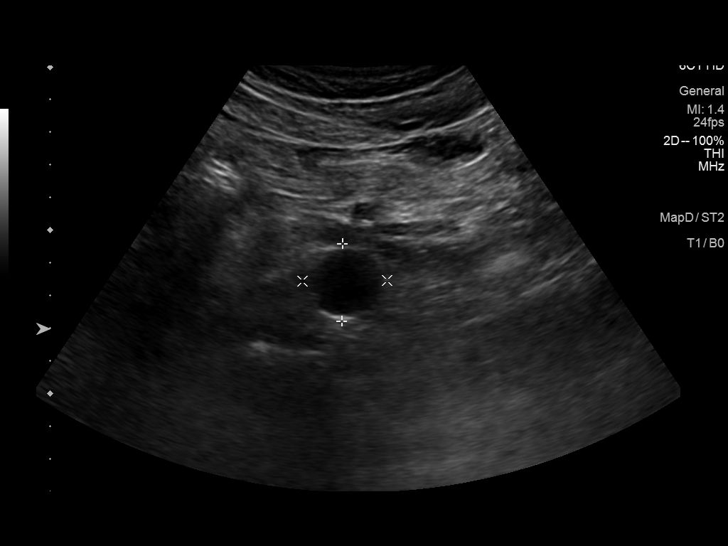
[im 10/18]
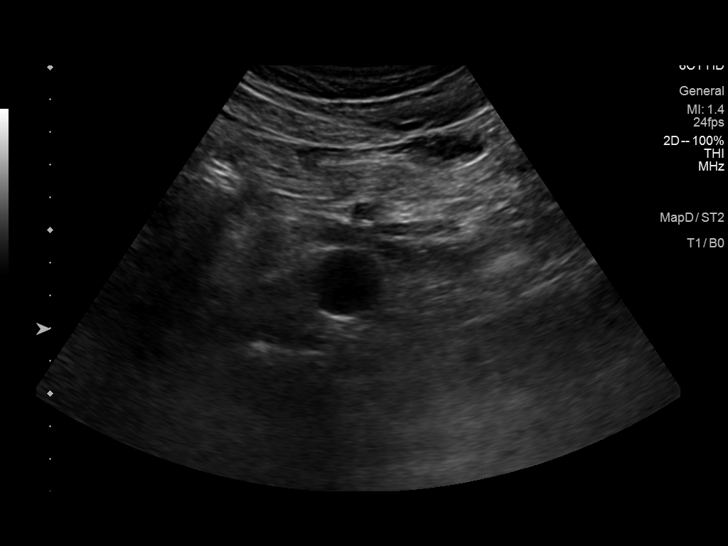
[im 11/18]
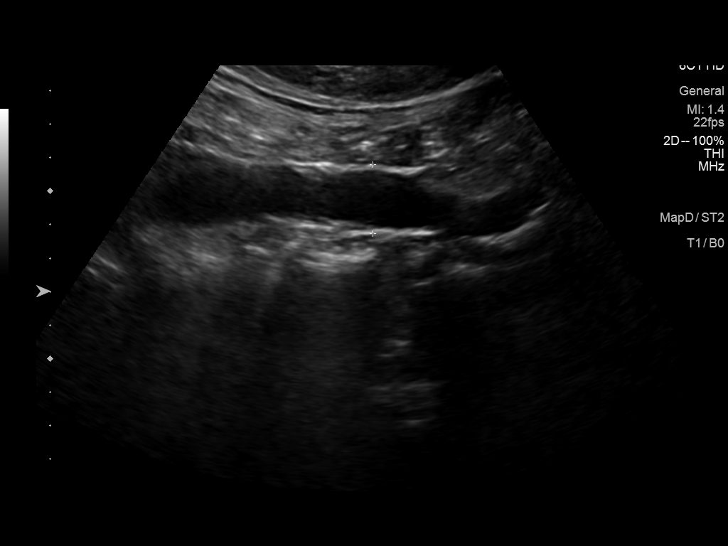
[im 13/18]
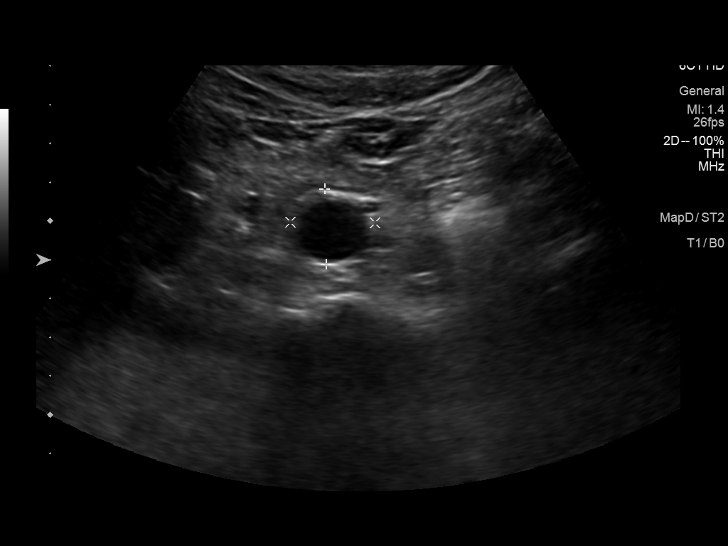
[im 14/18]
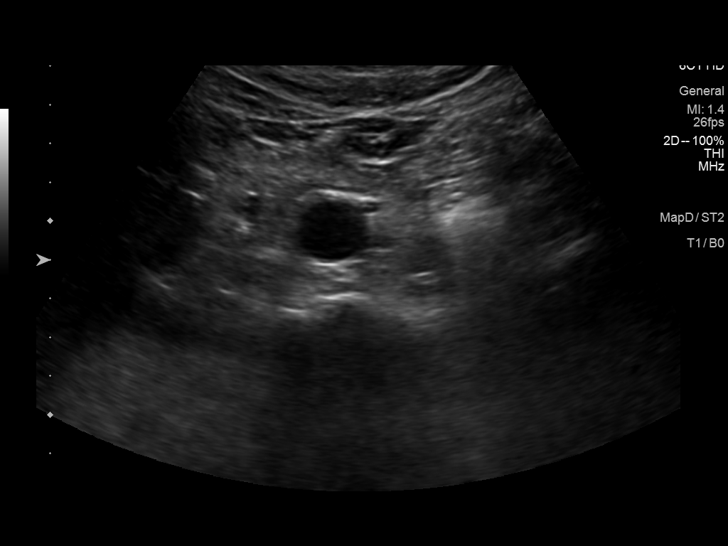
[im 15/18]
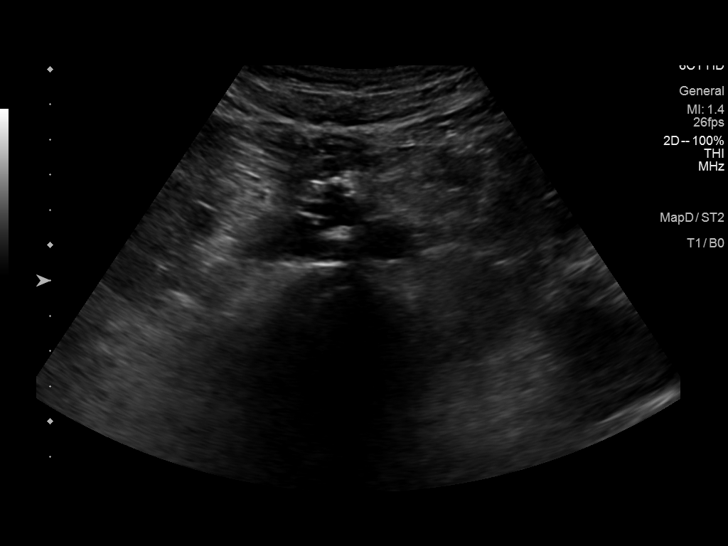
[im 17/18]
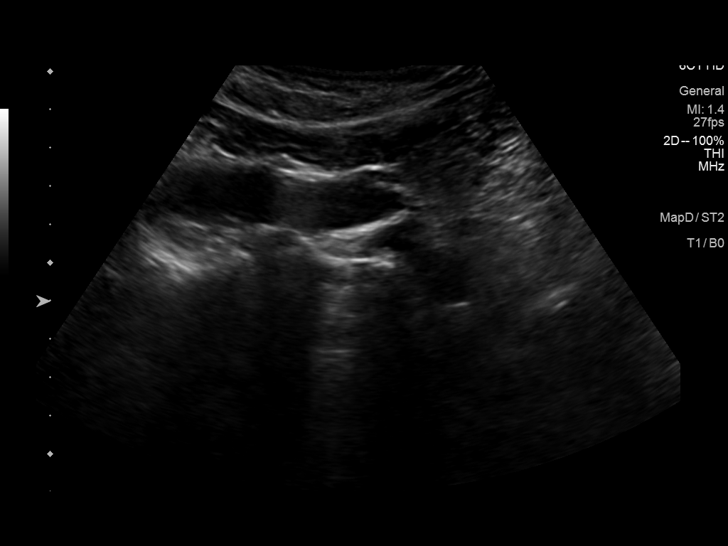
[im 18/18]
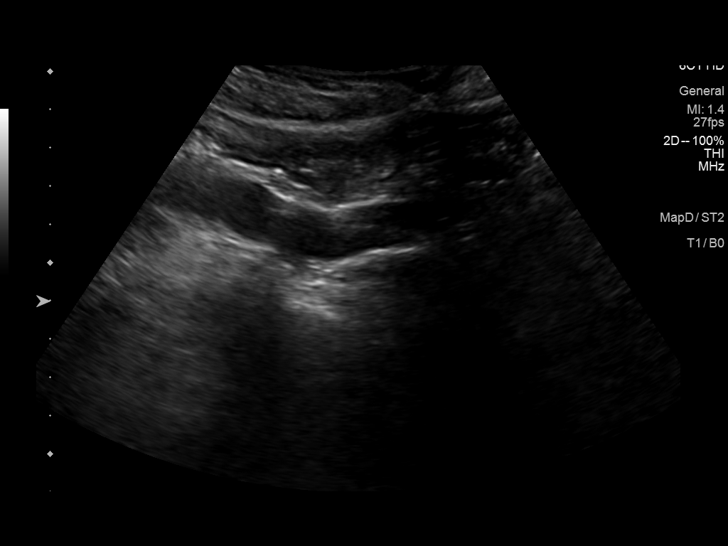

[14 of 18 positions shown; findings below may reference images not displayed]

FINDINGS: Abdominal aortic measurements as follows:

Proximal:  2.6 cm

Mid:  2.6 cm

Distal:  2.2 cm

Minor atherosclerotic change. No significant aneurysm. Maximal
diameter 2.6 cm proximally.
IMPRESSION: Minor aortic atherosclerosis and proximal ectasia.

Ectatic abdominal aorta at risk for aneurysm development. Recommend
followup by ultrasound in 5 years. This recommendation follows ACR
consensus guidelines: White Paper of the ACR Incidental Findings
Committee II on Vascular Findings. [HOSPITAL] [id number];

## 2019-05-22 ENCOUNTER — Encounter: Payer: Self-pay | Admitting: Family Medicine

## 2019-05-22 ENCOUNTER — Other Ambulatory Visit: Payer: Self-pay

## 2019-05-22 ENCOUNTER — Ambulatory Visit (INDEPENDENT_AMBULATORY_CARE_PROVIDER_SITE_OTHER): Payer: Medicare Other | Admitting: Family Medicine

## 2019-05-22 VITALS — BP 142/82 | HR 72 | Temp 96.8°F | Wt 171.4 lb

## 2019-05-22 DIAGNOSIS — E118 Type 2 diabetes mellitus with unspecified complications: Secondary | ICD-10-CM | POA: Diagnosis not present

## 2019-05-22 DIAGNOSIS — E1159 Type 2 diabetes mellitus with other circulatory complications: Secondary | ICD-10-CM

## 2019-05-22 DIAGNOSIS — E785 Hyperlipidemia, unspecified: Secondary | ICD-10-CM

## 2019-05-22 DIAGNOSIS — E1169 Type 2 diabetes mellitus with other specified complication: Secondary | ICD-10-CM

## 2019-05-22 DIAGNOSIS — Z23 Encounter for immunization: Secondary | ICD-10-CM

## 2019-05-22 DIAGNOSIS — I10 Essential (primary) hypertension: Secondary | ICD-10-CM | POA: Diagnosis not present

## 2019-05-22 DIAGNOSIS — I152 Hypertension secondary to endocrine disorders: Secondary | ICD-10-CM

## 2019-05-22 LAB — POCT GLYCOSYLATED HEMOGLOBIN (HGB A1C): Hemoglobin A1C: 8.3 % — AB (ref 4.0–5.6)

## 2019-05-22 LAB — POCT UA - MICROALBUMIN
Albumin/Creatinine Ratio, Urine, POC: 6.1
Creatinine, POC: 169.7 mg/dL
Microalbumin Ur, POC: 10.4 mg/L

## 2019-05-22 MED ORDER — FARXIGA 5 MG PO TABS: 5.0000 mg | ORAL_TABLET | Freq: Every day | ORAL | 5 refills | Status: DC

## 2019-05-22 NOTE — Progress Notes (Signed)
  Subjective:    Patient ID: Scott Parks, male    DOB: [birth year], 78 y.o.   MRN: [id number]  Scott Parks is a 78 y.o. male who presents for follow-up of Type 2 diabetes mellitus.  He did see his dentist recently and has a question about a lesion in his neck.  Home blood sugar records: meter records 118 to 230 Current symptoms/problems include none at this time . Daily foot checks: yes   Any foot concerns: none Exercise: walking , running , weights  Diet:regular He continues on metformin without difficulty.  He is also taking quinapril.  Continues on Lipitor without difficulties.   The following portions of the patient's history were reviewed and updated as appropriate: allergies, current medications, past medical history, past social history and problem list. ROS as in subjective above.     Objective:    Physical Exam Alert and in no distress .  Throat is clear.  Neck is supple without adenopathy or thyromegaly.  The area that he pointed out did show a round smooth movable lesion but it did not appear to be adenomatous but more likely cystic. Hemoglobin A1c is 8.3  Lab Review Diabetic Labs Latest Ref Rng & Units 01/15/2019 11/22/2018 08/29/2018 05/03/2018 08/31/2017  HbA1c 4.0 - 5.6 % 8.4(A) 8.5(H) 9.4(A) 7.9(A) 7.8  Microalbumin mg/L - - - - 12.3  Micro/Creat Ratio - - - - - <50  Chol 100 - 199 mg/dL - - - 167 -  HDL >39 mg/dL - - - 81 -  Calc LDL 0 - 99 mg/dL - - - 71 -  Triglycerides 0 - 149 mg/dL - - - 75 -  Creatinine 0.61 - 1.24 mg/dL - 1.09 - 1.12 -   BP/Weight 01/15/2019 12/01/2018 11/30/2018 11/22/2018 0000000  Systolic BP Q000111Q 98 - A999333 A999333  Diastolic BP 84 66 - 79 80  Wt. (Lbs) 172.2 - 172.18 170.4 171.6  BMI 25.43 - 25.43 25.16 25.34   Foot/eye exam completion dates Latest Ref Rng & Units 05/03/2018 05/03/2016  Eye Exam No Retinopathy - No Retinopathy  Foot Form Completion - Done -    Scott Parks  reports that he quit smoking about 34 years ago. His smoking use included  cigarettes. He has never used smokeless tobacco. He reports current alcohol use. He reports that he does not use drugs.     Assessment & Plan:    Need for influenza vaccination - Plan: Flu Vaccine QUAD High Dose(Fluad)  Hypertension associated with diabetes ([place]) - Plan: dapagliflozin propanediol (FARXIGA) 5 MG TABS tablet, CBC with Differential/Platelet  Hyperlipidemia associated with type 2 diabetes mellitus ([place]) - Plan: Lipid panel  Type 2 diabetes mellitus with complication, without long-term current use of insulin (HCC) - Plan: dapagliflozin propanediol (FARXIGA) 5 MG TABS tablet, CBC with Differential/Platelet, Comprehensive metabolic panel, POCT UA - Microalbumin, POCT glycosylated hemoglobin (Hb A1C) I explained that since his diabetes is not under good control, we need to add another medication to his regimen.  I explained that this might be expensive but I think there are many benefits to it.  He will let me know if he has any troubles with that. I explained that I did not think the lesion in his neck was of any major concern.  He was comfortable with that.

## 2019-05-23 LAB — CBC WITH DIFFERENTIAL/PLATELET
Basophils Absolute: 0 10*3/uL (ref 0.0–0.2)
Basos: 1 %
EOS (ABSOLUTE): 0.3 10*3/uL (ref 0.0–0.4)
Eos: 5 %
Hematocrit: 41.7 % (ref 37.5–51.0)
Hemoglobin: 13.6 g/dL (ref 13.0–17.7)
Immature Grans (Abs): 0 10*3/uL (ref 0.0–0.1)
Immature Granulocytes: 0 %
Lymphocytes Absolute: 1.5 10*3/uL (ref 0.7–3.1)
Lymphs: 26 %
MCH: 26.2 pg — ABNORMAL LOW (ref 26.6–33.0)
MCHC: 32.6 g/dL (ref 31.5–35.7)
MCV: 80 fL (ref 79–97)
Monocytes Absolute: 0.4 10*3/uL (ref 0.1–0.9)
Monocytes: 7 %
Neutrophils Absolute: 3.7 10*3/uL (ref 1.4–7.0)
Neutrophils: 61 %
Platelets: 176 10*3/uL (ref 150–450)
RBC: 5.19 x10E6/uL (ref 4.14–5.80)
RDW: 16.2 % — ABNORMAL HIGH (ref 11.6–15.4)
WBC: 6 10*3/uL (ref 3.4–10.8)

## 2019-05-23 LAB — LIPID PANEL
Chol/HDL Ratio: 2.1 ratio (ref 0.0–5.0)
Cholesterol, Total: 148 mg/dL (ref 100–199)
HDL: 72 mg/dL (ref >39)
LDL Chol Calc (NIH): 64 mg/dL (ref 0–99)
Triglycerides: 58 mg/dL (ref 0–149)
VLDL Cholesterol Cal: 12 mg/dL (ref 5–40)

## 2019-05-23 LAB — COMPREHENSIVE METABOLIC PANEL
ALT: 15 IU/L (ref 0–44)
AST: 14 IU/L (ref 0–40)
Albumin/Globulin Ratio: 2.1 (ref 1.2–2.2)
Albumin: 4.5 g/dL (ref 3.7–4.7)
Alkaline Phosphatase: 111 IU/L (ref 39–117)
BUN/Creatinine Ratio: 15 (ref 10–24)
BUN: 17 mg/dL (ref 8–27)
Bilirubin Total: 0.5 mg/dL (ref 0.0–1.2)
CO2: 25 mmol/L (ref 20–29)
Calcium: 10 mg/dL (ref 8.6–10.2)
Chloride: 104 mmol/L (ref 96–106)
Creatinine, Ser: 1.1 mg/dL (ref 0.76–1.27)
GFR calc Af Amer: 75 mL/min/{1.73_m2} (ref >59)
GFR calc non Af Amer: 65 mL/min/{1.73_m2} (ref >59)
Globulin, Total: 2.1 g/dL (ref 1.5–4.5)
Glucose: 150 mg/dL — ABNORMAL HIGH (ref 65–99)
Potassium: 5.4 mmol/L — ABNORMAL HIGH (ref 3.5–5.2)
Sodium: 141 mmol/L (ref 134–144)
Total Protein: 6.6 g/dL (ref 6.0–8.5)

## 2019-06-06 DIAGNOSIS — E119 Type 2 diabetes mellitus without complications: Secondary | ICD-10-CM | POA: Diagnosis not present

## 2019-06-06 LAB — HM DIABETES EYE EXAM

## 2019-07-11 ENCOUNTER — Other Ambulatory Visit: Payer: Self-pay | Admitting: Family Medicine

## 2019-07-11 DIAGNOSIS — I10 Essential (primary) hypertension: Secondary | ICD-10-CM

## 2019-07-11 DIAGNOSIS — E785 Hyperlipidemia, unspecified: Secondary | ICD-10-CM

## 2019-07-11 DIAGNOSIS — E1169 Type 2 diabetes mellitus with other specified complication: Secondary | ICD-10-CM

## 2019-09-25 ENCOUNTER — Ambulatory Visit (INDEPENDENT_AMBULATORY_CARE_PROVIDER_SITE_OTHER): Payer: Medicare Other | Admitting: Family Medicine

## 2019-09-25 ENCOUNTER — Encounter: Payer: Self-pay | Admitting: Family Medicine

## 2019-09-25 ENCOUNTER — Other Ambulatory Visit: Payer: Self-pay

## 2019-09-25 VITALS — BP 120/72 | HR 86 | Temp 98.0°F | Wt 167.4 lb

## 2019-09-25 DIAGNOSIS — E1169 Type 2 diabetes mellitus with other specified complication: Secondary | ICD-10-CM | POA: Diagnosis not present

## 2019-09-25 DIAGNOSIS — I10 Essential (primary) hypertension: Secondary | ICD-10-CM

## 2019-09-25 DIAGNOSIS — E1159 Type 2 diabetes mellitus with other circulatory complications: Secondary | ICD-10-CM

## 2019-09-25 DIAGNOSIS — E785 Hyperlipidemia, unspecified: Secondary | ICD-10-CM

## 2019-09-25 DIAGNOSIS — E118 Type 2 diabetes mellitus with unspecified complications: Secondary | ICD-10-CM | POA: Diagnosis not present

## 2019-09-25 LAB — POCT GLYCOSYLATED HEMOGLOBIN (HGB A1C): Hemoglobin A1C: 7.9 % — AB (ref 4.0–5.6)

## 2019-09-25 NOTE — Progress Notes (Signed)
  Subjective:    Patient ID: Scott Parks, male    DOB: [birth year], 79 y.o.   MRN: [id number]  Faraaz Stec is a 79 y.o. male who presents for follow-up of Type 2 diabetes mellitus.  Home blood sugar records: meter record , fasting and post meal, 108-228 Current symptoms/problems include none at this time. Daily foot checks: yes   Any foot concerns: none  Exercise: gym twice a week, walking five times a week. Diet: regular He does have an eye exam set up in the near future.  He also recently had his Covid shots taken care of. He continues on quinapril for his blood pressure.  He is taking atorvastatin and having no aches or pains with that.  Continues on Farxiga and Metformin. The following portions of the patient's history were reviewed and updated as appropriate: allergies, current medications, past medical history, past social history and problem list.  ROS as in subjective above.     Objective:    Physical Exam Alert and in no distress foot exam is normal Hemoglobin A1c is 7.9 Lab Review Diabetic Labs Latest Ref Rng & Units 09/25/2019 05/22/2019 01/15/2019 11/22/2018 08/29/2018  HbA1c 4.0 - 5.6 % 7.9(A) 8.3(A) 8.4(A) 8.5(H) 9.4(A)  Microalbumin mg/L - 10.4 - - -  Micro/Creat Ratio - - 6.1 - - -  Chol 100 - 199 mg/dL - 148 - - -  HDL >39 mg/dL - 72 - - -  Calc LDL 0 - 99 mg/dL - 64 - - -  Triglycerides 0 - 149 mg/dL - 58 - - -  Creatinine 0.76 - 1.27 mg/dL - 1.10 - 1.09 -   BP/Weight 09/25/2019 05/22/2019 01/15/2019 12/01/2018 [id number]  Systolic BP 123456 A999333 Q000111Q 98 -  Diastolic BP 72 82 84 66 -  Wt. (Lbs) 167.4 171.4 172.2 - 172.18  BMI 24.72 25.31 25.43 - 25.43   Foot/eye exam completion dates Latest Ref Rng & Units 09/25/2019 05/03/2018  Eye Exam No Retinopathy - -  Foot Form Completion - Done Done    Pele  reports that he quit smoking about 35 years ago. His smoking use included cigarettes. He has never used smokeless tobacco. He reports current alcohol use. He reports that he  does not use drugs.     Assessment & Plan:    Type 2 diabetes mellitus with complication, without long-term current use of insulin (HCC) - Plan: POCT glycosylated hemoglobin (Hb A1C)  Hypertension associated with diabetes ([place])  Hyperlipidemia associated with type 2 diabetes mellitus ([place])   1. Rx changes: none 2. Education: Reviewed 'ABCs' of diabetes management (respective goals in parentheses):  A1C (<7), blood pressure (<130/80), and cholesterol (LDL <100). 3. Compliance at present is estimated to be good. Efforts to improve compliance (if necessary) will be directed at increased exercise. 4. Follow up: 4 months

## 2019-10-16 DIAGNOSIS — R972 Elevated prostate specific antigen [PSA]: Secondary | ICD-10-CM | POA: Diagnosis not present

## 2019-10-23 DIAGNOSIS — R972 Elevated prostate specific antigen [PSA]: Secondary | ICD-10-CM | POA: Diagnosis not present

## 2019-10-23 DIAGNOSIS — N5201 Erectile dysfunction due to arterial insufficiency: Secondary | ICD-10-CM | POA: Diagnosis not present

## 2020-01-14 ENCOUNTER — Other Ambulatory Visit: Payer: Self-pay | Admitting: Family Medicine

## 2020-01-16 ENCOUNTER — Encounter: Payer: Self-pay | Admitting: Family Medicine

## 2020-01-16 ENCOUNTER — Other Ambulatory Visit: Payer: Self-pay

## 2020-01-16 ENCOUNTER — Ambulatory Visit (INDEPENDENT_AMBULATORY_CARE_PROVIDER_SITE_OTHER): Payer: Medicare Other | Admitting: Family Medicine

## 2020-01-16 VITALS — BP 122/74 | HR 83 | Temp 98.4°F | Ht 67.5 in | Wt 162.6 lb

## 2020-01-16 DIAGNOSIS — R972 Elevated prostate specific antigen [PSA]: Secondary | ICD-10-CM | POA: Diagnosis not present

## 2020-01-16 DIAGNOSIS — Z981 Arthrodesis status: Secondary | ICD-10-CM

## 2020-01-16 DIAGNOSIS — N400 Enlarged prostate without lower urinary tract symptoms: Secondary | ICD-10-CM

## 2020-01-16 DIAGNOSIS — Z86010 Personal history of colonic polyps: Secondary | ICD-10-CM

## 2020-01-16 DIAGNOSIS — E1169 Type 2 diabetes mellitus with other specified complication: Secondary | ICD-10-CM | POA: Diagnosis not present

## 2020-01-16 DIAGNOSIS — E1159 Type 2 diabetes mellitus with other circulatory complications: Secondary | ICD-10-CM

## 2020-01-16 DIAGNOSIS — E118 Type 2 diabetes mellitus with unspecified complications: Secondary | ICD-10-CM

## 2020-01-16 DIAGNOSIS — E785 Hyperlipidemia, unspecified: Secondary | ICD-10-CM | POA: Diagnosis not present

## 2020-01-16 DIAGNOSIS — I10 Essential (primary) hypertension: Secondary | ICD-10-CM | POA: Diagnosis not present

## 2020-01-16 DIAGNOSIS — N529 Male erectile dysfunction, unspecified: Secondary | ICD-10-CM

## 2020-01-16 LAB — POCT GLYCOSYLATED HEMOGLOBIN (HGB A1C): Hemoglobin A1C: 7.5 % — AB (ref 4.0–5.6)

## 2020-01-16 NOTE — Progress Notes (Deleted)
Scott Parks is a 79 y.o. male who presents for annual wellness visit and follow-up on chronic medical conditions.  He continues to have difficulty with his back and has had 2 previous surgeries.  His surgeon is recommended that he no longer run to help with that.  He does have BPH as well as elevated PSA.  He has had 2 biopsies done both of which were all negative.  He does have a history of adenomatous colonic polyps and can potentially be rescheduled for another colonoscopy in a couple of years.  He continues on Metformin and having no difficulty with that.  Also taking [place].  Continues on atorvastatin without muscle aches or pains.  He is taking Accupril for his blood pressure. Immunizations and Health Maintenance Immunization History  Administered Date(s) Administered  . Fluad Quad(high Dose 65+) 05/22/2019  . Influenza, High Dose Seasonal PF 07/01/2015, 07/04/2018  . Influenza-Unspecified 07/13/2016  . PFIZER SARS-COV-2 Vaccination 08/29/2019, 09/19/2019  . Pneumococcal Conjugate-13 12/29/2014  . Pneumococcal-Unspecified 07/20/2007  . Tdap 06/21/2016  . Zoster 12/28/2009  . Zoster Recombinat (Shingrix) 12/07/2016, 06/09/2017   Health Maintenance Due  Topic Date Due  . OPHTHALMOLOGY EXAM  05/03/2017    Last colonoscopy: 11/22/17 Last PSA: 10/22/13 Dentist: Q six months Ophtho: yearly  Exercise: walking , biking, and  weights   Other doctors caring for patient include: Dr. Diona Fanti urology, Dr. Carlean Purl GI,   Advanced Directives: Not on file copy asked for Does Patient Have a Medical Advance Directive?: Yes Type of Advance Directive: [place] will Does patient want to make changes to medical advance directive?: No - Patient declined Copy of [place] in Chart?: No - copy requested  Depression screen:  See questionnaire below.     Depression screen [facility] 2/9 01/16/2020 01/15/2019 06/07/2018 05/03/2018 03/08/2017  Decreased Interest 0 0 0  0 0  Down, Depressed, Hopeless 0 0 0 0 0  PHQ - 2 Score 0 0 0 0 0    Fall Screen: See Questionaire below.   Fall Risk  01/16/2020 01/15/2019 06/07/2018 05/03/2018 03/08/2017  Falls in the past year? 0 0 No No No    ADL screen:  See questionnaire below.  Functional Status Survey: Is the patient deaf or have difficulty hearing?: No Does the patient have difficulty seeing, even when wearing glasses/contacts?: No Does the patient have difficulty concentrating, remembering, or making decisions?: No Does the patient have difficulty walking or climbing stairs?: No Does the patient have difficulty dressing or bathing?: No Does the patient have difficulty doing errands alone such as visiting a doctor's office or shopping?: No   Review of Systems  Constitutional: -, -unexpected weight change, -anorexia, -fatigue Allergy: -sneezing, -itching, -congestion Dermatology: denies changing moles, rash, lumps ENT: -runny nose, -ear pain, -sore throat,  Cardiology:  -chest pain, -palpitations, -orthopnea, Respiratory: -cough, -shortness of breath, -dyspnea on exertion, -wheezing,  Gastroenterology: -abdominal pain, -nausea, -vomiting, -diarrhea, -constipation, -dysphagia Hematology: -bleeding or bruising problems Musculoskeletal: -arthralgias, -myalgias, -joint swelling, -back pain, - Ophthalmology: -vision changes,  Urology: -dysuria, -difficulty urinating,  -urinary frequency, -urgency, incontinence Neurology: -, -numbness, , -memory loss, -falls, -dizziness    PHYSICAL EXAM:  BP 122/74   Pulse 83   Temp 98.4 F (36.9 C)   Ht 5' 7.5" (1.715 m)   Wt 162 lb 9.6 oz (73.8 kg)   SpO2 96%   BMI 25.09 kg/m   General Appearance: Alert, cooperative, no distress, appears stated age Head: Normocephalic, without obvious abnormality,  atraumatic Eyes: PERRL, conjunctiva/corneas clear, EOM's intact, fundi benign Ears: Normal TM's and external ear canals Nose: Nares normal, mucosa normal, no drainage  or sinus   tenderness Throat: Lips, mucosa, and tongue normal; teeth and gums normal Neck: Supple, no lymphadenopathy, thyroid:no enlargement/tenderness/nodules; no carotid bruit or JVD Lungs: Clear to auscultation bilaterally without wheezes, rales or ronchi; respirations unlabored Heart: Regular rate and rhythm, S1 and S2 normal, no murmur, rub or gallop Abdomen: Soft, non-tender, nondistended, normoactive bowel sounds, no masses, no hepatosplenomegaly Extremities: No clubbing, cyanosis or edema Pulses: 2+ and symmetric all extremities Skin: Skin color, texture, turgor normal, no rashes or lesions Lymph nodes: Cervical, supraclavicular, and axillary nodes normal Neurologic: CNII-XII intact, normal strength, sensation and gait; reflexes 2+ and symmetric throughout   Psych: Normal mood, affect, hygiene and grooming  ASSESSMENT/PLAN: Benign prostatic hyperplasia without lower urinary tract symptoms  Erectile dysfunction, unspecified erectile dysfunction type  Elevated PSA  Hx of adenomatous polyp of colon  Type 2 diabetes mellitus with complication, without long-term current use of insulin (HCC) - Plan: POCT glycosylated hemoglobin (Hb A1C)  S/P lumbar fusion  Hypertension associated with diabetes (HCC)  Hyperlipidemia associated with type 2 diabetes mellitus (HCC)  Discussed PSA testing in the future especially with his age.  With 2 previous biopsies being negative.  I recommended against further evaluation.  He will consider that.  He will also possibly be reevaluated for colonoscopy and may opt out of that as well.   recommended at least 30 minutes of aerobic activity at least 5 days/week; proper sunscreen use reviewed; healthy diet and alcohol recommendations (less than or equal to 2 drinks/day) reviewed;Marland Kitchen Immunization recommendations discussed.  Colonoscopy recommendations reviewed.   Medicare Attestation I have personally reviewed: The patient's medical and social  history Their use of alcohol, tobacco or illicit drugs Their current medications and supplements The patient's functional ability including ADLs,fall risks, home safety risks, cognitive, and hearing and visual impairment Diet and physical activities Evidence for depression or mood disorders  The patient's weight, height, and BMI have been recorded in the chart.  I have made referrals, counseling, and provided education to the patient based on review of the above and I have provided the patient with a written personalized care plan for preventive services.     Jill Alexanders, MD   01/16/2020

## 2020-01-16 NOTE — Patient Instructions (Signed)
°  Mr. Scott Parks , Thank you for taking time to come for your Medicare Wellness Visit. I appreciate your ongoing commitment to your health goals. Please review the following plan we discussed and let me know if I can assist you in the future.   These are the goals we discussed: Goals   None     This is a list of the screening recommended for you and due dates:  Health Maintenance  Topic Date Due   Eye exam for diabetics  05/03/2017   Flu Shot  03/15/2020   Hemoglobin A1C  07/17/2020   Complete foot exam   09/24/2020   Tetanus Vaccine  06/21/2026   COVID-19 Vaccine  Completed   Pneumonia vaccines  Discontinued

## 2020-01-16 NOTE — Progress Notes (Signed)
Scott Parks is a 79 y.o. male who presents for annual wellness visit and follow-up on chronic medical conditions.  He continues to have difficulty with his back and has had 2 previous surgeries.  His surgeon is recommended that he no longer run to help with that.  He does have BPH as well as elevated PSA.  He has had 2 biopsies done both of which were all negative.  He does have a history of adenomatous colonic polyps and can potentially be rescheduled for another colonoscopy in a couple of years.  He continues on Metformin and having no difficulty with that.  Also taking [place].  Continues on atorvastatin without muscle aches or pains.  He is taking Accupril for his blood pressure. Immunizations and Health Maintenance Immunization History  Administered Date(s) Administered   Fluad Quad(high Dose 65+) 05/22/2019   Influenza, High Dose Seasonal PF 07/01/2015, 07/04/2018   Influenza-Unspecified 07/13/2016   PFIZER SARS-COV-2 Vaccination 08/29/2019, 09/19/2019   Pneumococcal Conjugate-13 12/29/2014   Pneumococcal-Unspecified 07/20/2007   Tdap 06/21/2016   Zoster 12/28/2009   Zoster Recombinat (Shingrix) 12/07/2016, 06/09/2017   Health Maintenance Due  Topic Date Due   OPHTHALMOLOGY EXAM  05/03/2017    Last colonoscopy: 11/22/17 Last PSA: 10/22/13 Dentist: Q six months Ophtho: yearly  Exercise: walking , biking, and  weights   Other doctors caring for patient include: Dr. Diona Fanti urology, Dr. Carlean Purl GI,   Advanced Directives: Not on file copy asked for Does Patient Have a Medical Advance Directive?: Yes Type of Advance Directive: [place] will Does patient want to make changes to medical advance directive?: No - Patient declined Copy of [place] in Chart?: No - copy requested  Depression screen:  See questionnaire below.     Depression screen [facility] 2/9 01/16/2020 01/15/2019 06/07/2018 05/03/2018 03/08/2017  Decreased Interest 0 0 0  0 0  Down, Depressed, Hopeless 0 0 0 0 0  PHQ - 2 Score 0 0 0 0 0    Fall Screen: See Questionaire below.   Fall Risk  01/16/2020 01/15/2019 06/07/2018 05/03/2018 03/08/2017  Falls in the past year? 0 0 No No No    ADL screen:  See questionnaire below.  Functional Status Survey: Is the patient deaf or have difficulty hearing?: No Does the patient have difficulty seeing, even when wearing glasses/contacts?: No Does the patient have difficulty concentrating, remembering, or making decisions?: No Does the patient have difficulty walking or climbing stairs?: No Does the patient have difficulty dressing or bathing?: No Does the patient have difficulty doing errands alone such as visiting a doctor's office or shopping?: No   Review of Systems  Constitutional: -, -unexpected weight change, -anorexia, -fatigue Allergy: -sneezing, -itching, -congestion Dermatology: denies changing moles, rash, lumps ENT: -runny nose, -ear pain, -sore throat,  Cardiology:  -chest pain, -palpitations, -orthopnea, Respiratory: -cough, -shortness of breath, -dyspnea on exertion, -wheezing,  Gastroenterology: -abdominal pain, -nausea, -vomiting, -diarrhea, -constipation, -dysphagia Hematology: -bleeding or bruising problems Musculoskeletal: -arthralgias, -myalgias, -joint swelling, -back pain, - Ophthalmology: -vision changes,  Urology: -dysuria, -difficulty urinating,  -urinary frequency, -urgency, incontinence Neurology: -, -numbness, , -memory loss, -falls, -dizziness    PHYSICAL EXAM:  BP 122/74    Pulse 83    Temp 98.4 F (36.9 C)    Ht 5' 7.5" (1.715 m)    Wt 162 lb 9.6 oz (73.8 kg)    SpO2 96%    BMI 25.09 kg/m   General Appearance: Alert, cooperative, no distress, appears stated  age Head: Normocephalic, without obvious abnormality, atraumatic Eyes: PERRL, conjunctiva/corneas clear, EOM's intact, fundi benign Ears: Normal TM's and external ear canals Nose: Nares normal, mucosa normal, no drainage  or sinus   tenderness Throat: Lips, mucosa, and tongue normal; teeth and gums normal Neck: Supple, no lymphadenopathy, thyroid:no enlargement/tenderness/nodules; no carotid bruit or JVD Lungs: Clear to auscultation bilaterally without wheezes, rales or ronchi; respirations unlabored Heart: Regular rate and rhythm, S1 and S2 normal, no murmur, rub or gallop Abdomen: Soft, non-tender, nondistended, normoactive bowel sounds, no masses, no hepatosplenomegaly Extremities: No clubbing, cyanosis or edema Pulses: 2+ and symmetric all extremities Skin: Skin color, texture, turgor normal, no rashes or lesions Lymph nodes: Cervical, supraclavicular, and axillary nodes normal Neurologic: CNII-XII intact, normal strength, sensation and gait; reflexes 2+ and symmetric throughout   Psych: Normal mood, affect, hygiene and grooming  ASSESSMENT/PLAN: Benign prostatic hyperplasia without lower urinary tract symptoms  Erectile dysfunction, unspecified erectile dysfunction type  Elevated PSA  Hx of adenomatous polyp of colon  Type 2 diabetes mellitus with complication, without long-term current use of insulin (HCC) - Plan: POCT glycosylated hemoglobin (Hb A1C)  S/P lumbar fusion  Hypertension associated with diabetes (HCC)  Hyperlipidemia associated with type 2 diabetes mellitus (HCC)  Discussed PSA testing in the future especially with his age.  With 2 previous biopsies being negative.  I recommended against further evaluation.  He will consider that.  He will also possibly be reevaluated for colonoscopy and may opt out of that as well.   recommended at least 30 minutes of aerobic activity at least 5 days/week; proper sunscreen use reviewed; healthy diet and alcohol recommendations (less than or equal to 2 drinks/day) reviewed;Marland Kitchen Immunization recommendations discussed.  Colonoscopy recommendations reviewed.   Medicare Attestation I have personally reviewed: The patient's medical and social  history Their use of alcohol, tobacco or illicit drugs Their current medications and supplements The patient's functional ability including ADLs,fall risks, home safety risks, cognitive, and hearing and visual impairment Diet and physical activities Evidence for depression or mood disorders  The patient's weight, height, and BMI have been recorded in the chart.  I have made referrals, counseling, and provided education to the patient based on review of the above and I have provided the patient with a written personalized care plan for preventive services.     Jill Alexanders, MD   01/16/2020

## 2020-01-16 NOTE — Progress Notes (Signed)
Scott Parks is a 79 y.o. male who presents for annual wellness visit and follow-up on chronic medical conditions.  He continues to have difficulty with his back and has had 2 previous surgeries.  His surgeon is recommended that he no longer run to help with that.  He does have BPH as well as elevated PSA.  He has had 2 biopsies done both of which were all negative.  He does have a history of adenomatous colonic polyps and can potentially be rescheduled for another colonoscopy in a couple of years.  He continues on Metformin and having no difficulty with that.  Also taking [place].  Continues on atorvastatin without muscle aches or pains.  He is taking Accupril for his blood pressure. Immunizations and Health Maintenance Immunization History  Administered Date(s) Administered  . Fluad Quad(high Dose 65+) 05/22/2019  . Influenza, High Dose Seasonal PF 07/01/2015, 07/04/2018  . Influenza-Unspecified 07/13/2016  . PFIZER SARS-COV-2 Vaccination 08/29/2019, 09/19/2019  . Pneumococcal Conjugate-13 12/29/2014  . Pneumococcal-Unspecified 07/20/2007  . Tdap 06/21/2016  . Zoster 12/28/2009  . Zoster Recombinat (Shingrix) 12/07/2016, 06/09/2017   Health Maintenance Due  Topic Date Due  . OPHTHALMOLOGY EXAM  05/03/2017    Last colonoscopy: 11/22/17 Last PSA: 10/22/13 Dentist: Q six months Ophtho: yearly  Exercise: walking , biking, and  weights   Other doctors caring for patient include: Dr. Diona Fanti urology, Dr. Carlean Purl GI,   Advanced Directives: Not on file copy asked for Does Patient Have a Medical Advance Directive?: Yes Type of Advance Directive: [place] will Does patient want to make changes to medical advance directive?: No - Patient declined Copy of [place] in Chart?: No - copy requested  Depression screen:  See questionnaire below.     Depression screen [facility] 2/9 01/16/2020 01/15/2019 06/07/2018 05/03/2018 03/08/2017  Decreased Interest 0 0 0  0 0  Down, Depressed, Hopeless 0 0 0 0 0  PHQ - 2 Score 0 0 0 0 0    Fall Screen: See Questionaire below.   Fall Risk  01/16/2020 01/15/2019 06/07/2018 05/03/2018 03/08/2017  Falls in the past year? 0 0 No No No    ADL screen:  See questionnaire below.  Functional Status Survey: Is the patient deaf or have difficulty hearing?: No Does the patient have difficulty seeing, even when wearing glasses/contacts?: No Does the patient have difficulty concentrating, remembering, or making decisions?: No Does the patient have difficulty walking or climbing stairs?: No Does the patient have difficulty dressing or bathing?: No Does the patient have difficulty doing errands alone such as visiting a doctor's office or shopping?: No   Review of Systems  Constitutional: -, -unexpected weight change, -anorexia, -fatigue Allergy: -sneezing, -itching, -congestion Dermatology: denies changing moles, rash, lumps ENT: -runny nose, -ear pain, -sore throat,  Cardiology:  -chest pain, -palpitations, -orthopnea, Respiratory: -cough, -shortness of breath, -dyspnea on exertion, -wheezing,  Gastroenterology: -abdominal pain, -nausea, -vomiting, -diarrhea, -constipation, -dysphagia Hematology: -bleeding or bruising problems Musculoskeletal: -arthralgias, -myalgias, -joint swelling, -back pain, - Ophthalmology: -vision changes,  Urology: -dysuria, -difficulty urinating,  -urinary frequency, -urgency, incontinence Neurology: -, -numbness, , -memory loss, -falls, -dizziness    PHYSICAL EXAM:  BP 122/74   Pulse 83   Temp 98.4 F (36.9 C)   Ht 5' 7.5" (1.715 m)   Wt 162 lb 9.6 oz (73.8 kg)   SpO2 96%   BMI 25.09 kg/m   General Appearance: Alert, cooperative, no distress, appears stated age Head: Normocephalic, without obvious abnormality,  atraumatic Eyes: PERRL, conjunctiva/corneas clear, EOM's intact, fundi benign Ears: Normal TM's and external ear canals Nose: Nares normal, mucosa normal, no drainage  or sinus   tenderness Throat: Lips, mucosa, and tongue normal; teeth and gums normal Neck: Supple, no lymphadenopathy, thyroid:no enlargement/tenderness/nodules; no carotid bruit or JVD Lungs: Clear to auscultation bilaterally without wheezes, rales or ronchi; respirations unlabored Heart: Regular rate and rhythm, S1 and S2 normal, no murmur, rub or gallop Abdomen: Soft, non-tender, nondistended, normoactive bowel sounds, no masses, no hepatosplenomegaly Extremities: No clubbing, cyanosis or edema Pulses: 2+ and symmetric all extremities Skin: Skin color, texture, turgor normal, no rashes or lesions Lymph nodes: Cervical, supraclavicular, and axillary nodes normal Neurologic: CNII-XII intact, normal strength, sensation and gait; reflexes 2+ and symmetric throughout   Psych: Normal mood, affect, hygiene and grooming  ASSESSMENT/PLAN: Benign prostatic hyperplasia without lower urinary tract symptoms  Erectile dysfunction, unspecified erectile dysfunction type  Elevated PSA  Hx of adenomatous polyp of colon  Type 2 diabetes mellitus with complication, without long-term current use of insulin (HCC) - Plan: POCT glycosylated hemoglobin (Hb A1C)  S/P lumbar fusion  Hypertension associated with diabetes (HCC)  Hyperlipidemia associated with type 2 diabetes mellitus (HCC)  Discussed PSA testing in the future especially with his age.  With 2 previous biopsies being negative.  I recommended against further evaluation.  He will consider that.  He will also possibly be reevaluated for colonoscopy and may opt out of that as well.   recommended at least 30 minutes of aerobic activity at least 5 days/week; proper sunscreen use reviewed; healthy diet and alcohol recommendations (less than or equal to 2 drinks/day) reviewed;[place] Kitchen Immunization recommendations discussed.  Colonoscopy recommendations reviewed.   Medicare Attestation I have personally reviewed: The patient's medical and social  history Their use of alcohol, tobacco or illicit drugs Their current medications and supplements The patient's functional ability including ADLs,fall risks, home safety risks, cognitive, and hearing and visual impairment Diet and physical activities Evidence for depression or mood disorders  The patient's weight, height, and BMI have been recorded in the chart.  I have made referrals, counseling, and provided education to the patient based on review of the above and I have provided the patient with a written personalized care plan for preventive services.     Jill Alexanders, MD   01/16/2020

## 2020-01-22 ENCOUNTER — Telehealth: Payer: Self-pay | Admitting: Family Medicine

## 2020-01-22 NOTE — Telephone Encounter (Signed)
Received requested records from [facility]

## 2020-02-04 ENCOUNTER — Encounter: Payer: Self-pay | Admitting: Family Medicine

## 2020-02-08 ENCOUNTER — Other Ambulatory Visit: Payer: Self-pay | Admitting: Family Medicine

## 2020-02-08 DIAGNOSIS — E1159 Type 2 diabetes mellitus with other circulatory complications: Secondary | ICD-10-CM

## 2020-02-08 DIAGNOSIS — E118 Type 2 diabetes mellitus with unspecified complications: Secondary | ICD-10-CM

## 2020-03-12 IMAGING — MR MR PROSTATE WO/W CM
56 series · 56 of 56 positions shown · IV contrast (multihance)
Comparison: None

CLINICAL DATA: Elevated PSA equal 11.2 on 01/12/2018. Prostate
biopsy 08/15/2007 with benign prostate tissue.

EXAM:
MR PROSTATE WITHOUT AND WITH CONTRAST
TECHNIQUE: Multiplanar multisequence MRI images were obtained of the pelvis
centered about the prostate. Pre and post contrast images were
obtained.
CONTRAST:  15mL MULTIHANCE GADOBENATE DIMEGLUMINE 529 MG/ML IV SOLN
Creatinine was obtained on site at [HOSPITAL] at [HOSPITAL].
Results: Creatinine 0.9 mg/dL.

[Series 3: T1 · axial · 8.0mm · 1.06mm/px · 1 of 28 slices shown (1 of 2)]
[im 1/28]
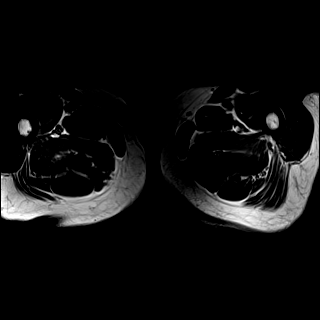

[Series 4: bSSFP fat-sat · axial · 8.0mm · 0.74mm/px · 1 of 28 slices shown]
[im 1/28]
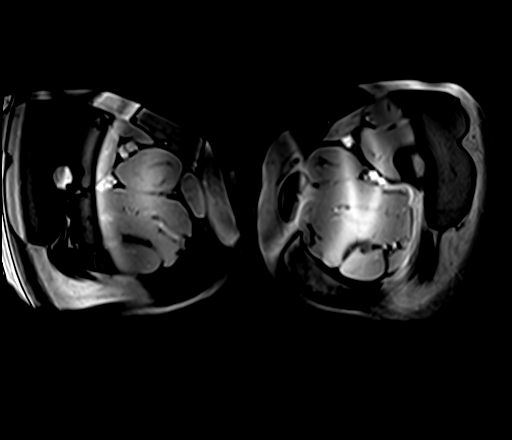

[Series 5: T2 · sagittal · 3.5mm · 0.56mm/px · 1 of 39 slices shown (1 of 4)]
[im 1/39]
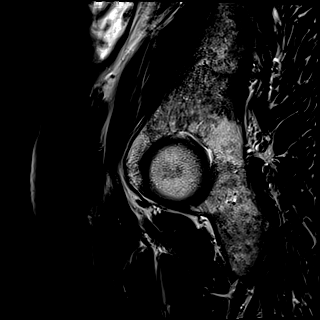

[Series 6: T1 · axial · 3.0mm · 0.31mm/px · 1 of 26 slices shown (2 of 2)]
[im 1/26]
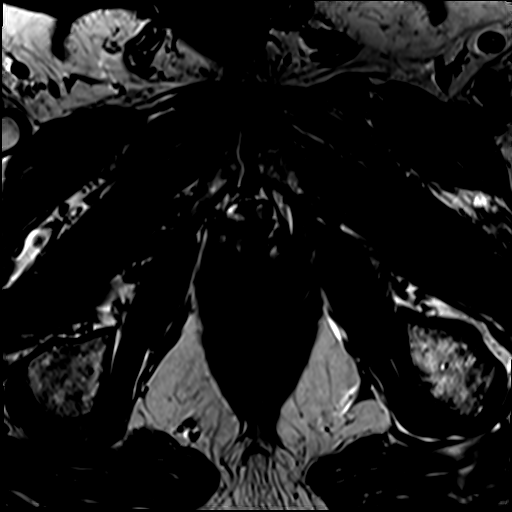

[Series 7: T2 · axial · 3.5mm · 0.56mm/px · 1 of 23 slices shown (2 of 4)]
[im 1/23]
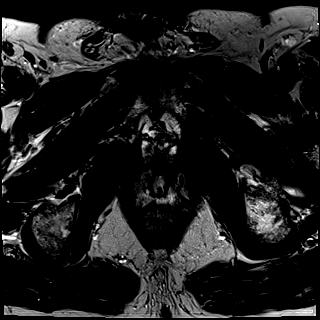

[Series 8: T2 · axial · 1.0mm · 1.04mm/px · 1 of 80 slices shown (3 of 4)]
[im 1/80]
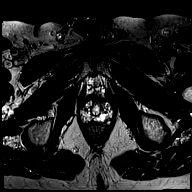

[Series 9: T2 · coronal · 3.5mm · 0.56mm/px · 1 of 23 slices shown (4 of 4)]
[im 1/23]
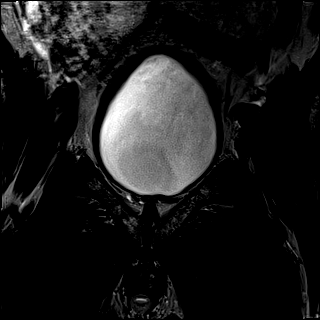

[Series 10: DWI · axial · 3.5mm · 1.56mm/px · 1 of 72 slices shown (1 of 2)]
[im 1/72]
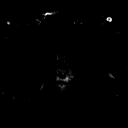

[Series 11: DWI · axial · 3.5mm · 1.56mm/px · 1 of 24 slices shown (2 of 2)]
[im 1/24]
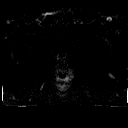

[Series 12: pre t1_twist_tra_dyn_ttc=6.4s · axial · non-contrast · 3.5mm · 0.83mm/px · 1 of 24 slices shown]
[im 1/24]
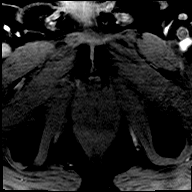

[Series 13: post t1_twist_tra_dyn-copy center · axial · 3.5mm · 0.83mm/px · 1 of 24 slices shown (1 of 24)]
[im 1/24]
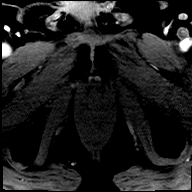

[Series 14: post t1_twist_tra_dyn-copy center · axial · 3.5mm · 0.83mm/px · 1 of 24 slices shown (2 of 24)]
[im 1/24]
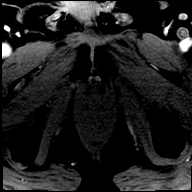

[Series 15: post t1_twist_tra_dyn-copy cent_sub_ttc=28.4s · axial · 3.5mm · 0.83mm/px · 1 of 23 slices shown]
[im 1/23]
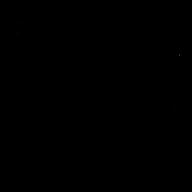

[Series 16: post t1_twist_tra_dyn-copy center · axial · 3.5mm · 0.83mm/px · 1 of 24 slices shown (3 of 24)]
[im 1/24]
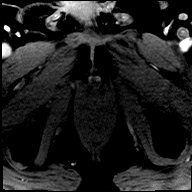

[Series 17: post t1_twist_tra_dyn-copy cent_sub_ttc=41.2s · axial · 3.5mm · 0.83mm/px · 1 of 24 slices shown]
[im 1/24]
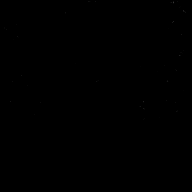

[Series 18: post t1_twist_tra_dyn-copy center · axial · 3.5mm · 0.83mm/px · 1 of 24 slices shown (4 of 24)]
[im 1/24]
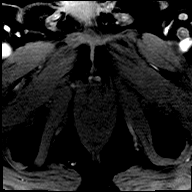

[Series 19: post t1_twist_tra_dyn-copy cent_sub_ttc=54.0s · axial · 3.5mm · 0.83mm/px · 1 of 24 slices shown]
[im 1/24]
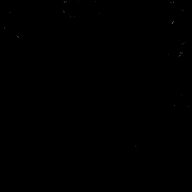

[Series 20: post t1_twist_tra_dyn-copy center · axial · 3.5mm · 0.83mm/px · 1 of 24 slices shown (5 of 24)]
[im 1/24]
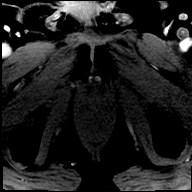

[Series 21: post t1_twist_tra_dyn-copy cent_sub_ttc=66.8s · axial · 3.5mm · 0.83mm/px · 1 of 24 slices shown]
[im 1/24]
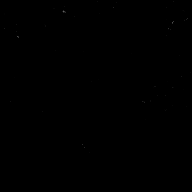

[Series 22: post t1_twist_tra_dyn-copy center · axial · 3.5mm · 0.83mm/px · 1 of 24 slices shown (6 of 24)]
[im 1/24]
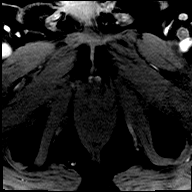

[Series 23: post t1_twist_tra_dyn-copy cent_sub_ttc=79.6s · axial · 3.5mm · 0.83mm/px · 1 of 24 slices shown]
[im 1/24]
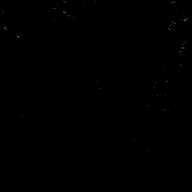

[Series 24: post t1_twist_tra_dyn-copy center · axial · 3.5mm · 0.83mm/px · 1 of 24 slices shown (7 of 24)]
[im 1/24]
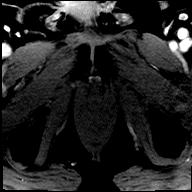

[Series 25: post t1_twist_tra_dyn-copy cent_sub_ttc=92.4s · axial · 3.5mm · 0.83mm/px · 1 of 24 slices shown]
[im 1/24]
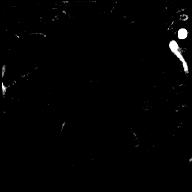

[Series 26: post t1_twist_tra_dyn-copy center · axial · 3.5mm · 0.83mm/px · 1 of 24 slices shown (8 of 24)]
[im 1/24]
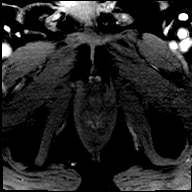

[Series 27: post t1_twist_tra_dyn-copy cent_sub_ttc=105.2s · axial · 3.5mm · 0.83mm/px · 1 of 24 slices shown]
[im 1/24]
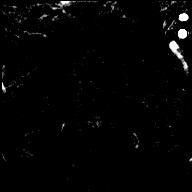

[Series 28: post t1_twist_tra_dyn-copy center · axial · 3.5mm · 0.83mm/px · 1 of 24 slices shown (9 of 24)]
[im 1/24]
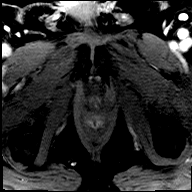

[Series 29: post t1_twist_tra_dyn-copy cent_sub_ttc=117.9s · axial · 3.5mm · 0.83mm/px · 1 of 24 slices shown]
[im 1/24]
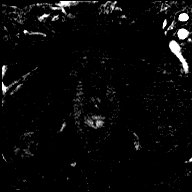

[Series 30: post t1_twist_tra_dyn-copy center · axial · 3.5mm · 0.83mm/px · 1 of 24 slices shown (10 of 24)]
[im 1/24]
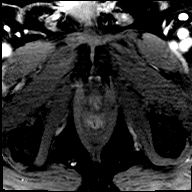

[Series 31: post t1_twist_tra_dyn-copy cent_sub_ttc=130.7s · axial · 3.5mm · 0.83mm/px · 1 of 24 slices shown]
[im 1/24]
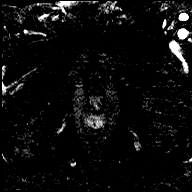

[Series 32: post t1_twist_tra_dyn-copy center · axial · 3.5mm · 0.83mm/px · 1 of 24 slices shown (11 of 24)]
[im 1/24]
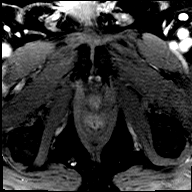

[Series 33: post t1_twist_tra_dyn-copy cent_sub_ttc=143.5s · axial · 3.5mm · 0.83mm/px · 1 of 24 slices shown]
[im 1/24]
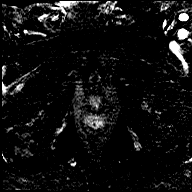

[Series 34: post t1_twist_tra_dyn-copy center · axial · 3.5mm · 0.83mm/px · 1 of 24 slices shown (12 of 24)]
[im 1/24]
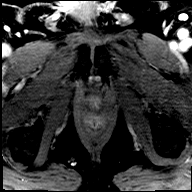

[Series 35: post t1_twist_tra_dyn-copy cent_sub_ttc=156.3s · axial · 3.5mm · 0.83mm/px · 1 of 24 slices shown]
[im 1/24]
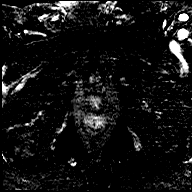

[Series 36: post t1_twist_tra_dyn-copy center · axial · 3.5mm · 0.83mm/px · 1 of 24 slices shown (13 of 24)]
[im 1/24]
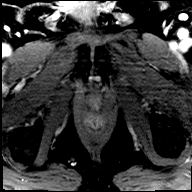

[Series 37: post t1_twist_tra_dyn-copy cent_sub_ttc=169.1s · axial · 3.5mm · 0.83mm/px · 1 of 24 slices shown]
[im 1/24]
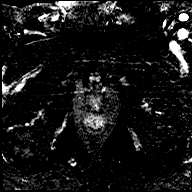

[Series 38: post t1_twist_tra_dyn-copy center · axial · 3.5mm · 0.83mm/px · 1 of 24 slices shown (14 of 24)]
[im 1/24]
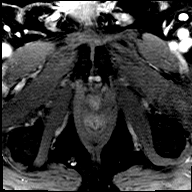

[Series 39: post t1_twist_tra_dyn-copy cent_sub_ttc=181.9s · axial · 3.5mm · 0.83mm/px · 1 of 24 slices shown]
[im 1/24]
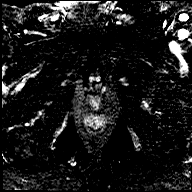

[Series 40: post t1_twist_tra_dyn-copy center · axial · 3.5mm · 0.83mm/px · 1 of 24 slices shown (15 of 24)]
[im 1/24]
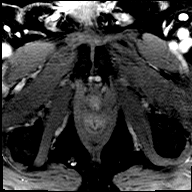

[Series 41: post t1_twist_tra_dyn-copy cent_sub_ttc=194.7s · axial · 3.5mm · 0.83mm/px · 1 of 24 slices shown]
[im 1/24]
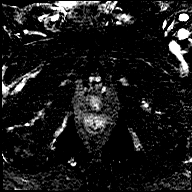

[Series 42: post t1_twist_tra_dyn-copy center · axial · 3.5mm · 0.83mm/px · 1 of 24 slices shown (16 of 24)]
[im 1/24]
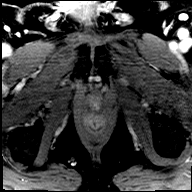

[Series 43: post t1_twist_tra_dyn-copy cent_sub_ttc=207.5s · axial · 3.5mm · 0.83mm/px · 1 of 24 slices shown]
[im 1/24]
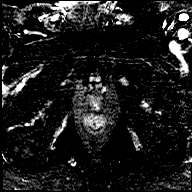

[Series 44: post t1_twist_tra_dyn-copy center · axial · 3.5mm · 0.83mm/px · 1 of 24 slices shown (17 of 24)]
[im 1/24]
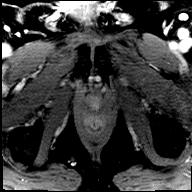

[Series 45: post t1_twist_tra_dyn-copy cent_sub_ttc=220.3s · axial · 3.5mm · 0.83mm/px · 1 of 24 slices shown]
[im 1/24]
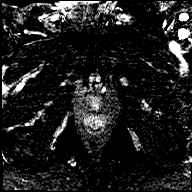

[Series 46: post t1_twist_tra_dyn-copy center · axial · 3.5mm · 0.83mm/px · 1 of 24 slices shown (18 of 24)]
[im 1/24]
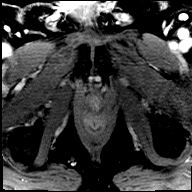

[Series 47: post t1_twist_tra_dyn-copy cent_sub_ttc=233.1s · axial · 3.5mm · 0.83mm/px · 1 of 24 slices shown]
[im 1/24]
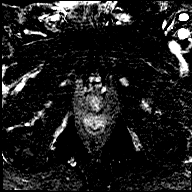

[Series 48: post t1_twist_tra_dyn-copy center · axial · 3.5mm · 0.83mm/px · 1 of 24 slices shown (19 of 24)]
[im 1/24]
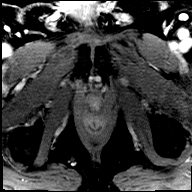

[Series 49: post t1_twist_tra_dyn-copy cent_sub_ttc=245.9s · axial · 3.5mm · 0.83mm/px · 1 of 24 slices shown]
[im 1/24]
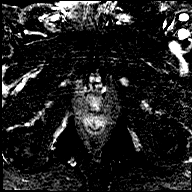

[Series 50: post t1_twist_tra_dyn-copy center · axial · 3.5mm · 0.83mm/px · 1 of 24 slices shown (20 of 24)]
[im 1/24]
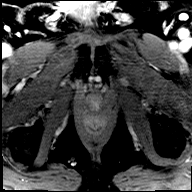

[Series 51: post t1_twist_tra_dyn-copy cent_sub_ttc=258.6s · axial · 3.5mm · 0.83mm/px · 1 of 24 slices shown]
[im 1/24]
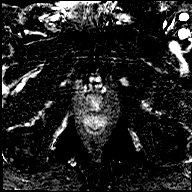

[Series 52: post t1_twist_tra_dyn-copy center · axial · 3.5mm · 0.83mm/px · 1 of 24 slices shown (21 of 24)]
[im 1/24]
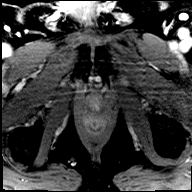

[Series 53: post t1_twist_tra_dyn-copy cent_sub_ttc=271.4s · axial · 3.5mm · 0.83mm/px · 1 of 24 slices shown]
[im 1/24]
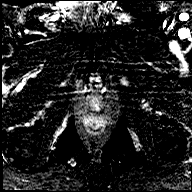

[Series 54: post t1_twist_tra_dyn-copy center · axial · 3.5mm · 0.83mm/px · 1 of 24 slices shown (22 of 24)]
[im 1/24]
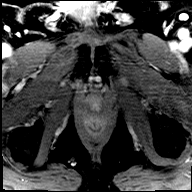

[Series 55: post t1_twist_tra_dyn-copy cent_sub_ttc=284.2s · axial · 3.5mm · 0.83mm/px · 1 of 24 slices shown]
[im 1/24]
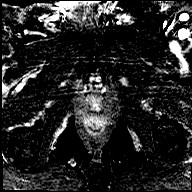

[Series 56: post t1_twist_tra_dyn-copy center · axial · 3.5mm · 0.83mm/px · 1 of 24 slices shown (23 of 24)]
[im 1/24]
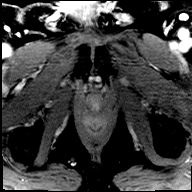

[Series 57: post t1_twist_tra_dyn-copy cent_sub_ttc=297.0s · axial · 3.5mm · 0.83mm/px · 1 of 24 slices shown]
[im 1/24]
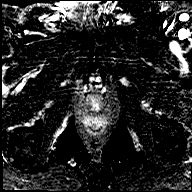

[Series 58: post t1_twist_tra_dyn-copy center · axial · 3.5mm · 0.83mm/px · 1 of 24 slices shown (24 of 24)]
[im 1/24]
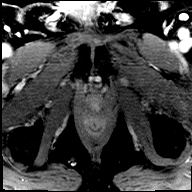

[56 of 56 positions shown; findings below may reference images not displayed]

FINDINGS: Prostate: The peripheral zone is thinned by the enlarged
transitional zone. Normal high signal intensity within the
peripheral zone on T2 weighted imaging. No foci of restricted
diffusion in the peripheral zone.

The transitional zone is enlarged and nodular. There are multiple
well capsulated nodules without suspicious findings. Prostatic
capsule is intact. Seminal vesicles are diminutive.

Volume: 6.9 by 5.8 by 7.3 cm (volume = 150 cm^3)

Transcapsular spread:  Absent

Seminal vesicle involvement: Absent

Neurovascular bundle involvement: Absent

Pelvic adenopathy: Absent

Bone metastasis: Absent

Other findings: Heterogeneous marrow signal.
IMPRESSION: 1. No evidence of high-grade carcinoma within the peripheral zone
(PI RADS 1).
2. Enlarged nodular transitional zone most consistent with benign
prostate hypertrophy.
3. Heterogeneous marrow signal may relate to diabetes or anemia.

## 2020-05-11 DIAGNOSIS — Z23 Encounter for immunization: Secondary | ICD-10-CM | POA: Diagnosis not present

## 2020-05-18 ENCOUNTER — Encounter: Payer: Self-pay | Admitting: Family Medicine

## 2020-05-18 ENCOUNTER — Other Ambulatory Visit: Payer: Self-pay

## 2020-05-18 ENCOUNTER — Ambulatory Visit (INDEPENDENT_AMBULATORY_CARE_PROVIDER_SITE_OTHER): Payer: Medicare Other | Admitting: Family Medicine

## 2020-05-18 VITALS — BP 132/80 | HR 76 | Temp 96.7°F | Ht 68.5 in | Wt 166.0 lb

## 2020-05-18 DIAGNOSIS — Z1159 Encounter for screening for other viral diseases: Secondary | ICD-10-CM | POA: Diagnosis not present

## 2020-05-18 DIAGNOSIS — M5416 Radiculopathy, lumbar region: Secondary | ICD-10-CM

## 2020-05-18 DIAGNOSIS — Z981 Arthrodesis status: Secondary | ICD-10-CM | POA: Diagnosis not present

## 2020-05-18 DIAGNOSIS — I10 Essential (primary) hypertension: Secondary | ICD-10-CM

## 2020-05-18 DIAGNOSIS — E1169 Type 2 diabetes mellitus with other specified complication: Secondary | ICD-10-CM | POA: Diagnosis not present

## 2020-05-18 DIAGNOSIS — S76311A Strain of muscle, fascia and tendon of the posterior muscle group at thigh level, right thigh, initial encounter: Secondary | ICD-10-CM | POA: Diagnosis not present

## 2020-05-18 DIAGNOSIS — E1159 Type 2 diabetes mellitus with other circulatory complications: Secondary | ICD-10-CM | POA: Diagnosis not present

## 2020-05-18 DIAGNOSIS — N529 Male erectile dysfunction, unspecified: Secondary | ICD-10-CM | POA: Diagnosis not present

## 2020-05-18 DIAGNOSIS — E118 Type 2 diabetes mellitus with unspecified complications: Secondary | ICD-10-CM

## 2020-05-18 DIAGNOSIS — R972 Elevated prostate specific antigen [PSA]: Secondary | ICD-10-CM

## 2020-05-18 DIAGNOSIS — E785 Hyperlipidemia, unspecified: Secondary | ICD-10-CM

## 2020-05-18 DIAGNOSIS — I152 Hypertension secondary to endocrine disorders: Secondary | ICD-10-CM

## 2020-05-18 LAB — POCT UA - MICROALBUMIN
Albumin/Creatinine Ratio, Urine, POC: 8.7
Creatinine, POC: 84.9 mg/dL
Microalbumin Ur, POC: 7.4 mg/L

## 2020-05-18 LAB — POCT GLYCOSYLATED HEMOGLOBIN (HGB A1C): Hemoglobin A1C: 7.5 % — AB (ref 4.0–5.6)

## 2020-05-18 MED ORDER — METFORMIN HCL 1000 MG PO TABS: 1000.0000 mg | ORAL_TABLET | Freq: Two times a day (BID) | ORAL | 1 refills | Status: DC

## 2020-05-18 MED ORDER — DAPAGLIFLOZIN PROPANEDIOL 5 MG PO TABS: 5.0000 mg | ORAL_TABLET | Freq: Every day | ORAL | 1 refills | Status: DC

## 2020-05-18 MED ORDER — QUINAPRIL HCL 40 MG PO TABS: 40.0000 mg | ORAL_TABLET | Freq: Every day | ORAL | 3 refills | Status: DC

## 2020-05-18 MED ORDER — ATORVASTATIN CALCIUM 40 MG PO TABS: 40.0000 mg | ORAL_TABLET | Freq: Every day | ORAL | 3 refills | Status: DC

## 2020-05-18 NOTE — Progress Notes (Signed)
Subjective:    Patient ID: Scott Parks, male    DOB: [birth year], 79 y.o.   MRN: [id number]  Scott Parks is a 79 y.o. male who presents for follow-up of Type 2 diabetes mellitus.  Home blood sugar records: meter records, fasting and post meal , 112-195 Current symptoms/problems include none at this time. Daily foot checks: yes   Any foot concerns: none  Exercise: walking daily  Diet:reg He continues on Farxiga and Metformin and is having no difficulty with that.  Atorvastatin causes no aches or pains.  He is also taking quinapril.  He does exercise fairly regularly.  Recently injured his hamstring and is taking care of this on his own.  He states it is getting better.  He also has a history of BPH and elevated PSA but at this point he is not interested in pursuing this further.  He also has a history of ED but is fine with not being on any medication at the present time.  He has had back surgery and still has some residual numbness to the lateral patellar area.  He has on occasion problem with no good reason. The following portions of the patient's history were reviewed and updated as appropriate: allergies, current medications, past medical history, past social history and problem list.  ROS as in subjective above.     Objective:    Physical Exam Alert and in no distress .  Foot exam is normal.  Cardiac exam shows regular rhythm without murmurs or gallops.  Lungs clear to auscultation. No palpable tenderness noted to the posterior right thigh area. Hemoglobin A1c is 7.5 Lab Review Diabetic Labs Latest Ref Rng & Units 01/16/2020 09/25/2019 05/22/2019 01/15/2019 11/22/2018  HbA1c 4.0 - 5.6 % 7.5(A) 7.9(A) 8.3(A) 8.4(A) 8.5(H)  Microalbumin mg/L - - 10.4 - -  Micro/Creat Ratio - - - 6.1 - -  Chol 100 - 199 mg/dL - - 148 - -  HDL >39 mg/dL - - 72 - -  Calc LDL 0 - 99 mg/dL - - 64 - -  Triglycerides 0 - 149 mg/dL - - 58 - -  Creatinine 0.76 - 1.27 mg/dL - - 1.10 - 1.09   BP/Weight 01/16/2020  09/25/2019 05/22/2019 01/15/2019 04/12/5620  Systolic BP 308 657 846 962 98  Diastolic BP 74 72 82 84 66  Wt. (Lbs) 162.6 167.4 171.4 172.2 -  BMI 25.09 24.72 25.31 25.43 -   Foot/eye exam completion dates Latest Ref Rng & Units 09/25/2019 06/06/2019  Eye Exam No Retinopathy - No Retinopathy  Foot Form Completion - Done -    Scott Parks  reports that he quit smoking about 35 years ago. His smoking use included cigarettes. He has never used smokeless tobacco. He reports current alcohol use. He reports that he does not use drugs.     Assessment & Plan:    Type 2 diabetes mellitus with complication, without long-term current use of insulin (HCC) - Plan: CBC with Differential/Platelet, Comprehensive metabolic panel, Lipid panel, POCT glycosylated hemoglobin (Hb A1C), POCT UA - Microalbumin, dapagliflozin propanediol (FARXIGA) 5 MG TABS tablet, metFORMIN (GLUCOPHAGE) 1000 MG tablet  Erectile dysfunction, unspecified erectile dysfunction type  Elevated PSA  Hyperlipidemia associated with type 2 diabetes mellitus ([place]) - Plan: Lipid panel, atorvastatin (LIPITOR) 40 MG tablet  Essential hypertension - Plan: CBC with Differential/Platelet, Comprehensive metabolic panel, quinapril (ACCUPRIL) 40 MG tablet  S/P lumbar spinal fusion  Lumbar radiculopathy  Need for hepatitis C screening test - Plan: Hepatitis C antibody  Hypertension  associated with diabetes ([place]) - Plan: dapagliflozin propanediol (FARXIGA) 5 MG TABS tablet  Strain of right hamstring, initial encounter   1. Rx changes: none 2. Education: Reviewed 'ABCs' of diabetes management (respective goals in parentheses):  A1C (<7), blood pressure (<130/80), and cholesterol (LDL <100). 3. Compliance at present is estimated to be good. Efforts to improve compliance (if necessary) will be directed at No change. 4. Follow up: 4 months He will continue to treat the hamstring injury conservatively.  He is comfortable with not pursuing the PSA or the  ED issue.  Discussed the occasional trouble he is having with the leg and unless he has more difficulty with falls, further intervention will not be pursued.

## 2020-05-19 LAB — CBC WITH DIFFERENTIAL/PLATELET
Basophils Absolute: 0.1 10*3/uL (ref 0.0–0.2)
Basos: 1 %
EOS (ABSOLUTE): 0.2 10*3/uL (ref 0.0–0.4)
Eos: 4 %
Hematocrit: 44.1 % (ref 37.5–51.0)
Hemoglobin: 14.2 g/dL (ref 13.0–17.7)
Immature Grans (Abs): 0 10*3/uL (ref 0.0–0.1)
Immature Granulocytes: 1 %
Lymphocytes Absolute: 1.9 10*3/uL (ref 0.7–3.1)
Lymphs: 30 %
MCH: 26.2 pg — ABNORMAL LOW (ref 26.6–33.0)
MCHC: 32.2 g/dL (ref 31.5–35.7)
MCV: 81 fL (ref 79–97)
Monocytes Absolute: 0.5 10*3/uL (ref 0.1–0.9)
Monocytes: 8 %
Neutrophils Absolute: 3.7 10*3/uL (ref 1.4–7.0)
Neutrophils: 56 %
Platelets: 220 10*3/uL (ref 150–450)
RBC: 5.43 x10E6/uL (ref 4.14–5.80)
RDW: 13.9 % (ref 11.6–15.4)
WBC: 6.4 10*3/uL (ref 3.4–10.8)

## 2020-05-19 LAB — COMPREHENSIVE METABOLIC PANEL
ALT: 19 IU/L (ref 0–44)
AST: 19 IU/L (ref 0–40)
Albumin/Globulin Ratio: 1.9 (ref 1.2–2.2)
Albumin: 4.6 g/dL (ref 3.7–4.7)
Alkaline Phosphatase: 119 IU/L (ref 44–121)
BUN/Creatinine Ratio: 13 (ref 10–24)
BUN: 12 mg/dL (ref 8–27)
Bilirubin Total: 0.5 mg/dL (ref 0.0–1.2)
CO2: 26 mmol/L (ref 20–29)
Calcium: 9.9 mg/dL (ref 8.6–10.2)
Chloride: 100 mmol/L (ref 96–106)
Creatinine, Ser: 0.95 mg/dL (ref 0.76–1.27)
GFR calc Af Amer: 89 mL/min/{1.73_m2} (ref >59)
GFR calc non Af Amer: 77 mL/min/{1.73_m2} (ref >59)
Globulin, Total: 2.4 g/dL (ref 1.5–4.5)
Glucose: 146 mg/dL — ABNORMAL HIGH (ref 65–99)
Potassium: 4.9 mmol/L (ref 3.5–5.2)
Sodium: 138 mmol/L (ref 134–144)
Total Protein: 7 g/dL (ref 6.0–8.5)

## 2020-05-19 LAB — HEPATITIS C ANTIBODY: Hep C Virus Ab: <0.1 s/co ratio (ref 0.0–0.9)

## 2020-05-19 LAB — LIPID PANEL
Chol/HDL Ratio: 2.2 ratio (ref 0.0–5.0)
Cholesterol, Total: 175 mg/dL (ref 100–199)
HDL: 80 mg/dL (ref >39)
LDL Chol Calc (NIH): 83 mg/dL (ref 0–99)
Triglycerides: 61 mg/dL (ref 0–149)
VLDL Cholesterol Cal: 12 mg/dL (ref 5–40)

## 2020-06-11 DIAGNOSIS — E119 Type 2 diabetes mellitus without complications: Secondary | ICD-10-CM | POA: Diagnosis not present

## 2020-10-15 DIAGNOSIS — R972 Elevated prostate specific antigen [PSA]: Secondary | ICD-10-CM | POA: Diagnosis not present

## 2020-10-23 DIAGNOSIS — R972 Elevated prostate specific antigen [PSA]: Secondary | ICD-10-CM | POA: Diagnosis not present

## 2020-10-23 DIAGNOSIS — N5201 Erectile dysfunction due to arterial insufficiency: Secondary | ICD-10-CM | POA: Diagnosis not present

## 2020-10-23 DIAGNOSIS — R6882 Decreased libido: Secondary | ICD-10-CM | POA: Diagnosis not present

## 2020-11-15 NOTE — Progress Notes (Signed)
  Subjective:    Patient ID: Scott Parks, male    DOB: July 07, 1941, 80 y.o.   MRN: [id number]  Scott Parks is a 80 y.o. male who presents for follow-up of Type 2 diabetes mellitus.  Home blood sugar records: meter records , fasting and post meal, 106 - 218 Current symptoms/problems include none at this time. Daily foot checks:yes   Any foot concerns:none Exercise: staying active 20 min a day Diet: good He continues on [place] and Metformin and is having no difficulty with that.  Atorvastatin is causing no aches and pains.  He is on quinapril for his blood pressure.  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  He has no other concerns or complaints. The following portions of the patient's history were reviewed and updated as appropriate: allergies, current medications, past medical history, past social history and problem list.  ROS as in subjective above.     Objective:    Physical Exam Alert and in no distress foot exam done  Hemoglobin A1c is 7.6  Lab Review Diabetic Labs Latest Ref Rng & Units 11/16/2020 05/18/2020 01/16/2020 09/25/2019 05/22/2019  HbA1c 4.0 - 5.6 % 7.6(A) 7.5(A) 7.5(A) 7.9(A) 8.3(A)  Microalbumin mg/L - 7.4 - - 10.4  Micro/Creat Ratio - - 8.7 - - 6.1  Chol 100 - 199 mg/dL - 175 - - 148  HDL >39 mg/dL - 80 - - 72  Calc LDL 0 - 99 mg/dL - 83 - - 64  Triglycerides 0 - 149 mg/dL - 61 - - 58  Creatinine 0.76 - 1.27 mg/dL - 0.95 - - 1.10   BP/Weight 11/16/2020 05/18/2020 01/16/2020 09/25/2019 73/02/1061  Systolic BP 694 854 627 035 009  Diastolic BP 76 80 74 72 82  Wt. (Lbs) 171.2 166 162.6 167.4 171.4  BMI 25.65 24.87 25.09 24.72 25.31   Foot/eye exam completion dates Latest Ref Rng & Units 11/16/2020 09/25/2019  Eye Exam No Retinopathy - -  Foot Form Completion - Done Done    Scott Parks  reports that he quit smoking about 36 years ago. His smoking use included cigarettes. He has never used smokeless tobacco. He reports current alcohol use. He  reports that he does not use drugs.     Assessment & Plan:    Type 2 diabetes mellitus with complication, without long-term current use of insulin (HCC) - Plan: POCT glycosylated hemoglobin (Hb A1C)  Immunization, viral disease - Plan: PFIZER Comirnaty(GRAY TOP)COVID-19 Vaccine  Hyperlipidemia associated with type 2 diabetes mellitus ([place])  Hypertension associated with diabetes ([place])  1. Rx changes: none 2. Education: Reviewed 'ABCs' of diabetes management (respective goals in parentheses):  A1C (<7), blood pressure (<130/80), and cholesterol (LDL <100). 3. Compliance at present is estimated to be good. Efforts to improve compliance (if necessary) will be directed at continuing of present medication regimen follow up: 4 months

## 2020-11-16 ENCOUNTER — Ambulatory Visit (INDEPENDENT_AMBULATORY_CARE_PROVIDER_SITE_OTHER): Payer: Medicare Other | Admitting: Family Medicine

## 2020-11-16 ENCOUNTER — Encounter: Payer: Self-pay | Admitting: Family Medicine

## 2020-11-16 ENCOUNTER — Other Ambulatory Visit: Payer: Self-pay

## 2020-11-16 VITALS — BP 120/76 | HR 64 | Temp 97.0°F | Wt 171.2 lb

## 2020-11-16 DIAGNOSIS — E1169 Type 2 diabetes mellitus with other specified complication: Secondary | ICD-10-CM | POA: Diagnosis not present

## 2020-11-16 DIAGNOSIS — I152 Hypertension secondary to endocrine disorders: Secondary | ICD-10-CM | POA: Diagnosis not present

## 2020-11-16 DIAGNOSIS — E785 Hyperlipidemia, unspecified: Secondary | ICD-10-CM | POA: Diagnosis not present

## 2020-11-16 DIAGNOSIS — E118 Type 2 diabetes mellitus with unspecified complications: Secondary | ICD-10-CM | POA: Diagnosis not present

## 2020-11-16 DIAGNOSIS — E1159 Type 2 diabetes mellitus with other circulatory complications: Secondary | ICD-10-CM

## 2020-11-16 DIAGNOSIS — Z23 Encounter for immunization: Secondary | ICD-10-CM

## 2020-11-16 LAB — POCT GLYCOSYLATED HEMOGLOBIN (HGB A1C): Hemoglobin A1C: 7.6 % — AB (ref 4.0–5.6)

## 2020-12-01 ENCOUNTER — Other Ambulatory Visit: Payer: Self-pay | Admitting: Family Medicine

## 2020-12-01 DIAGNOSIS — E1159 Type 2 diabetes mellitus with other circulatory complications: Secondary | ICD-10-CM

## 2020-12-01 DIAGNOSIS — I152 Hypertension secondary to endocrine disorders: Secondary | ICD-10-CM

## 2020-12-01 DIAGNOSIS — E118 Type 2 diabetes mellitus with unspecified complications: Secondary | ICD-10-CM

## 2021-01-02 IMAGING — CR CHEST - 2 VIEW
2 series · 2 of 2 positions shown · non-contrast
Comparison: 03/30/2016

CLINICAL DATA: Spondylolisthesis, preop

EXAM:
CHEST - 2 VIEW

[w chest pa]
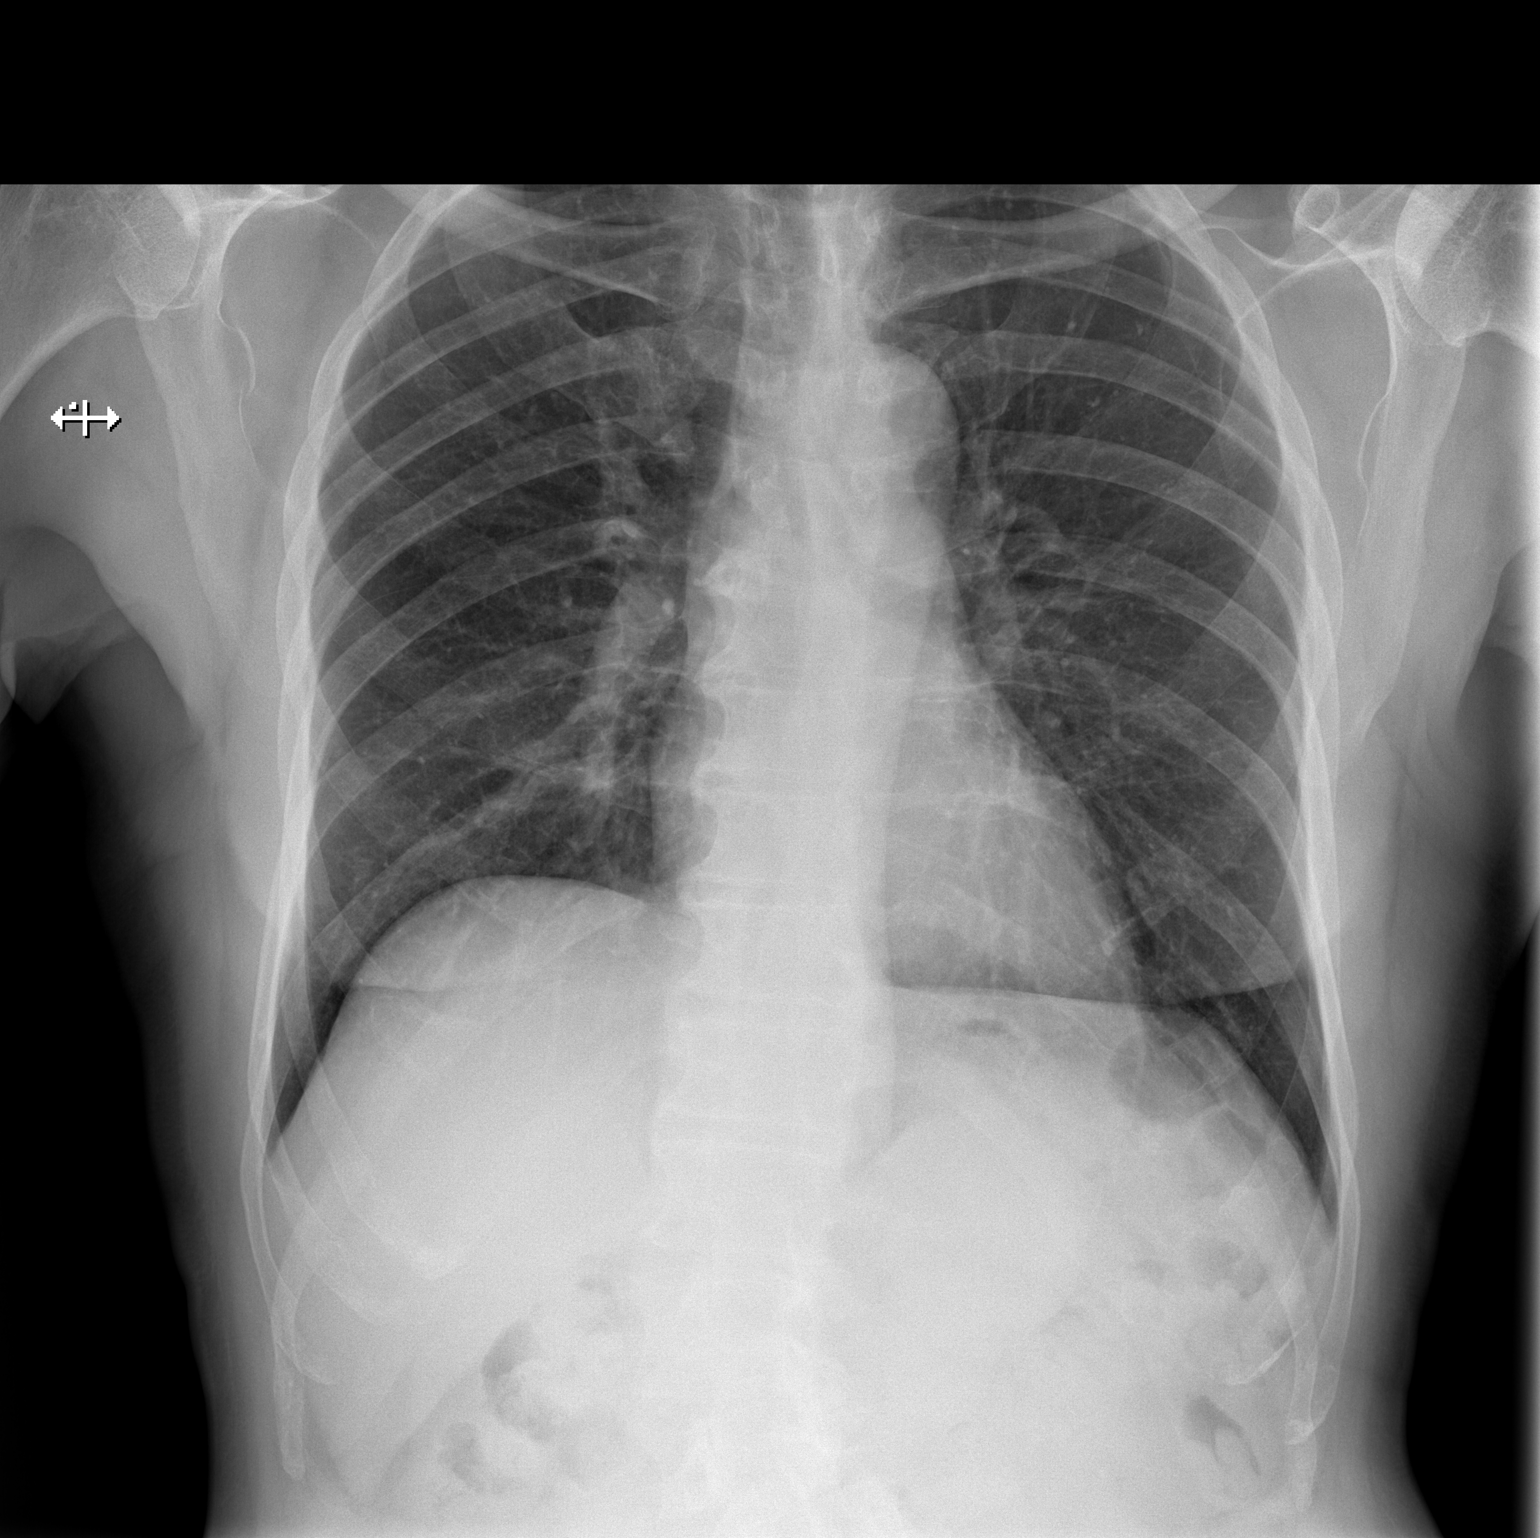

[w chest lat]
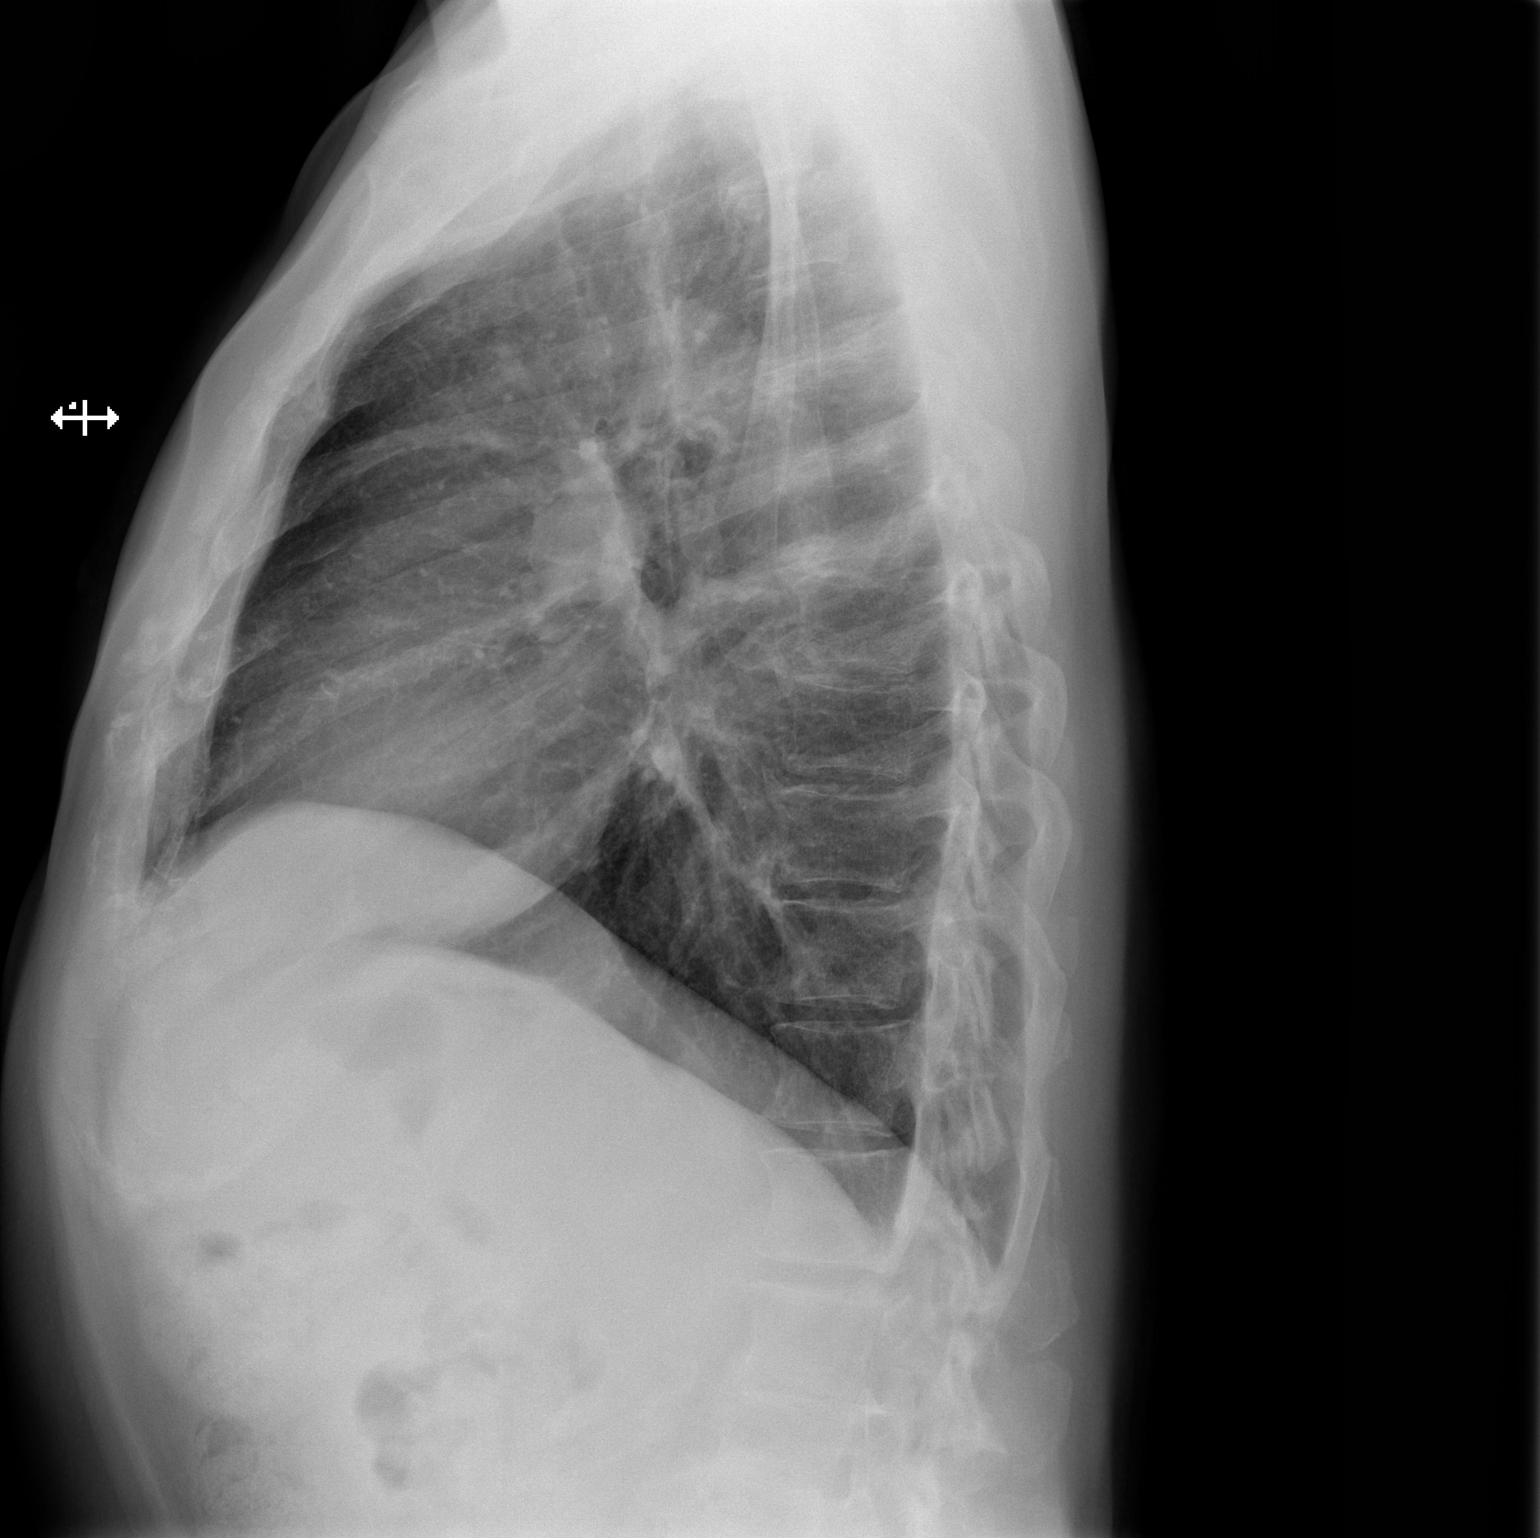

[2 of 2 positions shown; findings below may reference images not displayed]

FINDINGS: Heart and mediastinal contours are within normal limits. No focal
opacities or effusions. No acute bony abnormality.
IMPRESSION: No active cardiopulmonary disease.

## 2021-01-06 ENCOUNTER — Other Ambulatory Visit: Payer: Self-pay | Admitting: Family Medicine

## 2021-01-06 DIAGNOSIS — E118 Type 2 diabetes mellitus with unspecified complications: Secondary | ICD-10-CM

## 2021-01-10 IMAGING — RF DG C-ARM 61-120 MIN
1 series · 2 of 2 positions shown · non-contrast
Comparison: 04/07/2016

CLINICAL DATA: PLIF L3-4.

EXAM:
LUMBAR SPINE - 2-3 VIEW; DG C-ARM 61-120 MIN

[Series 1: run · 2 of 2 slices shown]
[im 1/2]
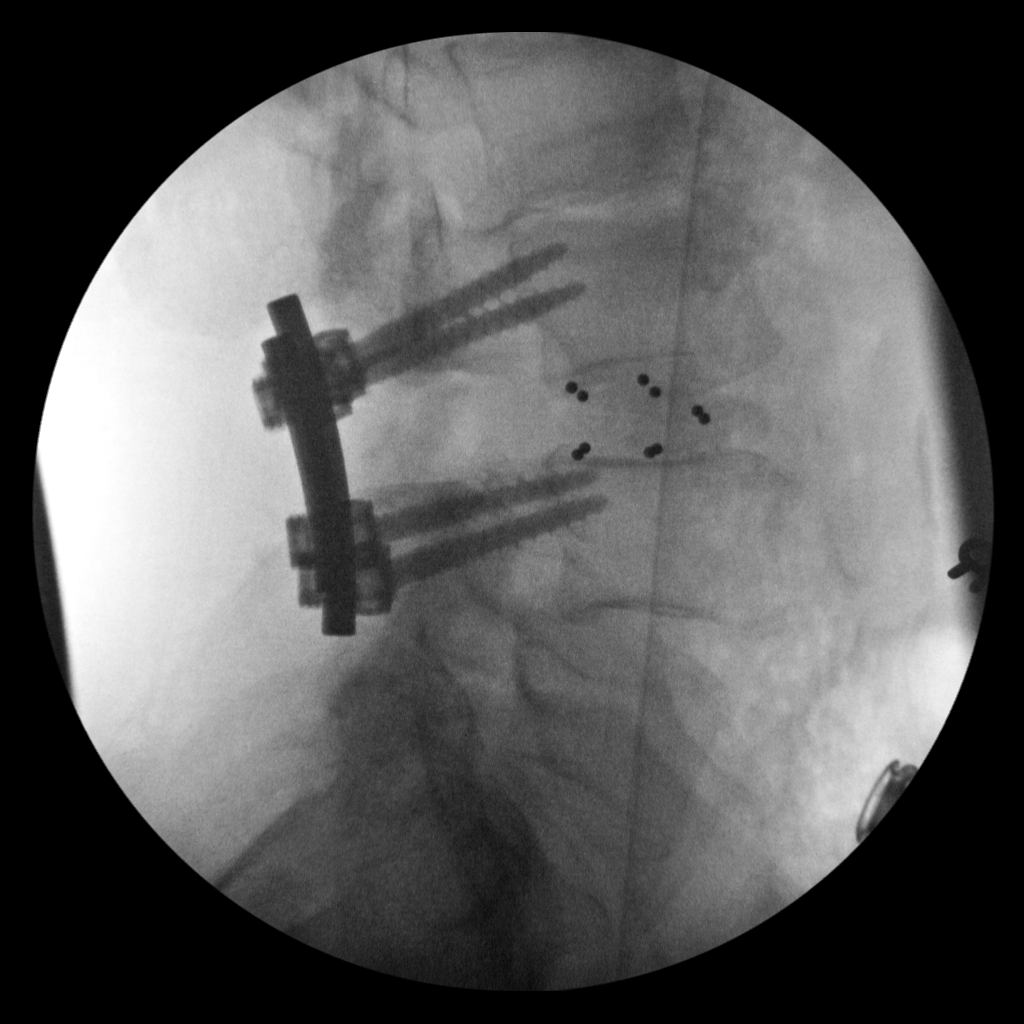
[im 2/2]
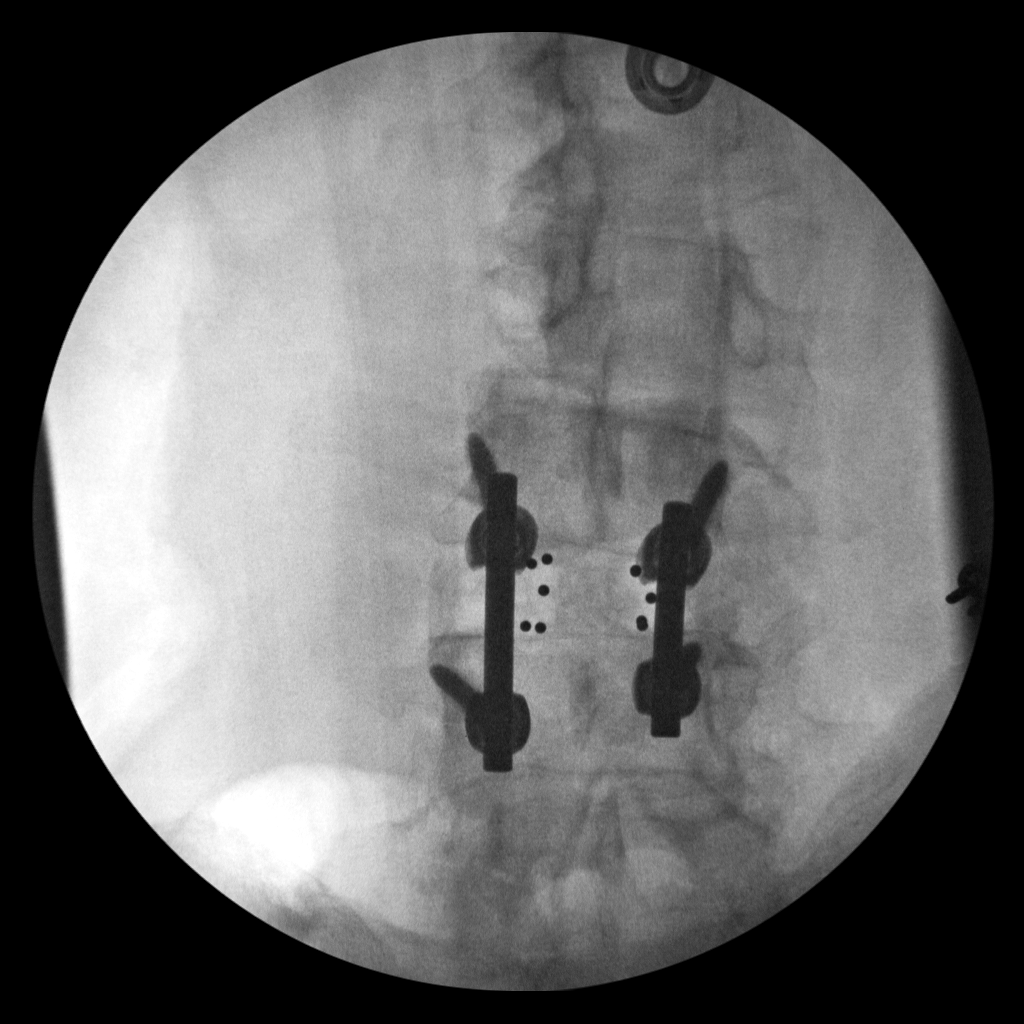

[2 of 2 positions shown; findings below may reference images not displayed]

FINDINGS: 2 C-arm images show removal of previous L4-5 hardware. There is
posterior decompression, diskectomy and fusion at L3-4 with
bilateral pedicle screws and posterior rods. There is
anterolisthesis at L4-5 of approximately 8 or 9 mm.
IMPRESSION: New PLIF L3-4 has a good appearance without visible complication.

## 2021-01-21 ENCOUNTER — Ambulatory Visit: Payer: Medicare Other | Admitting: Family Medicine

## 2021-02-05 ENCOUNTER — Encounter: Payer: Self-pay | Admitting: Family Medicine

## 2021-02-05 ENCOUNTER — Other Ambulatory Visit: Payer: Self-pay

## 2021-02-05 ENCOUNTER — Ambulatory Visit (INDEPENDENT_AMBULATORY_CARE_PROVIDER_SITE_OTHER): Payer: Medicare Other | Admitting: Family Medicine

## 2021-02-05 VITALS — BP 128/70 | HR 75 | Temp 97.5°F | Resp 97 | Ht 68.0 in | Wt 166.6 lb

## 2021-02-05 DIAGNOSIS — E1169 Type 2 diabetes mellitus with other specified complication: Secondary | ICD-10-CM | POA: Diagnosis not present

## 2021-02-05 DIAGNOSIS — R972 Elevated prostate specific antigen [PSA]: Secondary | ICD-10-CM

## 2021-02-05 DIAGNOSIS — N400 Enlarged prostate without lower urinary tract symptoms: Secondary | ICD-10-CM

## 2021-02-05 DIAGNOSIS — E785 Hyperlipidemia, unspecified: Secondary | ICD-10-CM | POA: Diagnosis not present

## 2021-02-05 DIAGNOSIS — Z86010 Personal history of colonic polyps: Secondary | ICD-10-CM | POA: Diagnosis not present

## 2021-02-05 DIAGNOSIS — Z981 Arthrodesis status: Secondary | ICD-10-CM | POA: Diagnosis not present

## 2021-02-05 DIAGNOSIS — E118 Type 2 diabetes mellitus with unspecified complications: Secondary | ICD-10-CM

## 2021-02-05 DIAGNOSIS — I152 Hypertension secondary to endocrine disorders: Secondary | ICD-10-CM | POA: Diagnosis not present

## 2021-02-05 DIAGNOSIS — E1159 Type 2 diabetes mellitus with other circulatory complications: Secondary | ICD-10-CM

## 2021-02-05 LAB — POCT GLYCOSYLATED HEMOGLOBIN (HGB A1C): Hemoglobin A1C: 8.2 % — AB (ref 4.0–5.6)

## 2021-02-05 NOTE — Progress Notes (Signed)
Scott Parks is a 80 y.o. male who presents for annual wellness visit and follow-up on chronic medical conditions.  He has no particular concerns or complaints.  He continues on metformin and [place] and is having no difficulty with that.  He is also taking quinapril as well as atorvastatin with no symptoms or side effects.  He does admit to dietary indiscretion.  He has been keeping himself physically busy.  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  He has 6 grandchildren that keeps him busy.  Does have a history of adenomatous colonic polyp with a colonoscopy in 2019.  He also has elevated PSA as well as some BPH symptoms but presently is monitoring the situation.   Immunizations and Health Maintenance Immunization History  Administered Date(s) Administered   Fluad Quad(high Dose 65+) 05/22/2019   Influenza, High Dose Seasonal PF 07/01/2015, 07/04/2018   Influenza-Unspecified 07/13/2016   PFIZER Comirnaty(Gray Top)Covid-19 Tri-Sucrose Vaccine 11/16/2020   PFIZER(Purple Top)SARS-COV-2 Vaccination 08/29/2019, 09/19/2019, 05/11/2020   Pneumococcal Conjugate-13 12/29/2014   Pneumococcal-Unspecified 07/20/2007   Tdap 06/21/2016   Zoster Recombinat (Shingrix) 12/07/2016, 06/09/2017   Zoster, Live 12/28/2009   Health Maintenance Due  Topic Date Due   OPHTHALMOLOGY EXAM  06/05/2020    Last colonoscopy: 11/22/17.  Adenomatous colonic polyp noted Last PSA: 10/22/13  Dentist:  Q six months Ophtho: Q year Exercise: gym three days a week for hour and half  Other doctors caring for patient include: Dr. Diona Fanti urology, Dr. Tamala Julian optom.   Advanced Directives: Does Patient Have a Medical Advance Directive?: Yes Type of Advance Directive: Living will Does patient want to make changes to medical advance directive?: No - Patient declined Copy asked for Depression screen:  See questionnaire below.     Depression screen [facility] 2/9 01/16/2020 01/15/2019  06/07/2018 05/03/2018 03/08/2017  Decreased Interest 0 0 0 0 0  Down, Depressed, Hopeless 0 0 0 0 0  PHQ - 2 Score 0 0 0 0 0    Fall Screen: See Questionaire below.   Fall Risk  02/05/2021 01/16/2020 01/15/2019 06/07/2018 05/03/2018  Falls in the past year? 0 0 0 No No  Number falls in past yr: 0 - - - -  Injury with Fall? 0 - - - -  Risk for fall due to : No Fall Risks - - - -  Follow up Falls evaluation completed - - - -    ADL screen:  See questionnaire below.  Functional Status Survey: Is the patient deaf or have difficulty hearing?: No Does the patient have difficulty seeing, even when wearing glasses/contacts?: No Does the patient have difficulty concentrating, remembering, or making decisions?: No Does the patient have difficulty walking or climbing stairs?: No Does the patient have difficulty dressing or bathing?: No Does the patient have difficulty doing errands alone such as visiting a doctor's office or shopping?: No   Review of Systems  Constitutional: -, -unexpected weight change, -anorexia, -fatigue Allergy: -sneezing, -itching, -congestion Dermatology: denies changing moles, rash, lumps ENT: -runny nose, -ear pain, -sore throat,  Cardiology:  -chest pain, -palpitations, -orthopnea, Respiratory: -cough, -shortness of breath, -dyspnea on exertion, -wheezing,  Gastroenterology: -abdominal pain, -nausea, -vomiting, -diarrhea, -constipation, -dysphagia Hematology: -bleeding or bruising problems Musculoskeletal: -arthralgias, -myalgias, -joint swelling, -back pain, - Ophthalmology: -vision changes,  Urology: -dysuria, -difficulty urinating,  -urinary frequency, -urgency, incontinence Neurology: -, -numbness, , -memory loss, -falls, -dizziness    PHYSICAL EXAM: General Appearance: Alert, cooperative, no distress,  appears stated age Head: Normocephalic, without obvious abnormality, atraumatic Eyes: PERRL, conjunctiva/corneas clear, EOM's intact, fundi benign Ears: Normal  TM's and external ear canals Nose: Nares normal, mucosa normal, no drainage or sinus   tenderness Throat: Lips, mucosa, and tongue normal; teeth and gums normal Neck: Supple, no lymphadenopathy, thyroid:no enlargement/tenderness/nodules; no carotid bruit or JVD Lungs: Clear to auscultation bilaterally without wheezes, rales or ronchi; respirations unlabored Heart: Regular rate and rhythm, S1 and S2 normal, no murmur, rub or gallop Abdomen: Soft, non-tender, nondistended, normoactive bowel sounds, no masses, no hepatosplenomegaly ESkin: Skin color, texture, turgor normal, no rashes or lesions Lymph nodes: Cervical, supraclavicular, and axillary nodes normal Neurologic: CNII-XII intact, normal strength, sensation and gait; reflexes 2+ and symmetric throughout   Psych: Normal mood, affect, hygiene and grooming Hemoglobin A1c is 8.2 ASSESSMENT/PLAN: Type 2 diabetes mellitus with complication, without long-term current use of insulin (HCC) - Plan: POCT glycosylated hemoglobin (Hb A1C)  Hyperlipidemia associated with type 2 diabetes mellitus (HCC)  Hypertension associated with diabetes (HCC)  Elevated PSA  Benign prostatic hyperplasia without lower urinary tract symptoms  Hx of adenomatous polyp of colon  S/P lumbar fusion Discussed the slowly increasing hemoglobin A1c in terms of diet and exercise.  He recognizes his dietary indiscretion and plans to work harder on that rather than change his medications around.  He does keep himself physically active.  recommended at least 30 minutes of aerobic activity at least 5 days/week; Immunization recommendations discussed.  Colonoscopy recommendations reviewed. Continue on present medications and recheck here in 4 months.  Medicare Attestation I have personally reviewed: The patient's medical and social history Their use of alcohol, tobacco or illicit drugs Their current medications and supplements The patient's functional ability including  ADLs,fall risks, home safety risks, cognitive, and hearing and visual impairment Diet and physical activities Evidence for depression or mood disorders  The patient's weight, height, and BMI have been recorded in the chart.  I have made referrals, counseling, and provided education to the patient based on review of the above and I have provided the patient with a written personalized care plan for preventive services.     Jill Alexanders, MD   02/05/2021

## 2021-03-01 ENCOUNTER — Other Ambulatory Visit: Payer: Self-pay | Admitting: Family Medicine

## 2021-03-01 DIAGNOSIS — E1159 Type 2 diabetes mellitus with other circulatory complications: Secondary | ICD-10-CM

## 2021-03-01 DIAGNOSIS — I152 Hypertension secondary to endocrine disorders: Secondary | ICD-10-CM

## 2021-03-01 DIAGNOSIS — E118 Type 2 diabetes mellitus with unspecified complications: Secondary | ICD-10-CM

## 2021-04-06 ENCOUNTER — Other Ambulatory Visit: Payer: Self-pay | Admitting: Family Medicine

## 2021-04-06 DIAGNOSIS — E118 Type 2 diabetes mellitus with unspecified complications: Secondary | ICD-10-CM

## 2021-04-16 DIAGNOSIS — R972 Elevated prostate specific antigen [PSA]: Secondary | ICD-10-CM | POA: Diagnosis not present

## 2021-04-21 DIAGNOSIS — R972 Elevated prostate specific antigen [PSA]: Secondary | ICD-10-CM | POA: Diagnosis not present

## 2021-04-21 LAB — PSA: PSA: 7.78

## 2021-05-17 DIAGNOSIS — E119 Type 2 diabetes mellitus without complications: Secondary | ICD-10-CM | POA: Diagnosis not present

## 2021-05-17 LAB — HM DIABETES EYE EXAM

## 2021-05-24 ENCOUNTER — Other Ambulatory Visit: Payer: Self-pay

## 2021-05-24 ENCOUNTER — Ambulatory Visit (INDEPENDENT_AMBULATORY_CARE_PROVIDER_SITE_OTHER): Payer: Medicare Other | Admitting: Family Medicine

## 2021-05-24 ENCOUNTER — Encounter: Payer: Self-pay | Admitting: Family Medicine

## 2021-05-24 VITALS — BP 130/80 | HR 67 | Temp 96.0°F | Ht 68.0 in | Wt 164.8 lb

## 2021-05-24 DIAGNOSIS — N529 Male erectile dysfunction, unspecified: Secondary | ICD-10-CM

## 2021-05-24 DIAGNOSIS — E785 Hyperlipidemia, unspecified: Secondary | ICD-10-CM | POA: Diagnosis not present

## 2021-05-24 DIAGNOSIS — Z23 Encounter for immunization: Secondary | ICD-10-CM | POA: Diagnosis not present

## 2021-05-24 DIAGNOSIS — R972 Elevated prostate specific antigen [PSA]: Secondary | ICD-10-CM | POA: Diagnosis not present

## 2021-05-24 DIAGNOSIS — N400 Enlarged prostate without lower urinary tract symptoms: Secondary | ICD-10-CM | POA: Diagnosis not present

## 2021-05-24 DIAGNOSIS — Z86010 Personal history of colonic polyps: Secondary | ICD-10-CM | POA: Diagnosis not present

## 2021-05-24 DIAGNOSIS — E1159 Type 2 diabetes mellitus with other circulatory complications: Secondary | ICD-10-CM

## 2021-05-24 DIAGNOSIS — G629 Polyneuropathy, unspecified: Secondary | ICD-10-CM | POA: Diagnosis not present

## 2021-05-24 DIAGNOSIS — E118 Type 2 diabetes mellitus with unspecified complications: Secondary | ICD-10-CM

## 2021-05-24 DIAGNOSIS — E1169 Type 2 diabetes mellitus with other specified complication: Secondary | ICD-10-CM

## 2021-05-24 DIAGNOSIS — I152 Hypertension secondary to endocrine disorders: Secondary | ICD-10-CM | POA: Diagnosis not present

## 2021-05-24 LAB — POCT GLYCOSYLATED HEMOGLOBIN (HGB A1C): Hemoglobin A1C: 7.4 % — AB (ref 4.0–5.6)

## 2021-05-24 NOTE — Progress Notes (Signed)
Subjective:    Patient ID: Scott Parks, male    DOB: September 27, 1940, 80 y.o.   MRN: [id number]  Scott Parks is a 80 y.o. male who presents for follow-up of Type 2 diabetes mellitus.  Home blood sugar records:  fasting and post meal lowest reading 118 and highest 181 Current symptoms/problems include none and have been stable. Daily foot checks: Yes Any foot concerns: none at this time  Exercise: Home exercise routine includes walking 1.5 hrs per days and 45 min. Gym/ health club routine includes light weights. Diet: good He continues on metformin and [place].  He is also made some dietary changes.  Having no difficulty with atorvastatin or quinapril.  He does follow-up with Dr. Diona Fanti concerning his PSA.  He does have underlying ED but is not interested in any medications.  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  He has had a colonoscopy which showed adenomatous polyp but apparently has not scheduled for follow-up. The following portions of the patient's history were reviewed and updated as appropriate: allergies, current medications, past medical history, past social history and problem list.  ROS as in subjective above.     Objective:    Physical Exam Alert and in no distress otherwise not examined. Hemoglobin A1c is 7.4   Lab Review Diabetic Labs Latest Ref Rng & Units 05/24/2021 02/05/2021 11/16/2020 05/18/2020 01/16/2020  HbA1c 4.0 - 5.6 % 7.4(A) 8.2(A) 7.6(A) 7.5(A) 7.5(A)  Microalbumin mg/L - - - 7.4 -  Micro/Creat Ratio - - - - 8.7 -  Chol 100 - 199 mg/dL - - - 175 -  HDL >39 mg/dL - - - 80 -  Calc LDL 0 - 99 mg/dL - - - 83 -  Triglycerides 0 - 149 mg/dL - - - 61 -  Creatinine 0.76 - 1.27 mg/dL - - - 0.95 -   BP/Weight 05/24/2021 02/05/2021 11/16/2020 50/0/9381 03/16/9936  Systolic BP 169 678 938 101 751  Diastolic BP 80 70 76 80 74  Wt. (Lbs) 164.8 166.6 171.2 166 162.6  BMI 25.06 25.33 25.65 24.87 25.09    Foot/eye exam completion dates Latest Ref Rng & Units 11/16/2020 09/25/2019  Eye Exam No Retinopathy - -  Foot Form Completion - Done Done    Carey  reports that he quit smoking about 36 years ago. His smoking use included cigarettes. He has never used smokeless tobacco. He reports current alcohol use. He reports that he does not use drugs.     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 Compliance at present is estimated to be excellent. Efforts to improve compliance (if necessary) will be directed at  continue with diet and exercise . Follow up: 4 months Discussed the neuropathy with him in regard to  even more vigilance and taking care of his feet due to the neuropathy and underlying diabetes.  Congratulated him on lowering his A1c. Apparently his daughter and mother are concerned about metformin I explained that that is recommended by the AGA as a first drug of choice to treat diabetes.

## 2021-05-31 ENCOUNTER — Other Ambulatory Visit: Payer: Self-pay | Admitting: Family Medicine

## 2021-05-31 DIAGNOSIS — E1159 Type 2 diabetes mellitus with other circulatory complications: Secondary | ICD-10-CM

## 2021-05-31 DIAGNOSIS — E118 Type 2 diabetes mellitus with unspecified complications: Secondary | ICD-10-CM

## 2021-06-07 ENCOUNTER — Other Ambulatory Visit: Payer: Self-pay | Admitting: Family Medicine

## 2021-06-07 DIAGNOSIS — I10 Essential (primary) hypertension: Secondary | ICD-10-CM

## 2021-06-07 DIAGNOSIS — E785 Hyperlipidemia, unspecified: Secondary | ICD-10-CM

## 2021-06-07 DIAGNOSIS — E1169 Type 2 diabetes mellitus with other specified complication: Secondary | ICD-10-CM

## 2021-07-06 ENCOUNTER — Ambulatory Visit: Payer: Medicare Other | Admitting: Family Medicine

## 2021-09-28 ENCOUNTER — Other Ambulatory Visit: Payer: Self-pay

## 2021-09-28 ENCOUNTER — Ambulatory Visit (INDEPENDENT_AMBULATORY_CARE_PROVIDER_SITE_OTHER): Payer: Medicare Other | Admitting: Family Medicine

## 2021-09-28 ENCOUNTER — Encounter: Payer: Self-pay | Admitting: Family Medicine

## 2021-09-28 VITALS — BP 130/80 | HR 72 | Temp 96.7°F | Wt 161.0 lb

## 2021-09-28 DIAGNOSIS — E785 Hyperlipidemia, unspecified: Secondary | ICD-10-CM

## 2021-09-28 DIAGNOSIS — I152 Hypertension secondary to endocrine disorders: Secondary | ICD-10-CM

## 2021-09-28 DIAGNOSIS — E118 Type 2 diabetes mellitus with unspecified complications: Secondary | ICD-10-CM

## 2021-09-28 DIAGNOSIS — N400 Enlarged prostate without lower urinary tract symptoms: Secondary | ICD-10-CM

## 2021-09-28 DIAGNOSIS — E1169 Type 2 diabetes mellitus with other specified complication: Secondary | ICD-10-CM

## 2021-09-28 DIAGNOSIS — R972 Elevated prostate specific antigen [PSA]: Secondary | ICD-10-CM | POA: Diagnosis not present

## 2021-09-28 DIAGNOSIS — Z86010 Personal history of colonic polyps: Secondary | ICD-10-CM

## 2021-09-28 DIAGNOSIS — E1159 Type 2 diabetes mellitus with other circulatory complications: Secondary | ICD-10-CM

## 2021-09-28 LAB — POCT GLYCOSYLATED HEMOGLOBIN (HGB A1C): Hemoglobin A1C: 7.8 % — AB (ref 4.0–5.6)

## 2021-09-28 LAB — POCT UA - MICROALBUMIN
Albumin/Creatinine Ratio, Urine, POC: 11
Creatinine, POC: 80 mg/dL
Microalbumin Ur, POC: 8.8 mg/L

## 2021-09-28 NOTE — Progress Notes (Signed)
Subjective:    Patient ID: Scott Parks, male    DOB: 11-10-1940, 81 y.o.   MRN: [id number]  Scott Parks is a 81 y.o. male who presents for follow-up of Type 2 diabetes mellitus.  Home blood sugar records:  fasting and post meal lowest 107 highest 228 Current symptoms/problems include none and have been unchanged. Daily foot checks: yes   Any foot concerns: none at this  Exercise: Gym/ health club routine includes light weights and walking on track . Diet: good Continues on Glucophage as well as Lipitor and quinapril.  He is having no difficulty with these medications.  He also follows up at the [facility] for his neuropathy and presently is on no medications for that.  Does have a history of BPH as well as elevated PSA and is scheduled to follow-up with urology in the near future.  Also has ED but is not interested in any medications at present time.  He also has a history of colonic polyps however gastroenterologist and said no further intervention necessary. The following portions of the patient's history were reviewed and updated as appropriate: allergies, current medications, past medical history, past social history and problem list.  ROS as in subjective above.     Objective:    Physical Exam Alert and in no distress cardiac exam shows regular rhythm without murmurs or gallops.  Lungs are clear to auscultation.  Diabetic foot exam is normal.  Hemoglobin A1c is 8.2 Lab Review Diabetic Labs Latest Ref Rng & Units 09/28/2021 05/24/2021 02/05/2021 11/16/2020 05/18/2020  HbA1c 4.0 - 5.6 % 7.8(A) 7.4(A) 8.2(A) 7.6(A) 7.5(A)  Microalbumin mg/L - - - - 7.4  Micro/Creat Ratio - - - - - 8.7  Chol 100 - 199 mg/dL - - - - 175  HDL >39 mg/dL - - - - 80  Calc LDL 0 - 99 mg/dL - - - - 83  Triglycerides 0 - 149 mg/dL - - - - 61  Creatinine 0.76 - 1.27 mg/dL - - - - 0.95   BP/Weight 09/28/2021 05/24/2021 02/05/2021 11/16/2020 32/10/5571  Systolic BP 220 254 270 623 762  Diastolic BP 80 80 70 76 80  Wt.  (Lbs) 161 164.8 166.6 171.2 166  BMI 24.48 25.06 25.33 25.65 24.87   Foot/eye exam completion dates Latest Ref Rng & Units 09/28/2021 11/16/2020  Eye Exam No Retinopathy - -  Foot Form Completion - Done Done    Chaos  reports that he quit smoking about 37 years ago. His smoking use included cigarettes. He has never used smokeless tobacco. He reports current alcohol use. He reports that he does not use drugs.     Assessment & Plan:    Type 2 diabetes mellitus with complication, without long-term current use of insulin (HCC) - Plan: POCT glycosylated hemoglobin (Hb A1C), CBC with Differential/Platelet, Comprehensive metabolic panel, Lipid panel, POCT UA - Microalbumin  Hyperlipidemia associated with type 2 diabetes mellitus ([place]) - Plan: Lipid panel, CANCELED: POCT glycosylated hemoglobin (Hb A1C)  Hypertension associated with diabetes ([place]) - Plan: CBC with Differential/Platelet, Comprehensive metabolic panel  Hx of adenomatous polyp of colon  Benign prostatic hyperplasia without lower urinary tract symptoms  Elevated PSA  Rx changes: none Education: Reviewed ABCs of diabetes management (respective goals in parentheses):  A1C (<7), blood pressure (<130/80), and cholesterol (LDL <100). Compliance at present is estimated to be fair. Efforts to improve compliance (if necessary) will be directed at increased exercise. Follow up: 4 months He will follow-up with urology as well  as at the [facility] for the neuropathy.  Encouraged him to do a better job of taking care of himself however in general he does a pretty good job.

## 2021-09-29 LAB — COMPREHENSIVE METABOLIC PANEL
ALT: 9 IU/L (ref 0–44)
AST: 16 IU/L (ref 0–40)
Albumin/Globulin Ratio: 1.8 (ref 1.2–2.2)
Albumin: 4.6 g/dL (ref 3.7–4.7)
Alkaline Phosphatase: 108 IU/L (ref 44–121)
BUN/Creatinine Ratio: 19 (ref 10–24)
BUN: 19 mg/dL (ref 8–27)
Bilirubin Total: 0.3 mg/dL (ref 0.0–1.2)
CO2: 24 mmol/L (ref 20–29)
Calcium: 10.3 mg/dL — ABNORMAL HIGH (ref 8.6–10.2)
Chloride: 106 mmol/L (ref 96–106)
Creatinine, Ser: 0.98 mg/dL (ref 0.76–1.27)
Globulin, Total: 2.6 g/dL (ref 1.5–4.5)
Glucose: 154 mg/dL — ABNORMAL HIGH (ref 70–99)
Potassium: 5.6 mmol/L — ABNORMAL HIGH (ref 3.5–5.2)
Sodium: 144 mmol/L (ref 134–144)
Total Protein: 7.2 g/dL (ref 6.0–8.5)
eGFR: 79 mL/min/{1.73_m2} (ref >59)

## 2021-09-29 LAB — LIPID PANEL
Chol/HDL Ratio: 2.2 ratio (ref 0.0–5.0)
Cholesterol, Total: 182 mg/dL (ref 100–199)
HDL: 84 mg/dL (ref >39)
LDL Chol Calc (NIH): 87 mg/dL (ref 0–99)
Triglycerides: 55 mg/dL (ref 0–149)
VLDL Cholesterol Cal: 11 mg/dL (ref 5–40)

## 2021-09-29 LAB — CBC WITH DIFFERENTIAL/PLATELET
Basophils Absolute: 0.1 10*3/uL (ref 0.0–0.2)
Basos: 1 %
EOS (ABSOLUTE): 0.3 10*3/uL (ref 0.0–0.4)
Eos: 5 %
Hematocrit: 39.6 % (ref 37.5–51.0)
Hemoglobin: 11.7 g/dL — ABNORMAL LOW (ref 13.0–17.7)
Immature Grans (Abs): 0 10*3/uL (ref 0.0–0.1)
Immature Granulocytes: 0 %
Lymphocytes Absolute: 1.7 10*3/uL (ref 0.7–3.1)
Lymphs: 25 %
MCH: 20.2 pg — ABNORMAL LOW (ref 26.6–33.0)
MCHC: 29.5 g/dL — ABNORMAL LOW (ref 31.5–35.7)
MCV: 69 fL — ABNORMAL LOW (ref 79–97)
Monocytes Absolute: 0.5 10*3/uL (ref 0.1–0.9)
Monocytes: 8 %
Neutrophils Absolute: 4.2 10*3/uL (ref 1.4–7.0)
Neutrophils: 61 %
Platelets: 237 10*3/uL (ref 150–450)
RBC: 5.78 x10E6/uL (ref 4.14–5.80)
RDW: 16.6 % — ABNORMAL HIGH (ref 11.6–15.4)
WBC: 6.9 10*3/uL (ref 3.4–10.8)

## 2021-11-24 DIAGNOSIS — R972 Elevated prostate specific antigen [PSA]: Secondary | ICD-10-CM | POA: Diagnosis not present

## 2021-11-24 DIAGNOSIS — N5201 Erectile dysfunction due to arterial insufficiency: Secondary | ICD-10-CM | POA: Diagnosis not present

## 2021-12-21 ENCOUNTER — Encounter: Payer: Self-pay | Admitting: Family Medicine

## 2022-02-08 ENCOUNTER — Encounter: Payer: Self-pay | Admitting: Family Medicine

## 2022-02-08 ENCOUNTER — Ambulatory Visit (INDEPENDENT_AMBULATORY_CARE_PROVIDER_SITE_OTHER): Payer: Medicare Other | Admitting: Family Medicine

## 2022-02-08 VITALS — BP 112/70 | HR 67 | Temp 96.8°F | Ht 67.25 in | Wt 160.6 lb

## 2022-02-08 DIAGNOSIS — R972 Elevated prostate specific antigen [PSA]: Secondary | ICD-10-CM | POA: Diagnosis not present

## 2022-02-08 DIAGNOSIS — E1159 Type 2 diabetes mellitus with other circulatory complications: Secondary | ICD-10-CM | POA: Diagnosis not present

## 2022-02-08 DIAGNOSIS — N529 Male erectile dysfunction, unspecified: Secondary | ICD-10-CM | POA: Diagnosis not present

## 2022-02-08 DIAGNOSIS — E785 Hyperlipidemia, unspecified: Secondary | ICD-10-CM

## 2022-02-08 DIAGNOSIS — N400 Enlarged prostate without lower urinary tract symptoms: Secondary | ICD-10-CM

## 2022-02-08 DIAGNOSIS — E118 Type 2 diabetes mellitus with unspecified complications: Secondary | ICD-10-CM

## 2022-02-08 DIAGNOSIS — G629 Polyneuropathy, unspecified: Secondary | ICD-10-CM | POA: Diagnosis not present

## 2022-02-08 DIAGNOSIS — E1169 Type 2 diabetes mellitus with other specified complication: Secondary | ICD-10-CM

## 2022-02-08 DIAGNOSIS — I152 Hypertension secondary to endocrine disorders: Secondary | ICD-10-CM

## 2022-02-08 DIAGNOSIS — Z860101 Personal history of adenomatous and serrated colon polyps: Secondary | ICD-10-CM

## 2022-02-08 DIAGNOSIS — Z86010 Personal history of colonic polyps: Secondary | ICD-10-CM

## 2022-02-08 LAB — POCT GLYCOSYLATED HEMOGLOBIN (HGB A1C): Hemoglobin A1C: 8.3 % — AB (ref 4.0–5.6)

## 2022-02-09 LAB — CBC WITH DIFFERENTIAL/PLATELET
Basophils Absolute: 0 10*3/uL (ref 0.0–0.2)
Basos: 1 %
EOS (ABSOLUTE): 0.2 10*3/uL (ref 0.0–0.4)
Eos: 3 %
Hematocrit: 47.7 % (ref 37.5–51.0)
Hemoglobin: 15.7 g/dL (ref 13.0–17.7)
Immature Grans (Abs): 0 10*3/uL (ref 0.0–0.1)
Immature Granulocytes: 0 %
Lymphocytes Absolute: 1.9 10*3/uL (ref 0.7–3.1)
Lymphs: 26 %
MCH: 26.9 pg (ref 26.6–33.0)
MCHC: 32.9 g/dL (ref 31.5–35.7)
MCV: 82 fL (ref 79–97)
Monocytes Absolute: 0.5 10*3/uL (ref 0.1–0.9)
Monocytes: 7 %
Neutrophils Absolute: 4.6 10*3/uL (ref 1.4–7.0)
Neutrophils: 63 %
Platelets: 179 10*3/uL (ref 150–450)
RBC: 5.84 x10E6/uL — ABNORMAL HIGH (ref 4.14–5.80)
RDW: 16.3 % — ABNORMAL HIGH (ref 11.6–15.4)
WBC: 7.3 10*3/uL (ref 3.4–10.8)

## 2022-02-09 LAB — COMPREHENSIVE METABOLIC PANEL
ALT: 15 IU/L (ref 0–44)
AST: 14 IU/L (ref 0–40)
Albumin/Globulin Ratio: 2 (ref 1.2–2.2)
Albumin: 4.7 g/dL (ref 3.7–4.7)
Alkaline Phosphatase: 111 IU/L (ref 44–121)
BUN/Creatinine Ratio: 23 (ref 10–24)
BUN: 21 mg/dL (ref 8–27)
Bilirubin Total: 0.4 mg/dL (ref 0.0–1.2)
CO2: 24 mmol/L (ref 20–29)
Calcium: 10.2 mg/dL (ref 8.6–10.2)
Chloride: 101 mmol/L (ref 96–106)
Creatinine, Ser: 0.92 mg/dL (ref 0.76–1.27)
Globulin, Total: 2.3 g/dL (ref 1.5–4.5)
Glucose: 127 mg/dL — ABNORMAL HIGH (ref 70–99)
Potassium: 5.3 mmol/L — ABNORMAL HIGH (ref 3.5–5.2)
Sodium: 142 mmol/L (ref 134–144)
Total Protein: 7 g/dL (ref 6.0–8.5)
eGFR: 85 mL/min/{1.73_m2} (ref >59)

## 2022-02-17 ENCOUNTER — Encounter: Payer: Self-pay | Admitting: Family Medicine

## 2022-04-20 ENCOUNTER — Encounter: Payer: Self-pay | Admitting: Internal Medicine

## 2022-05-19 DIAGNOSIS — R972 Elevated prostate specific antigen [PSA]: Secondary | ICD-10-CM | POA: Diagnosis not present

## 2022-05-24 ENCOUNTER — Encounter: Payer: Self-pay | Admitting: Internal Medicine

## 2022-06-06 ENCOUNTER — Encounter: Payer: Self-pay | Admitting: Internal Medicine

## 2022-06-07 DIAGNOSIS — Z23 Encounter for immunization: Secondary | ICD-10-CM | POA: Diagnosis not present

## 2022-06-13 ENCOUNTER — Ambulatory Visit: Payer: Medicare Other | Admitting: Family Medicine

## 2022-07-15 ENCOUNTER — Other Ambulatory Visit: Payer: Self-pay | Admitting: Family Medicine

## 2022-07-15 DIAGNOSIS — E1169 Type 2 diabetes mellitus with other specified complication: Secondary | ICD-10-CM

## 2022-08-16 ENCOUNTER — Ambulatory Visit: Payer: Medicare Other | Admitting: Family Medicine

## 2022-08-18 ENCOUNTER — Ambulatory Visit: Payer: Medicare Other | Admitting: Family Medicine

## 2022-08-23 ENCOUNTER — Encounter: Payer: Self-pay | Admitting: Internal Medicine

## 2022-09-16 ENCOUNTER — Other Ambulatory Visit: Payer: Self-pay | Admitting: Family Medicine

## 2022-09-16 DIAGNOSIS — E118 Type 2 diabetes mellitus with unspecified complications: Secondary | ICD-10-CM

## 2022-09-16 DIAGNOSIS — E1159 Type 2 diabetes mellitus with other circulatory complications: Secondary | ICD-10-CM

## 2022-09-21 ENCOUNTER — Encounter: Payer: Self-pay | Admitting: Family Medicine

## 2022-09-21 ENCOUNTER — Ambulatory Visit (INDEPENDENT_AMBULATORY_CARE_PROVIDER_SITE_OTHER): Payer: Medicare Other | Admitting: Family Medicine

## 2022-09-21 VITALS — BP 130/78 | HR 72 | Temp 98.0°F | Resp 14 | Wt 160.2 lb

## 2022-09-21 DIAGNOSIS — E785 Hyperlipidemia, unspecified: Secondary | ICD-10-CM

## 2022-09-21 DIAGNOSIS — E118 Type 2 diabetes mellitus with unspecified complications: Secondary | ICD-10-CM

## 2022-09-21 DIAGNOSIS — I152 Hypertension secondary to endocrine disorders: Secondary | ICD-10-CM | POA: Diagnosis not present

## 2022-09-21 DIAGNOSIS — E1159 Type 2 diabetes mellitus with other circulatory complications: Secondary | ICD-10-CM | POA: Diagnosis not present

## 2022-09-21 DIAGNOSIS — E1169 Type 2 diabetes mellitus with other specified complication: Secondary | ICD-10-CM

## 2022-09-21 LAB — POCT GLYCOSYLATED HEMOGLOBIN (HGB A1C): Hemoglobin A1C: 8.4 % — AB (ref 4.0–5.6)

## 2022-09-21 NOTE — Progress Notes (Signed)
Subjective:    Patient ID: Scott Parks, male    DOB: [birth year], 82 y.o.   MRN: [id number]  Scott Parks is a 82 y.o. male who presents for follow-up of Type 2 diabetes mellitus.  Home blood sugar records: fasting range: 145 average Current symptoms/problems include paresthesia of the feet and have been worsening.  [facility] and apparently this is secondary to agent orange.  He also had a recent screening done by an outside organization this did show possible peripheral vascular disease.  He does follow-up with urology concerning his elevated PSA and apparently no intervention is needed at the present time.  His exercise recently has been diminished due to her URI and subsequently he then developed COVID which has interfered with his physical activity.  He continues on [place] and metformin.  He is also taking atorvastatin as well as Accupril. Daily foot checks: yes   Any foot concerns: no How often blood sugars checked: 7 times a week Exercise: Home exercise routine includes walking and weight lifting. Diet: well balanced  The following portions of the patient's history were reviewed and updated as appropriate: allergies, current medications, past medical history, past social history and problem list.  ROS as in subjective above.     Objective:    Physical Exam Alert and in no distress otherwise not examined.  Hemoglobin A1c is 8.4  Blood pressure 130/78, pulse 72, temperature 98 F (36.7 C), temperature source Oral, resp. rate 14, weight 160 lb 3.2 oz (72.7 kg), SpO2 95 %.  Lab Review    Latest Ref Rng & Units 09/21/2022   11:43 AM 02/08/2022    2:59 PM 02/08/2022    2:54 PM 09/28/2021   11:59 AM 09/28/2021    9:55 AM  Diabetic Labs  HbA1c 4.0 - 5.6 % 8.4  8.3      Microalbumin mg/L    8.8    Micro/Creat Ratio     11.0    Chol 100 - 199 mg/dL     182   HDL >39 mg/dL     84   Calc LDL 0 - 99 mg/dL     87   Triglycerides 0 - 149 mg/dL     55   Creatinine 0.76  - 1.27 mg/dL   0.92   0.98       09/21/2022   11:31 AM 02/08/2022    1:53 PM 09/28/2021    9:16 AM 05/24/2021   10:13 AM 02/05/2021    2:13 PM  BP/Weight  Systolic BP 628 315 176 160 737  Diastolic BP 78 70 80 80 70  Wt. (Lbs) 160.2 160.6 161 164.8 166.6  BMI 24.9 kg/m2 24.97 kg/m2 24.48 kg/m2 25.06 kg/m2 25.33 kg/m2      Latest Ref Rng & Units 09/28/2021    9:15 AM 05/17/2021   12:00 AM  Foot/eye exam completion dates  Eye Exam No Retinopathy  No Retinopathy      Foot Form Completion  Done      This result is from an external source.    Scott Parks  reports that he quit smoking about 38 years ago. His smoking use included cigarettes. He has never used smokeless tobacco. He reports current alcohol use. He reports that he does not use drugs.     Assessment & Plan:    Type 2 diabetes mellitus with complication, without long-term current use of insulin (HCC) - Plan: POCT glycosylated hemoglobin (Hb A1C), CBC with Differential/Platelet, Comprehensive  metabolic panel, Lipid panel, VAS [place] ABI WITH/WO TBI  Hypertension associated with diabetes ([place]) - Plan: CBC with Differential/Platelet, Comprehensive metabolic panel  Hyperlipidemia associated with type 2 diabetes mellitus ([place]) - Plan: Lipid panel  He started taking aspirin and I recommend that he stop taking it.  We will order an ABI on him to follow-up on the possible peripheral vascular disease.  Discussed the neuropathy with him and he at this point is having no difficulty so further intervention is really not needed.  He was comfortable with that.  Recheck here in 4 months.

## 2022-09-22 LAB — COMPREHENSIVE METABOLIC PANEL
ALT: 14 IU/L (ref 0–44)
AST: 14 IU/L (ref 0–40)
Albumin/Globulin Ratio: 2 (ref 1.2–2.2)
Albumin: 4.5 g/dL (ref 3.8–4.8)
Alkaline Phosphatase: 108 IU/L (ref 44–121)
BUN/Creatinine Ratio: 19 (ref 10–24)
BUN: 19 mg/dL (ref 8–27)
Bilirubin Total: 0.5 mg/dL (ref 0.0–1.2)
CO2: 25 mmol/L (ref 20–29)
Calcium: 10.1 mg/dL (ref 8.6–10.2)
Chloride: 98 mmol/L (ref 96–106)
Creatinine, Ser: 1.01 mg/dL (ref 0.76–1.27)
Globulin, Total: 2.2 g/dL (ref 1.5–4.5)
Glucose: 114 mg/dL — ABNORMAL HIGH (ref 70–99)
Potassium: 5.1 mmol/L (ref 3.5–5.2)
Sodium: 137 mmol/L (ref 134–144)
Total Protein: 6.7 g/dL (ref 6.0–8.5)
eGFR: 76 mL/min/{1.73_m2} (ref >59)

## 2022-09-22 LAB — CBC WITH DIFFERENTIAL/PLATELET
Basophils Absolute: 0 10*3/uL (ref 0.0–0.2)
Basos: 1 %
EOS (ABSOLUTE): 0.3 10*3/uL (ref 0.0–0.4)
Eos: 4 %
Hematocrit: 46.6 % (ref 37.5–51.0)
Hemoglobin: 15.4 g/dL (ref 13.0–17.7)
Immature Grans (Abs): 0 10*3/uL (ref 0.0–0.1)
Immature Granulocytes: 0 %
Lymphocytes Absolute: 1.5 10*3/uL (ref 0.7–3.1)
Lymphs: 21 %
MCH: 27.9 pg (ref 26.6–33.0)
MCHC: 33 g/dL (ref 31.5–35.7)
MCV: 84 fL (ref 79–97)
Monocytes Absolute: 0.4 10*3/uL (ref 0.1–0.9)
Monocytes: 6 %
Neutrophils Absolute: 4.8 10*3/uL (ref 1.4–7.0)
Neutrophils: 68 %
Platelets: 206 10*3/uL (ref 150–450)
RBC: 5.52 x10E6/uL (ref 4.14–5.80)
RDW: 13.1 % (ref 11.6–15.4)
WBC: 7.1 10*3/uL (ref 3.4–10.8)

## 2022-09-22 LAB — LIPID PANEL
Chol/HDL Ratio: 2.2 ratio (ref 0.0–5.0)
Cholesterol, Total: 164 mg/dL (ref 100–199)
HDL: 75 mg/dL (ref >39)
LDL Chol Calc (NIH): 74 mg/dL (ref 0–99)
Triglycerides: 78 mg/dL (ref 0–149)
VLDL Cholesterol Cal: 15 mg/dL (ref 5–40)

## 2022-10-06 ENCOUNTER — Ambulatory Visit (HOSPITAL_COMMUNITY)
Admission: RE | Admit: 2022-10-06 | Discharge: 2022-10-06 | Disposition: A | Discharge status: Home / Self Care | Payer: Medicare Other | Source: Ambulatory Visit | Attending: Cardiology | Admitting: Cardiology

## 2022-10-06 DIAGNOSIS — E118 Type 2 diabetes mellitus with unspecified complications: Secondary | ICD-10-CM | POA: Diagnosis not present

## 2022-10-06 LAB — VAS US ABI WITH/WO TBI

## 2022-10-21 DIAGNOSIS — Z23 Encounter for immunization: Secondary | ICD-10-CM | POA: Diagnosis not present

## 2022-11-08 ENCOUNTER — Telehealth: Payer: Self-pay | Admitting: Family Medicine

## 2022-11-08 DIAGNOSIS — E118 Type 2 diabetes mellitus with unspecified complications: Secondary | ICD-10-CM

## 2022-11-08 MED ORDER — METFORMIN HCL 1000 MG PO TABS: 1000.0000 mg | ORAL_TABLET | Freq: Two times a day (BID) | ORAL | 0 refills | Status: DC

## 2022-11-08 MED ORDER — METFORMIN HCL 1000 MG PO TABS: ORAL_TABLET | ORAL | 0 refills | Status: DC

## 2022-11-08 NOTE — Telephone Encounter (Signed)
Pt called and states he needs refills on metformin. Pt states it has been awhile because [facility] double his order but he is finally out, He will need a short order sent to a local pharmacy. [facility] [place]. Then [facility]. Pt can be reached at  [phone].

## 2022-11-08 NOTE — Telephone Encounter (Signed)
Sent 15 day supply to local pharmacy and then 90 day at [facility]

## 2022-12-13 ENCOUNTER — Encounter: Payer: Self-pay | Admitting: Family Medicine

## 2022-12-13 ENCOUNTER — Telehealth (INDEPENDENT_AMBULATORY_CARE_PROVIDER_SITE_OTHER): Payer: Medicare Other | Admitting: Family Medicine

## 2022-12-13 VITALS — Wt 160.0 lb

## 2022-12-13 DIAGNOSIS — E1159 Type 2 diabetes mellitus with other circulatory complications: Secondary | ICD-10-CM | POA: Diagnosis not present

## 2022-12-13 DIAGNOSIS — I152 Hypertension secondary to endocrine disorders: Secondary | ICD-10-CM

## 2022-12-13 MED ORDER — LISINOPRIL 20 MG PO TABS: 20.0000 mg | ORAL_TABLET | Freq: Every day | ORAL | 0 refills | Status: DC

## 2022-12-13 NOTE — Progress Notes (Signed)
   Subjective:    Patient ID: Scott Parks, male    DOB: 1941-06-29, 82 y.o.   MRN: [id number]  HPI Documentation for virtual audio and video telecommunications through [place] encounter:  The patient was located at home. 2 patient identifiers used.  The provider was located in the office. The patient did consent to this visit and is aware of possible charges through their insurance for this visit.  The other persons participating in this telemedicine service were none. Time spent on call was 5 minutes and in review of previous records >10 minutes total for counseling and coordination of care.  This virtual service is not related to other E/M service within previous 7 days.  [facility] called stating quinapril was no longer being manufactured.  I relayed this information to him and explained that I would switch him to a different ACE inhibitor.  Review of Systems     Objective:   Physical Exam  Alert and in no distress otherwise not examined      Assessment & Plan:  Hypertension associated with diabetes (HCC) - Plan: lisinopril (ZESTRIL) 20 MG tablet Discussed the new medication and possible side effects.  He will call if he has any difficulties otherwise I will see him with his next diabetes checkup.

## 2022-12-14 DIAGNOSIS — R972 Elevated prostate specific antigen [PSA]: Secondary | ICD-10-CM | POA: Diagnosis not present

## 2023-01-11 ENCOUNTER — Other Ambulatory Visit: Payer: Self-pay | Admitting: Family Medicine

## 2023-01-11 DIAGNOSIS — E1169 Type 2 diabetes mellitus with other specified complication: Secondary | ICD-10-CM

## 2023-01-14 ENCOUNTER — Other Ambulatory Visit: Payer: Self-pay | Admitting: Family Medicine

## 2023-01-14 DIAGNOSIS — E118 Type 2 diabetes mellitus with unspecified complications: Secondary | ICD-10-CM

## 2023-01-24 ENCOUNTER — Ambulatory Visit (INDEPENDENT_AMBULATORY_CARE_PROVIDER_SITE_OTHER): Payer: Medicare Other

## 2023-01-24 VITALS — Ht 68.0 in | Wt 160.0 lb

## 2023-01-24 DIAGNOSIS — Z0000 Encounter for general adult medical examination without abnormal findings: Secondary | ICD-10-CM | POA: Diagnosis not present

## 2023-01-24 NOTE — Patient Instructions (Signed)
Scott Parks , Thank you for taking time to come for your Medicare Wellness Visit. I appreciate your ongoing commitment to your health goals. Please review the following plan we discussed and let me know if I can assist you in the future.   These are the goals we discussed:  Goals      Patient Stated     01/24/2023, stay healthy        This is a list of the screening recommended for you and due dates:  Health Maintenance  Topic Date Due   Eye exam for diabetics  05/17/2022   Yearly kidney health urinalysis for diabetes  09/28/2022   Complete foot exam   09/28/2022   Pneumonia Vaccine (2 of 2 - PPSV23 or PCV20) 08/15/2023*   Flu Shot  03/16/2023   Hemoglobin A1C  03/22/2023   Yearly kidney function blood test for diabetes  09/22/2023   Medicare Annual Wellness Visit  01/24/2024   DTaP/Tdap/Td vaccine (2 - Td or Tdap) 06/21/2026   COVID-19 Vaccine  Completed   Hepatitis C Screening  Completed   Zoster (Shingles) Vaccine  Completed   HPV Vaccine  Aged Out   Colon Cancer Screening  Discontinued  *Topic was postponed. The date shown is not the original due date.    Advanced directives: Please bring a copy of your POA (Power of Attorney) and/or Living Will to your next appointment.   Conditions/risks identified: none  Next appointment: Follow up in one year for your annual wellness visit.   Preventive Care 80 Years and Older, Male  Preventive care refers to lifestyle choices and visits with your health care provider that can promote health and wellness. What does preventive care include? A yearly physical exam. This is also called an annual well check. Dental exams once or twice a year. Routine eye exams. Ask your health care provider how often you should have your eyes checked. Personal lifestyle choices, including: Daily care of your teeth and gums. Regular physical activity. Eating a healthy diet. Avoiding tobacco and drug use. Limiting alcohol use. Practicing safe  sex. Taking low doses of aspirin every day. Taking vitamin and mineral supplements as recommended by your health care provider. What happens during an annual well check? The services and screenings done by your health care provider during your annual well check will depend on your age, overall health, lifestyle risk factors, and family history of disease. Counseling  Your health care provider may ask you questions about your: Alcohol use. Tobacco use. Drug use. Emotional well-being. Home and relationship well-being. Sexual activity. Eating habits. History of falls. Memory and ability to understand (cognition). Work and work Astronomer. Screening  You may have the following tests or measurements: Height, weight, and BMI. Blood pressure. Lipid and cholesterol levels. These may be checked every 5 years, or more frequently if you are over 73 years old. Skin check. Lung cancer screening. You may have this screening every year starting at age 41 if you have a 30-pack-year history of smoking and currently smoke or have quit within the past 15 years. Fecal occult blood test (FOBT) of the stool. You may have this test every year starting at age 34. Flexible sigmoidoscopy or colonoscopy. You may have a sigmoidoscopy every 5 years or a colonoscopy every 10 years starting at age 71. Prostate cancer screening. Recommendations will vary depending on your family history and other risks. Hepatitis C blood test. Hepatitis B blood test. Sexually transmitted disease (STD) testing. Diabetes screening. This is  done by checking your blood sugar (glucose) after you have not eaten for a while (fasting). You may have this done every 1-3 years. Abdominal aortic aneurysm (AAA) screening. You may need this if you are a current or former smoker. Osteoporosis. You may be screened starting at age 62 if you are at high risk. Talk with your health care provider about your test results, treatment options, and if  necessary, the need for more tests. Vaccines  Your health care provider may recommend certain vaccines, such as: Influenza vaccine. This is recommended every year. Tetanus, diphtheria, and acellular pertussis (Tdap, Td) vaccine. You may need a Td booster every 10 years. Zoster vaccine. You may need this after age 55. Pneumococcal 13-valent conjugate (PCV13) vaccine. One dose is recommended after age 18. Pneumococcal polysaccharide (PPSV23) vaccine. One dose is recommended after age 88. Talk to your health care provider about which screenings and vaccines you need and how often you need them. This information is not intended to replace advice given to you by your health care provider. Make sure you discuss any questions you have with your health care provider. Document Released: 08/28/2015 Document Revised: 04/20/2016 Document Reviewed: 06/02/2015 Elsevier Interactive Patient Education  2017 [employer].  Fall Prevention in the Home Falls can cause injuries. They can happen to people of all ages. There are many things you can do to make your home safe and to help prevent falls. What can I do on the outside of my home? Regularly fix the edges of walkways and driveways and fix any cracks. Remove anything that might make you trip as you walk through a door, such as a raised step or threshold. Trim any bushes or trees on the path to your home. Use bright outdoor lighting. Clear any walking paths of anything that might make someone trip, such as rocks or tools. Regularly check to see if handrails are loose or broken. Make sure that both sides of any steps have handrails. Any raised decks and porches should have guardrails on the edges. Have any leaves, snow, or ice cleared regularly. Use sand or salt on walking paths during winter. Clean up any spills in your garage right away. This includes oil or grease spills. What can I do in the bathroom? Use night lights. Install grab bars by the toilet  and in the tub and shower. Do not use towel bars as grab bars. Use non-skid mats or decals in the tub or shower. If you need to sit down in the shower, use a plastic, non-slip stool. Keep the floor dry. Clean up any water that spills on the floor as soon as it happens. Remove soap buildup in the tub or shower regularly. Attach bath mats securely with double-sided non-slip rug tape. Do not have throw rugs and other things on the floor that can make you trip. What can I do in the bedroom? Use night lights. Make sure that you have a light by your bed that is easy to reach. Do not use any sheets or blankets that are too big for your bed. They should not hang down onto the floor. Have a firm chair that has side arms. You can use this for support while you get dressed. Do not have throw rugs and other things on the floor that can make you trip. What can I do in the kitchen? Clean up any spills right away. Avoid walking on wet floors. Keep items that you use a lot in easy-to-reach places. If you need  to reach something above you, use a strong step stool that has a grab bar. Keep electrical cords out of the way. Do not use floor polish or wax that makes floors slippery. If you must use wax, use non-skid floor wax. Do not have throw rugs and other things on the floor that can make you trip. What can I do with my stairs? Do not leave any items on the stairs. Make sure that there are handrails on both sides of the stairs and use them. Fix handrails that are broken or loose. Make sure that handrails are as long as the stairways. Check any carpeting to make sure that it is firmly attached to the stairs. Fix any carpet that is loose or worn. Avoid having throw rugs at the top or bottom of the stairs. If you do have throw rugs, attach them to the floor with carpet tape. Make sure that you have a light switch at the top of the stairs and the bottom of the stairs. If you do not have them, ask someone to add  them for you. What else can I do to help prevent falls? Wear shoes that: Do not have high heels. Have rubber bottoms. Are comfortable and fit you well. Are closed at the toe. Do not wear sandals. If you use a stepladder: Make sure that it is fully opened. Do not climb a closed stepladder. Make sure that both sides of the stepladder are locked into place. Ask someone to hold it for you, if possible. Clearly mark and make sure that you can see: Any grab bars or handrails. First and last steps. Where the edge of each step is. Use tools that help you move around (mobility aids) if they are needed. These include: Canes. Walkers. Scooters. Crutches. Turn on the lights when you go into a dark area. Replace any light bulbs as soon as they burn out. Set up your furniture so you have a clear path. Avoid moving your furniture around. If any of your floors are uneven, fix them. If there are any pets around you, be aware of where they are. Review your medicines with your doctor. Some medicines can make you feel dizzy. This can increase your chance of falling. Ask your doctor what other things that you can do to help prevent falls. This information is not intended to replace advice given to you by your health care provider. Make sure you discuss any questions you have with your health care provider. Document Released: 05/28/2009 Document Revised: 01/07/2016 Document Reviewed: 09/05/2014 Elsevier Interactive Patient Education  2017 [employer].

## 2023-01-24 NOTE — Progress Notes (Signed)
I connected with  Layla Evaristo on 01/24/23 by a audio enabled telemedicine application and verified that I am speaking with the correct person using two identifiers.  Patient Location: Home  Provider Location: Office/Clinic  I discussed the limitations of evaluation and management by telemedicine. The patient expressed understanding and agreed to proceed. Subjective:   Scott Parks is a 82 y.o. male who presents for Medicare Annual/Subsequent preventive examination.  Review of Systems     Cardiac Risk Factors include: advanced age (>22men, >60 women);diabetes mellitus;dyslipidemia;hypertension;male gender     Objective:    Today's Vitals   01/24/23 0811  Weight: 160 lb (72.6 kg)  Height: 5\' 8"  (1.727 m)   Body mass index is 24.33 kg/m.     01/24/2023    8:16 AM 02/08/2022    1:45 PM 02/05/2021    2:02 PM 01/16/2020    9:06 AM 01/15/2019    8:49 AM 11/30/2018    2:30 PM 11/22/2018   10:01 AM  Advanced Directives  Does Patient Have a Medical Advance Directive? Yes Yes Yes Yes Yes Yes Yes  Type of Advance Directive Living will Living will;Healthcare Power of Attorney Living will Healthcare Power of [place];Living will Healthcare Power of [place];Living will Healthcare Power of [employer] Power of Attorney  Does patient want to make changes to medical advance directive?  No - Patient declined No - Patient declined No - Patient declined No - Patient declined No - Patient declined No - Patient declined  Copy of Healthcare Power of Attorney in Chart?  No - copy requested  No - copy requested No - copy requested No - copy requested No - copy requested  Would patient like information on creating a medical advance directive?       No - Patient declined    Current Medications (verified) Outpatient Encounter Medications as of 01/24/2023  Medication Sig   atorvastatin (LIPITOR) 40 MG tablet TAKE 1 TABLET DAILY   dapagliflozin propanediol (FARXIGA) 5 MG TABS tablet TAKE 1 TABLET  DAILY BEFORE BREAKFAST   Ferrous Sulfate (IRON PO) Take by mouth.   glucose blood (ONETOUCH VERIO) test strip Use as instructed   lisinopril (ZESTRIL) 20 MG tablet Take 1 tablet (20 mg total) by mouth daily.   metFORMIN (GLUCOPHAGE) 1000 MG tablet Take 1 tablet (1,000 mg total) by mouth 2 (two) times daily with a meal.   metFORMIN (GLUCOPHAGE) 1000 MG tablet TAKE 1 TABLET TWICE A DAY WITH MEALS   Pediatric Multivitamins-Iron (MULTI-DELYN/IRON PO) Take 2 tablets by mouth daily.    vitamin C (ASCORBIC ACID) 500 MG tablet Take 1,000 mg by mouth daily.   aspirin EC 81 MG tablet Take 81 mg by mouth daily. Swallow whole. (Patient not taking: Reported on 12/13/2022)   No facility-administered encounter medications on file as of 01/24/2023.    Allergies (verified) Patient has no known allergies.   History: Past Medical History:  Diagnosis Date   Arthritis    Diabetes mellitus without complication (HCC)    Type II   Hx of adenomatous polyp of colon 11/29/2017   Hyperlipidemia    Hypertension    Past Surgical History:  Procedure Laterality Date   COLONOSCOPY     HAND SURGERY Right    KNEE ARTHROSCOPY Left    LUMBAR LAMINECTOMY/DECOMPRESSION MICRODISCECTOMY Right 04/07/2016   Procedure: hemilaminectomy  - Lumbar tow-three - Lumbar three-four - right;  Surgeon: Tia Alert, MD;  Location: [facility];  Service: Neurosurgery;  Laterality: Right;   MAXIMUM  ACCESS (MAS)POSTERIOR LUMBAR INTERBODY FUSION (PLIF) 1 LEVEL N/A 04/07/2016   Procedure: lumbar two-three, three-four, four-five laminectomy lumbar four-five fusion with pedicle screws (PLIF without interbodies);  Surgeon: Tia Alert, MD;  Location: [facility]UR[facility];  Service: Neurosurgery;  Laterality: N/A;   TRIGGER FINGER RELEASE Left    ring finger   Family History  Problem Relation Age of Onset   Colon polyps Neg Hx    Colon cancer Neg Hx    Esophageal cancer Neg Hx    Rectal cancer Neg Hx    Stomach cancer Neg Hx    Social  History   Socioeconomic History   Marital status: Married    Spouse name: Not on file   Number of children: Not on file   Years of education: Not on file   Highest education level: Not on file  Occupational History   Not on file  Tobacco Use   Smoking status: Former    Types: Cigarettes    Quit date: 08/15/1984    Years since quitting: 38.4   Smokeless tobacco: Never   Tobacco comments:    Quit in 1986  Vaping Use   Vaping Use: Never used  Substance and Sexual Activity   Alcohol use: Yes    Comment: occasionally   Drug use: No   Sexual activity: Yes  Other Topics Concern   Not on file  Social History Narrative   Not on file   Social Determinants of Health   Financial Resource Strain: Low Risk  (01/24/2023)   Overall Financial Resource Strain (CARDIA)    Difficulty of Paying Living Expenses: Not hard at all  Food Insecurity: No Food Insecurity (01/24/2023)   Hunger Vital Sign    Worried About Running Out of Food in the Last Year: Never true    Ran Out of Food in the Last Year: Never true  Transportation Needs: No Transportation Needs (01/24/2023)   PRAPARE - Administrator, Civil Service (Medical): No    Lack of Transportation (Non-Medical): No  Physical Activity: Sufficiently Active (01/24/2023)   Exercise Vital Sign    Days of Exercise per Week: 6 days    Minutes of Exercise per Session: 60 min  Stress: No Stress Concern Present (01/24/2023)   [employer] of Occupational Health - Occupational Stress Questionnaire    Feeling of Stress : Not at all  Social Connections: Not on file    Tobacco Counseling Counseling given: Not Answered Tobacco comments: Quit in 1986   Clinical Intake:  Pre-visit preparation completed: Yes  Pain : No/denies pain     Nutritional Status: BMI of 19-24  Normal Nutritional Risks: None Diabetes: No  How often do you need to have someone help you when you read instructions, pamphlets, or other written materials from  your doctor or pharmacy?: 1 - Never  Diabetic? Yes Nutrition Risk Assessment:  Has the patient had any N/V/D within the last 2 months?  No  Does the patient have any non-healing wounds?  No  Has the patient had any unintentional weight loss or weight gain?  No   Diabetes:  Is the patient diabetic?  Yes  If diabetic, was a CBG obtained today?  No  Did the patient bring in their glucometer from home?  No  How often do you monitor your CBG's? Every third day.   Financial Strains and Diabetes Management:  Are you having any financial strains with the device, your supplies or your medication? No .  Does  the patient want to be seen by Chronic Care Management for management of their diabetes?  No  Would the patient like to be referred to a Nutritionist or for Diabetic Management?  No   Diabetic Exams:  Diabetic Eye Exam: Overdue for diabetic eye exam. Pt has been advised about the importance in completing this exam. Patient advised to call and schedule an eye exam. Diabetic Foot Exam: Overdue, Pt has been advised about the importance in completing this exam. Pt is scheduled for diabetic foot exam on next appointment.  Interpreter Needed?: No  Information entered by :: NAllen LPN   Activities of Daily Living    01/24/2023    8:17 AM 02/08/2022    1:46 PM  In your present state of health, do you have any difficulty performing the following activities:  Hearing? 0 0  Vision? 0 0  Difficulty concentrating or making decisions? 0 0  Walking or climbing stairs? 0 0  Dressing or bathing? 0 0  Doing errands, shopping? 0 0  Preparing Food and eating ? N N  Using the Toilet? N N  In the past six months, have you accidently leaked urine? N Y  Do you have problems with loss of bowel control? N N  Managing your Medications? N N  Managing your Finances? N N  Housekeeping or managing your Housekeeping? N N    Patient Care Team: Ronnald Nian, MD as PCP - General (Family  Medicine) Alleen Borne, MD as Consulting Physician (Cardiothoracic Surgery)  Indicate any recent Medical Services you may have received from other than [facility] providers in the past year (date may be approximate).     Assessment:   This is a routine wellness examination for Scott Parks.  Hearing/Vision screen Vision Screening - Comments:: Regular eye exams, [facility]  Dietary issues and exercise activities discussed: Current Exercise Habits: Home exercise routine, Type of exercise: walking;strength training/weights, Time (Minutes): 60, Frequency (Times/Week): 6, Weekly Exercise (Minutes/Week): 360   Goals Addressed             This Visit's Progress    Patient Stated       01/24/2023, stay healthy       Depression Screen    01/24/2023    8:16 AM 02/08/2022    1:45 PM 09/28/2021    9:11 AM 01/16/2020    8:48 AM 01/15/2019    8:22 AM 06/07/2018    4:33 PM 05/03/2018   11:02 AM  PHQ 2/9 Scores  PHQ - 2 Score 0 0 0 0 0 0 0  PHQ- 9 Score 0          Fall Risk    01/24/2023    8:16 AM 09/21/2022   11:29 AM 02/08/2022    1:45 PM 02/05/2021    2:02 PM 01/16/2020    8:47 AM  Fall Risk   Falls in the past year? 0 0 0 0 0  Number falls in past yr: 0 0 0 0   Injury with Fall? 0 0 0 0   Risk for fall due to : Medication side effect No Fall Risks No Fall Risks No Fall Risks   Follow up Falls prevention discussed;Education provided;Falls evaluation completed Falls evaluation completed Falls evaluation completed Falls evaluation completed     FALL RISK PREVENTION PERTAINING TO THE HOME:  Any stairs in or around the home? Yes  If so, are there any without handrails? No  Home free of loose throw rugs in walkways, pet beds, electrical  cords, etc? Yes  Adequate lighting in your home to reduce risk of falls? Yes   ASSISTIVE DEVICES UTILIZED TO PREVENT FALLS:  Life alert? No  Use of a cane, walker or w/c? No  Grab bars in the bathroom? No  Shower chair or bench in shower? Yes  Elevated  toilet seat or a handicapped toilet? Yes   TIMED UP AND GO:  Was the test performed? No .      Cognitive Function:        01/24/2023    8:17 AM 02/08/2022    1:48 PM  6CIT Screen  What Year? 0 points 0 points  What month? 0 points 0 points  What time? 0 points 0 points  Count back from 20 0 points 0 points  Months in reverse 0 points 0 points  Repeat phrase 0 points 2 points  Total Score 0 points 2 points    Immunizations Immunization History  Administered Date(s) Administered   COVID-19, mRNA, vaccine(Comirnaty)12 years and older 05/27/2022   Fluad Quad(high Dose 65+) 05/22/2019, 05/24/2021   Influenza, High Dose Seasonal PF 07/01/2015, 07/04/2018   Influenza-Unspecified 07/13/2016, 05/27/2022   PFIZER Comirnaty(Gray Top)Covid-19 Tri-Sucrose Vaccine 11/16/2020   PFIZER(Purple Top)SARS-COV-2 Vaccination 08/29/2019, 09/19/2019, 05/11/2020   Pfizer Covid-19 Vaccine Bivalent Booster 74yrs & up 05/24/2021   Pneumococcal Conjugate-13 12/29/2014   Pneumococcal-Unspecified 07/20/2007   Rsv, Bivalent, Protein Subunit Rsvpref,pf Verdis Frederickson) 06/10/2022   Tdap 06/21/2016   Zoster Recombinat (Shingrix) 12/07/2016, 06/09/2017   Zoster, Live 12/28/2009    TDAP status: Up to date  Flu Vaccine status: Up to date  Pneumococcal vaccine status: Up to date  Covid-19 vaccine status: Completed vaccines  Qualifies for Shingles Vaccine? Yes   Zostavax completed Yes   Shingrix Completed?: Yes  Screening Tests Health Maintenance  Topic Date Due   Medicare Annual Wellness (AWV)  02/05/2022   OPHTHALMOLOGY EXAM  05/17/2022   Diabetic kidney evaluation - Urine ACR  09/28/2022   FOOT EXAM  09/28/2022   Pneumonia Vaccine 28+ Years old (2 of 2 - PPSV23 or PCV20) 08/15/2023 (Originally 12/29/2015)   INFLUENZA VACCINE  03/16/2023   HEMOGLOBIN A1C  03/22/2023   Diabetic kidney evaluation - eGFR measurement  09/22/2023   DTaP/Tdap/Td (2 - Td or Tdap) 06/21/2026   COVID-19 Vaccine   Completed   Hepatitis C Screening  Completed   Zoster Vaccines- Shingrix  Completed   HPV VACCINES  Aged Out   Colonoscopy  Discontinued    Health Maintenance  Health Maintenance Due  Topic Date Due   Medicare Annual Wellness (AWV)  02/05/2022   OPHTHALMOLOGY EXAM  05/17/2022   Diabetic kidney evaluation - Urine ACR  09/28/2022   FOOT EXAM  09/28/2022    Colorectal cancer screening: No longer required.   Lung Cancer Screening: (Low Dose CT Chest recommended if Age 67-80 years, 30 pack-year currently smoking OR have quit w/in 15years.) does not qualify.   Lung Cancer Screening Referral: no  Additional Screening:  Hepatitis C Screening: does qualify; Completed 05/18/2020  Vision Screening: Recommended annual ophthalmology exams for early detection of glaucoma and other disorders of the eye. Is the patient up to date with their annual eye exam?  Yes  Who is the provider or what is the name of the office in which the patient attends annual eye exams? [facility] If pt is not established with a provider, would they like to be referred to a provider to establish care? No .   Dental Screening: Recommended annual dental exams  for proper oral hygiene  Community Resource Referral / Chronic Care Management: CRR required this visit?  No   CCM required this visit?  No      Plan:     I have personally reviewed and noted the following in the patient's chart:   Medical and social history Use of alcohol, tobacco or illicit drugs  Current medications and supplements including opioid prescriptions. Patient is not currently taking opioid prescriptions. Functional ability and status Nutritional status Physical activity Advanced directives List of other physicians Hospitalizations, surgeries, and ER visits in previous 12 months Vitals Screenings to include cognitive, depression, and falls Referrals and appointments  In addition, I have reviewed and discussed with patient certain  preventive protocols, quality metrics, and best practice recommendations. A written personalized care plan for preventive services as well as general preventive health recommendations were provided to patient.     Barb Merino, LPN   1/61/0960   Nurse Notes: none  Due to this being a virtual visit, the after visit summary with patients personalized plan was offered to patient via mail or my-chart. to pick up at office at next visit

## 2023-03-01 ENCOUNTER — Ambulatory Visit (INDEPENDENT_AMBULATORY_CARE_PROVIDER_SITE_OTHER): Payer: Medicare Other | Admitting: Family Medicine

## 2023-03-01 ENCOUNTER — Encounter: Payer: Self-pay | Admitting: Family Medicine

## 2023-03-01 VITALS — BP 124/80 | HR 71 | Temp 97.7°F | Resp 16 | Wt 160.0 lb

## 2023-03-01 DIAGNOSIS — I152 Hypertension secondary to endocrine disorders: Secondary | ICD-10-CM

## 2023-03-01 DIAGNOSIS — G629 Polyneuropathy, unspecified: Secondary | ICD-10-CM

## 2023-03-01 DIAGNOSIS — N400 Enlarged prostate without lower urinary tract symptoms: Secondary | ICD-10-CM | POA: Diagnosis not present

## 2023-03-01 DIAGNOSIS — E1169 Type 2 diabetes mellitus with other specified complication: Secondary | ICD-10-CM | POA: Diagnosis not present

## 2023-03-01 DIAGNOSIS — E118 Type 2 diabetes mellitus with unspecified complications: Secondary | ICD-10-CM | POA: Diagnosis not present

## 2023-03-01 DIAGNOSIS — E1159 Type 2 diabetes mellitus with other circulatory complications: Secondary | ICD-10-CM

## 2023-03-01 DIAGNOSIS — E785 Hyperlipidemia, unspecified: Secondary | ICD-10-CM

## 2023-03-01 DIAGNOSIS — Z86010 Personal history of colonic polyps: Secondary | ICD-10-CM

## 2023-03-01 DIAGNOSIS — R972 Elevated prostate specific antigen [PSA]: Secondary | ICD-10-CM | POA: Diagnosis not present

## 2023-03-01 DIAGNOSIS — Z981 Arthrodesis status: Secondary | ICD-10-CM

## 2023-03-01 DIAGNOSIS — Z860101 Personal history of adenomatous and serrated colon polyps: Secondary | ICD-10-CM

## 2023-03-01 LAB — POCT GLYCOSYLATED HEMOGLOBIN (HGB A1C): Hemoglobin A1C: 8.5 % — AB (ref 4.0–5.6)

## 2023-03-01 MED ORDER — ATORVASTATIN CALCIUM 40 MG PO TABS: 40.0000 mg | ORAL_TABLET | Freq: Every day | ORAL | 3 refills | Status: DC

## 2023-03-01 MED ORDER — EMPAGLIFLOZIN 25 MG PO TABS
25.0000 mg | ORAL_TABLET | Freq: Every day | ORAL | 1 refills | Status: DC
Start: 2023-03-01 — End: 2023-09-04

## 2023-03-01 MED ORDER — LISINOPRIL 20 MG PO TABS: 20.0000 mg | ORAL_TABLET | Freq: Every day | ORAL | 3 refills | Status: DC

## 2023-03-01 MED ORDER — METFORMIN HCL 1000 MG PO TABS
ORAL_TABLET | ORAL | 1 refills | Status: DC
Start: 2023-03-01 — End: 2023-10-11

## 2023-03-01 NOTE — Progress Notes (Signed)
Subjective:    Patient ID: Scott Parks, male    DOB: 1941/07/01, 82 y.o.   MRN: [id number]  Scott Parks is a 82 y.o. male who presents for follow-up of Type 2 diabetes mellitus.  Home blood sugar records:  random  glucose average between 118-213 Current symptoms/problems include  numbness of right leg  and have been stable. Daily foot checks: yes   Any foot concerns: no How often blood sugars checked: 5 times a week Exercise: Home exercise routine includes walking. Gym/ health club routine includes stationary bike. Diet: well balanced  He continues on metformin as well as [place] and having no difficulty with that.  He is also taking lisinopril as well as atorvastatin.  He keeps himself in very good shape.  He has an eye exam set up for this fall.  The neuropathy continues to intermittently bother him and he is trying to work with the [place] concerning his agent orange exposure.  PSA is followed by urology and they are in a holding pattern.  He does have a history of colonic polyp but is not scheduled for any follow-up colonoscopy as he is almost 82 The following portions of the patient's history were reviewed and updated as appropriate: allergies, current medications, past medical history, past social history and problem list.  ROS as in subjective above.     Objective:    Physical Exam Alert and in no distress. Tympanic membranes and canals are normal. Pharyngeal area is normal. Neck is supple without adenopathy or thyromegaly. Cardiac exam shows a regular sinus rhythm without murmurs or gallops. Lungs are clear to auscultation. Foot exam is normal. Hemoglobin A1c is 8.5 Blood pressure 124/80, pulse 71, temperature 97.7 F (36.5 C), temperature source Oral, resp. rate 16, weight 160 lb (72.6 kg), SpO2 98%.  Lab Review    Latest Ref Rng & Units 09/21/2022   12:14 PM 09/21/2022   11:43 AM 02/08/2022    2:59 PM 02/08/2022    2:54 PM 09/28/2021   11:59 AM  Diabetic Labs  HbA1c 4.0 - 5.6 %   8.4  8.3     Microalbumin mg/L     8.8   Micro/Creat Ratio      11.0   Chol 100 - 199 mg/dL 829       HDL >56 mg/dL 75       Calc LDL 0 - 99 mg/dL 74       Triglycerides 0 - 149 mg/dL 78       Creatinine 2.13 - 1.27 mg/dL 0.86    5.78        4/69/6295    9:10 AM 01/24/2023    8:11 AM 12/13/2022   12:56 PM 09/21/2022   11:31 AM 02/08/2022    1:53 PM  BP/Weight  Systolic BP 124 --  130 112  Diastolic BP 80 --  78 70  Wt. (Lbs) 160 160 160 160.2 160.6  BMI 24.33 kg/m2 24.33 kg/m2 24.87 kg/m2 24.9 kg/m2 24.97 kg/m2      Latest Ref Rng & Units 09/28/2021    9:15 AM 05/17/2021   12:00 AM  Foot/eye exam completion dates  Eye Exam No Retinopathy  No Retinopathy      Foot Form Completion  Done      This result is from an external source.    Scott Parks  reports that he quit smoking about 38 years ago. His smoking use included cigarettes. He has never used smokeless tobacco. He reports current alcohol use. He  reports that he does not use drugs.     Assessment & Plan:    Type 2 diabetes mellitus with complication, without long-term current use of insulin (HCC) - Plan: POCT glycosylated hemoglobin (Hb A1C)  S/P lumbar fusion  Neuropathy  Hypertension associated with diabetes (HCC)  Hyperlipidemia associated with type 2 diabetes mellitus (HCC)  Hx of adenomatous polyp of colon  Elevated PSA  Benign prostatic hyperplasia without lower urinary tract symptoms He will continue on his present medication regimen except for Farxiga.  His insurance will not cover that and I will therefore switch him to [place].  Did talk about using a GLP-1 but he would like to hold off on that to see if he can get this under control by just increasing the SGLT.

## 2023-04-13 DIAGNOSIS — Z23 Encounter for immunization: Secondary | ICD-10-CM | POA: Diagnosis not present

## 2023-07-18 ENCOUNTER — Ambulatory Visit (INDEPENDENT_AMBULATORY_CARE_PROVIDER_SITE_OTHER): Payer: Medicare Other | Admitting: Family Medicine

## 2023-07-18 ENCOUNTER — Encounter: Payer: Self-pay | Admitting: Family Medicine

## 2023-07-18 VITALS — BP 120/76 | HR 79 | Ht 68.0 in | Wt 158.8 lb

## 2023-07-18 DIAGNOSIS — E785 Hyperlipidemia, unspecified: Secondary | ICD-10-CM | POA: Diagnosis not present

## 2023-07-18 DIAGNOSIS — I152 Hypertension secondary to endocrine disorders: Secondary | ICD-10-CM | POA: Diagnosis not present

## 2023-07-18 DIAGNOSIS — E118 Type 2 diabetes mellitus with unspecified complications: Secondary | ICD-10-CM | POA: Diagnosis not present

## 2023-07-18 DIAGNOSIS — E1159 Type 2 diabetes mellitus with other circulatory complications: Secondary | ICD-10-CM | POA: Diagnosis not present

## 2023-07-18 DIAGNOSIS — E1169 Type 2 diabetes mellitus with other specified complication: Secondary | ICD-10-CM | POA: Diagnosis not present

## 2023-07-18 LAB — HEMOGLOBIN A1C
Est. average glucose Bld gHb Est-mCnc: 186 mg/dL
Hgb A1c MFr Bld: 8.1 % — ABNORMAL HIGH (ref 4.8–5.6)

## 2023-07-18 NOTE — Progress Notes (Signed)
  Subjective:    Patient ID: Scott Parks, male    DOB: [birth year], 82 y.o.   MRN: [id number]  Scott Parks is a 82 y.o. male who presents for follow-up of Type 2 diabetes mellitus.  Patient is checking home blood sugars.   Home blood sugar records: BGs range between 118 and 192 How often is blood sugars being checked: Every other day, fasting and non fasting  Current symptoms/problems include none and have been unchanged. Daily foot checks: yes   Any foot concerns: none Last eye exam: January 2024 Exercise: Home exercise routine includes walking 5 hrs per week. Gym/ health club routine includes cardio, mod to heavy weightlifting, stationary bike, and treadmill. He continues on Jardiance and Glucophage and is also taking Lipitor and Zestril.  He is having no difficulty with any of these medications. The following portions of the patient's history were reviewed and updated as appropriate: allergies, current medications, past medical history, past social history and problem list.  ROS as in subjective above.     Objective:    Physical Exam Alert and in no distress otherwise not examined.   Lab Review    Latest Ref Rng & Units 03/01/2023    9:25 AM 09/21/2022   12:14 PM 09/21/2022   11:43 AM 02/08/2022    2:59 PM 02/08/2022    2:54 PM  Diabetic Labs  HbA1c 4.0 - 5.6 % 8.5   8.4  8.3    Chol 100 - 199 mg/dL  191      HDL >47 mg/dL  75      Calc LDL 0 - 99 mg/dL  74      Triglycerides 0 - 149 mg/dL  78      Creatinine 8.29 - 1.27 mg/dL  5.62    1.30       86/12/7844   10:35 AM 03/01/2023    9:10 AM 01/24/2023    8:11 AM 12/13/2022   12:56 PM 09/21/2022   11:31 AM  BP/Weight  Systolic BP 120 124 --  130  Diastolic BP 76 80 --  78  Wt. (Lbs) 158.8 160 160 160 160.2  BMI 24.15 kg/m2 24.33 kg/m2 24.33 kg/m2 24.87 kg/m2 24.9 kg/m2      03/01/2023    9:15 AM 09/28/2021    9:15 AM  Foot/eye exam completion dates  Foot Form Completion Done Done    Scott Parks  reports that he quit  smoking about 38 years ago. His smoking use included cigarettes. He has never used smokeless tobacco. He reports current alcohol use. He reports that he does not use drugs.     Assessment & Plan:    Type 2 diabetes mellitus with complication, without long-term current use of insulin (HCC) - Plan: Hemoglobin A1c  Hypertension associated with diabetes (HCC)  Hyperlipidemia associated with type 2 diabetes mellitus (HCC) Encouraged him to continue with his present medication regimen and stay physically active which she always does.

## 2023-09-04 ENCOUNTER — Other Ambulatory Visit: Payer: Self-pay | Admitting: Family Medicine

## 2023-09-04 DIAGNOSIS — E118 Type 2 diabetes mellitus with unspecified complications: Secondary | ICD-10-CM

## 2023-09-12 ENCOUNTER — Telehealth: Payer: Self-pay

## 2023-09-12 NOTE — Progress Notes (Signed)
Care Guide Pharmacy Note  09/12/2023 Name: Scott Parks MRN: [id number] DOB: 09/06/1940  Referred By: Scott Nian, MD Reason for referral: Care Coordination (TNM Diabetes. )   Scott Parks is a 83 y.o. year old male who is a primary care patient of Scott Parks, Scott All, MD.  Scott Parks was referred to the pharmacist for assistance related to: DMII  An unsuccessful telephone outreach was attempted today to contact the patient who was referred to the pharmacy team for assistance with diabetes. Additional attempts will be made to contact the patient.  [facility]  [facility], [facility] Health Care Management Assistant Direct Dial: [phone]  Fax: [phone]

## 2023-09-12 NOTE — Progress Notes (Signed)
Care Guide Pharmacy Note  09/12/2023 Name: Boluwatife Flight MRN: [id number] DOB: 1941-07-31  Referred By: Ronnald Nian, MD Reason for referral: Care Coordination (TNM Diabetes. )   Adrik Khim is a 83 y.o. year old male who is a primary care patient of Susann Givens, Everardo All, MD.  Aston Lieske was referred to the pharmacist for assistance related to: DMII  Successful contact was made with the patient to discuss pharmacy services.  Patient declines engagement at this time. Contact information was provided to the patient should they wish to reach out for assistance at a later time.  [facility]  [facility], [facility] Health Care Management Assistant Direct Dial: [phone]  Fax: [phone]

## 2023-09-22 ENCOUNTER — Telehealth: Payer: Self-pay

## 2023-09-22 DIAGNOSIS — E118 Type 2 diabetes mellitus with unspecified complications: Secondary | ICD-10-CM

## 2023-09-22 NOTE — Progress Notes (Signed)
 09/22/2023 Name: Scott Parks MRN: [id number] DOB: [birth year]  Chief Complaint  Patient presents with   Diabetes   Medication Management    Scott Parks is a 83 y.o. year old male who presented for a telephone visit.   They were referred to the pharmacist for assistance in managing diabetes.    Subjective:  Care Team: Primary Care Provider: Joyce Norleen BROCKS, MD ; Next Scheduled Visit: 02/29/24  Medication Access/Adherence  Current Pharmacy:  [facility] - [address] [place] - [address] [place] [address] Phone: [phone] Fax: [phone]  [facility] [address] [place] - [address] [place] [address] Phone: [phone] Fax: [phone]   Patient reports affordability concerns with their medications: No  Patient reports access/transportation concerns to their pharmacy: No  Patient reports adherence concerns with their medications:  No     Diabetes:  Current medications: Jardiance  25mg , Metformin  1000mg  BID Medications tried in the past: Farxiga , Januvia   Current glucose readings:  Checks 4-5/week at varying times of day, reports readings have improved since his last visit with PCP and are mostly in range with a couple of excursions above range 2-3x/month   Patient denies hypoglycemic s/sx including dizziness, shakiness, sweating. Patient denies hyperglycemic symptoms including polyuria, polydipsia, polyphagia, nocturia, neuropathy, blurred vision.  Current meal patterns:  Has worked on eating better and is staying mindful of carbs since last PCP visit  Current physical activity: Goes to gym regularly and is active on a daily basis  Current medication access support: None   Objective:  Lab Results  Component Value Date   HGBA1C 8.1 (H) 07/18/2023    Lab Results  Component Value Date   CREATININE 1.01 09/21/2022   BUN 19 09/21/2022   NA 137 09/21/2022    K 5.1 09/21/2022   CL 98 09/21/2022   CO2 25 09/21/2022    Lab Results  Component Value Date   CHOL 164 09/21/2022   HDL 75 09/21/2022   LDLCALC 74 09/21/2022   TRIG 78 09/21/2022   CHOLHDL 2.2 09/21/2022    Medications Reviewed Today     Reviewed by Lionell Jon DEL, RPH (Pharmacist) on 09/22/23 at 1454  Med List Status: <None>   Medication Order Taking? Sig Documenting Provider Last Dose Status Informant  atorvastatin  (LIPITOR) 40 MG tablet [id number] Yes Take 1 tablet (40 mg total) by mouth daily. Joyce Norleen BROCKS, MD Taking Active   empagliflozin  (JARDIANCE ) 25 MG TABS tablet [id number] Yes TAKE 1 TABLET DAILY Lalonde, John C, MD Taking Active   Ferrous Sulfate (IRON  PO) [id number] Yes Take by mouth. [provider] Taking Active   glucose blood (ONETOUCH VERIO) test strip [id number]  Use as instructed Lalonde, John C, MD  Active Self  lisinopril  (ZESTRIL ) 20 MG tablet [id number] Yes Take 1 tablet (20 mg total) by mouth daily. Joyce Norleen BROCKS, MD Taking Active   metFORMIN  (GLUCOPHAGE ) 1000 MG tablet [id number] Yes TAKE 1 TABLET TWICE A DAY WITH MEALS Joyce Norleen BROCKS, MD Taking Active   Pediatric Multivitamins-Iron  (MULTI-DELYN/IRON  PO) [id number] Yes Take 2 tablets by mouth daily.  [provider] Taking Active Self  vitamin C  (ASCORBIC ACID ) 500 MG tablet [id number] Yes Take 1,000 mg by mouth daily. [provider] Taking Active Self              Assessment/Plan:   Diabetes: - Currently uncontrolled at last A1C check - Reviewed long term cardiovascular and renal  outcomes of uncontrolled blood sugar - Reviewed goal A1c, goal fasting, and goal 2 hour post prandial glucose - Reviewed dietary modifications including low carb diet - Reviewed lifestyle modifications including: continued exercise - Recommend to continue current medication therapy  - Patient denies personal or family history of multiple endocrine neoplasia type 2, medullary thyroid   cancer; personal history of pancreatitis or gallbladder disease. - Recommend to check glucose once daily -Future consideration: If A1C still elevated at next check, could try GLP-1   Follow Up Plan: 2 months  Jon VEAR Lindau, PharmD Clinical Pharmacist [phone]

## 2023-10-10 ENCOUNTER — Encounter: Payer: Self-pay | Admitting: Internal Medicine

## 2023-10-11 ENCOUNTER — Other Ambulatory Visit: Payer: Self-pay | Admitting: Family Medicine

## 2023-10-11 DIAGNOSIS — E118 Type 2 diabetes mellitus with unspecified complications: Secondary | ICD-10-CM

## 2023-11-01 DIAGNOSIS — H52223 Regular astigmatism, bilateral: Secondary | ICD-10-CM | POA: Diagnosis not present

## 2023-11-01 DIAGNOSIS — E119 Type 2 diabetes mellitus without complications: Secondary | ICD-10-CM | POA: Diagnosis not present

## 2023-11-01 DIAGNOSIS — H5203 Hypermetropia, bilateral: Secondary | ICD-10-CM | POA: Diagnosis not present

## 2023-11-01 DIAGNOSIS — H524 Presbyopia: Secondary | ICD-10-CM | POA: Diagnosis not present

## 2023-11-01 LAB — HM DIABETES EYE EXAM

## 2023-11-09 ENCOUNTER — Encounter: Payer: Self-pay | Admitting: Family Medicine

## 2023-11-23 ENCOUNTER — Other Ambulatory Visit: Payer: Medicare Other

## 2023-11-23 DIAGNOSIS — E118 Type 2 diabetes mellitus with unspecified complications: Secondary | ICD-10-CM

## 2023-11-23 NOTE — Progress Notes (Signed)
 11/23/2023 Name: Scott Parks MRN: [id number] DOB: 03-23-1941  Chief Complaint  Patient presents with   Diabetes   Medication Management    Scott Parks is a 83 y.o. year old male who presented for a telephone visit.   They were referred to the pharmacist for assistance in managing diabetes.    Subjective:  Care Team: Primary Care Provider: Ronnald Nian, MD ; Next Scheduled Visit: 02/29/24  Medication Access/Adherence  Current Pharmacy:  [facility] DELIV[facility] - [address], [place] - [address] [place] [address] Phone: [phone] Fax: [phone]  [facility] [address], [place] - [address] [place] [address] Phone: [phone] Fax: [phone]   Patient reports affordability concerns with their medications: No  Patient reports access/transportation concerns to their pharmacy: No  Patient reports adherence concerns with their medications:  No     Diabetes:  Current medications: Jardiance 25mg , Metformin 1000mg  BID Medications tried in the past: Farxiga, Januvia  Current glucose readings:  Checks 4-5/week at varying times of day, reports readings have improved since his last visit with PCP and are mostly in range with a couple of excursions above range 2-3x/month   Patient denies hypoglycemic s/sx including dizziness, shakiness, sweating. Patient denies hyperglycemic symptoms including polyuria, polydipsia, polyphagia, nocturia, neuropathy, blurred vision.  Current meal patterns:  Has worked on eating better and is staying mindful of carbs since last PCP visit  Current physical activity: Goes to gym regularly and is active on a daily basis  Current medication access support: None   Objective:  Lab Results  Component Value Date   HGBA1C 8.1 (H) 07/18/2023    Lab Results  Component Value Date   CREATININE 1.01 09/21/2022   BUN 19 09/21/2022   NA 137  09/21/2022   K 5.1 09/21/2022   CL 98 09/21/2022   CO2 25 09/21/2022    Lab Results  Component Value Date   CHOL 164 09/21/2022   HDL 75 09/21/2022   LDLCALC 74 09/21/2022   TRIG 78 09/21/2022   CHOLHDL 2.2 09/21/2022    Medications Reviewed Today     Reviewed by Sherrill Raring, RPH (Pharmacist) on 11/23/23 at 1601  Med List Status: <None>   Medication Order Taking? Sig Documenting Provider Last Dose Status Informant  atorvastatin (LIPITOR) 40 MG tablet [id number] Yes Take 1 tablet (40 mg total) by mouth daily. Ronnald Nian, MD Taking Active   empagliflozin (JARDIANCE) 25 MG TABS tablet [id number] Yes TAKE 1 TABLET DAILY Ronnald Nian, MD Taking Active   Ferrous Sulfate (IRON PO) [id number]  Take by mouth. [provider]  Active   glucose blood (ONETOUCH VERIO) test strip [id number]  Use as instructed Ronnald Nian, MD  Active Self  lisinopril (ZESTRIL) 20 MG tablet [id number] Yes Take 1 tablet (20 mg total) by mouth daily. Ronnald Nian, MD Taking Active   metFORMIN (GLUCOPHAGE) 1000 MG tablet [id number] Yes TAKE 1 TABLET TWICE A DAY WITH MEALS Ronnald Nian, MD Taking Active   Pediatric Multivitamins-Iron (MULTI-DELYN/IRON PO) [id number]  Take 2 tablets by mouth daily.  [provider]  Active Self  vitamin C (ASCORBIC ACID) 500 MG tablet [id number] Yes Take 1,000 mg by mouth daily. [provider] Taking Active Self              Assessment/Plan:   Diabetes: - Currently uncontrolled at last A1C check - Reviewed long term cardiovascular and renal  outcomes of uncontrolled blood sugar - Reviewed goal A1c, goal fasting, and goal 2 hour post prandial glucose - Reviewed dietary modifications including low carb diet - Reviewed lifestyle modifications including: continued exercise - Recommend to continue current medication therapy  - Patient denies personal or family history of multiple endocrine neoplasia type 2, medullary thyroid cancer;  personal history of pancreatitis or gallbladder disease. - Recommend to check glucose once daily at varying times -Future consideration: If A1C still elevated at next check, could try GLP-1   Follow Up Plan: 2 months with PCP, follow up after as needed  Sherrill Raring, PharmD Clinical Pharmacist [phone]

## 2024-01-30 ENCOUNTER — Ambulatory Visit: Payer: Medicare Other

## 2024-01-30 DIAGNOSIS — Z0000 Encounter for general adult medical examination without abnormal findings: Secondary | ICD-10-CM | POA: Diagnosis not present

## 2024-01-30 NOTE — Patient Instructions (Addendum)
 Mr. Scott Parks , Thank you for taking time out of your busy schedule to complete your Annual Wellness Visit with me. I enjoyed our conversation and look forward to speaking with you again next year. I, as well as your care team,  appreciate your ongoing commitment to your health goals. Please review the following plan we discussed and let me know if I can assist you in the future. Your Game plan/ To Do List    Referrals: If you haven't heard from the office you've been referred to, please reach out to them at the phone provided.  N/a Follow up Visits: Next Medicare AWV with our clinical staff: 02/04/2025 at 8:10   Have you seen your provider in the last 6 months (3 months if uncontrolled diabetes)? No Next Office Visit with your provider: 02/29/2024 at 11:00  Clinician Recommendations:  Aim for 30 minutes of exercise or brisk walking, 6-8 glasses of water, and 5 servings of fruits and vegetables each day.       This is a list of the screening recommended for you and due dates:  Health Maintenance  Topic Date Due   Pneumococcal Vaccine for age over 36 (2 of 2 - PPSV23) 02/23/2015   Yearly kidney health urinalysis for diabetes  09/28/2022   Yearly kidney function blood test for diabetes  09/22/2023   COVID-19 Vaccine (8 - 2024-25 season) 10/13/2023   Hemoglobin A1C  01/16/2024   Complete foot exam   02/29/2024   Flu Shot  03/15/2024   Eye exam for diabetics  10/31/2024   Medicare Annual Wellness Visit  01/29/2025   DTaP/Tdap/Td vaccine (2 - Td or Tdap) 06/21/2026   Zoster (Shingles) Vaccine  Completed   HPV Vaccine  Aged Out   Meningitis B Vaccine  Aged Out   Colon Cancer Screening  Discontinued   Hepatitis C Screening  Discontinued    Advanced directives: (Copy Requested) Please bring a copy of your health care power of attorney and living will to the office to be added to your chart at your convenience. You can mail to [facility] [address] [place], [place] [address]  or email to [email or web address] Advance Care Planning is important because it:  [x]  Makes sure you receive the medical care that is consistent with your values, goals, and preferences  [x]  It provides guidance to your family and loved ones and reduces their decisional burden about whether or not they are making the right decisions based on your wishes.  Follow the link provided in your after visit summary or read over the paperwork we have mailed to you to help you started getting your Advance Directives in place. If you need assistance in completing these, please reach out to us  so that we can help you!  See attachments for Preventive Care and Fall Prevention Tips.

## 2024-01-30 NOTE — Progress Notes (Signed)
 Subjective:   Scott Parks is a 83 y.o. who presents for a Medicare Wellness preventive visit.  As a reminder, Annual Wellness Visits don't include a physical exam, and some assessments may be limited, especially if this visit is performed virtually. We may recommend an in-person follow-up visit with your provider if needed.  Visit Complete: Virtual I connected with  Scott Parks on 01/30/24 by a audio enabled telemedicine application and verified that I am speaking with the correct person using two identifiers.  Patient Location: Home  Provider Location: Office/Clinic  I discussed the limitations of evaluation and management by telemedicine. The patient expressed understanding and agreed to proceed.  Vital Signs: Because this visit was a virtual/telehealth visit, some criteria may be missing or patient reported. Any vitals not documented were not able to be obtained and vitals that have been documented are patient reported.  VideoError- Librarian, academic were attempted between this provider and patient, however failed, due to patient having technical difficulties OR patient did not have access to video capability.  We continued and completed visit with audio only.   Persons Participating in Visit: Patient.  AWV Questionnaire: No: Patient Medicare AWV questionnaire was not completed prior to this visit.  Cardiac Risk Factors include: advanced age (>74men, >72 women);diabetes mellitus;dyslipidemia;hypertension;male gender     Objective:    Today's Vitals   There is no height or weight on file to calculate BMI.     01/30/2024    8:12 AM 01/24/2023    8:16 AM 02/08/2022    1:45 PM 02/05/2021    2:02 PM 01/16/2020    9:06 AM 01/15/2019    8:49 AM 11/30/2018    2:30 PM  Advanced Directives  Does Patient Have a Medical Advance Directive? Yes Yes Yes Yes Yes Yes Yes  Type of Estate agent of [place];Living will Living will Living  will;Healthcare Power of Attorney Living will Healthcare Power of [place];Living will Healthcare Power of [place];Living will Healthcare Power of Attorney  Does patient want to make changes to medical advance directive?   No - Patient declined No - Patient declined No - Patient declined No - Patient declined  No - Patient declined   Copy of Healthcare Power of Attorney in Chart? No - copy requested  No - copy requested  No - copy requested No - copy requested  No - copy requested      Data saved with a previous flowsheet row definition    Current Medications (verified) Outpatient Encounter Medications as of 01/30/2024  Medication Sig   atorvastatin  (LIPITOR) 40 MG tablet Take 1 tablet (40 mg total) by mouth daily.   empagliflozin  (JARDIANCE ) 25 MG TABS tablet TAKE 1 TABLET DAILY   Ferrous Sulfate (IRON  PO) Take by mouth.   glucose blood (ONETOUCH VERIO) test strip Use as instructed   lisinopril  (ZESTRIL ) 20 MG tablet Take 1 tablet (20 mg total) by mouth daily.   metFORMIN  (GLUCOPHAGE ) 1000 MG tablet TAKE 1 TABLET TWICE A DAY WITH MEALS   Pediatric Multivitamins-Iron  (MULTI-DELYN/IRON  PO) Take 2 tablets by mouth daily.    vitamin C  (ASCORBIC ACID ) 500 MG tablet Take 1,000 mg by mouth daily.   No facility-administered encounter medications on file as of 01/30/2024.    Allergies (verified) Patient has no known allergies.   History: Past Medical History:  Diagnosis Date   Arthritis    Diabetes mellitus without complication (HCC)    Type II   Hx of adenomatous polyp of  colon 11/29/2017   Hyperlipidemia    Hypertension    Past Surgical History:  Procedure Laterality Date   COLONOSCOPY     HAND SURGERY Right    KNEE ARTHROSCOPY Left    LUMBAR LAMINECTOMY/DECOMPRESSION MICRODISCECTOMY Right 04/07/2016   Procedure: hemilaminectomy  - Lumbar tow-three - Lumbar three-four - right;  Surgeon: Isadora Mar, MD;  Location: [facility];  Service: Neurosurgery;  Laterality: Right;   MAXIMUM  ACCESS (MAS)POSTERIOR LUMBAR INTERBODY FUSION (PLIF) 1 LEVEL N/A 04/07/2016   Procedure: lumbar two-three, three-four, four-five laminectomy lumbar four-five fusion with pedicle screws (PLIF without interbodies);  Surgeon: Isadora Mar, MD;  Location: [facility];  Service: Neurosurgery;  Laterality: N/A;   TRIGGER FINGER RELEASE Left    ring finger   Family History  Problem Relation Age of Onset   Colon polyps Neg Hx    Colon cancer Neg Hx    Esophageal cancer Neg Hx    Rectal cancer Neg Hx    Stomach cancer Neg Hx    Social History   Socioeconomic History   Marital status: Married    Spouse name: Not on file   Number of children: Not on file   Years of education: Not on file   Highest education level: Not on file  Occupational History   Not on file  Tobacco Use   Smoking status: Former    Current packs/day: 0.00    Types: Cigarettes    Quit date: 08/15/1984    Years since quitting: 39.4   Smokeless tobacco: Never   Tobacco comments:    Quit in 1986  Vaping Use   Vaping status: Never Used  Substance and Sexual Activity   Alcohol use: Yes    Comment: occasionally   Drug use: No   Sexual activity: Yes  Other Topics Concern   Not on file  Social History Narrative   Not on file   Social Drivers of Health   Financial Resource Strain: Low Risk  (01/30/2024)   Overall Financial Resource Strain (CARDIA)    Difficulty of Paying Living Expenses: Not hard at all  Food Insecurity: No Food Insecurity (01/30/2024)   Hunger Vital Sign    Worried About Running Out of Food in the Last Year: Never true    Ran Out of Food in the Last Year: Never true  Transportation Needs: No Transportation Needs (01/30/2024)   PRAPARE - Administrator, Civil Service (Medical): No    Lack of Transportation (Non-Medical): No  Physical Activity: Sufficiently Active (01/30/2024)   Exercise Vital Sign    Days of Exercise per Week: 4 days    Minutes of Exercise per Session: 60 min  Stress:  Stress Concern Present (01/30/2024)   [employer] of Occupational Health - Occupational Stress Questionnaire    Feeling of Stress: To some extent  Social Connections: Socially Integrated (01/30/2024)   Social Connection and Isolation Panel    Frequency of Communication with Friends and Family: More than three times a week    Frequency of Social Gatherings with Friends and Family: More than three times a week    Attends Religious Services: More than 4 times per year    Active Member of [employer] or Organizations: Yes    Attends Engineer, structural: More than 4 times per year    Marital Status: Married    Tobacco Counseling Counseling given: Not Answered Tobacco comments: Quit in 1986    Clinical Intake:  Pre-visit preparation completed:  Yes  Pain : No/denies pain     Nutritional Risks: None Diabetes: Yes CBG done?: No Did pt. bring in CBG monitor from home?: No  Lab Results  Component Value Date   HGBA1C 8.1 (H) 07/18/2023   HGBA1C 8.5 (A) 03/01/2023   HGBA1C 8.4 (A) 09/21/2022     How often do you need to have someone help you when you read instructions, pamphlets, or other written materials from your doctor or pharmacy?: 1 - Never  Interpreter Needed?: No  Information entered by :: NAllen LPN   Activities of Daily Living     01/30/2024    8:07 AM  In your present state of health, do you have any difficulty performing the following activities:  Hearing? 0  Vision? 0  Difficulty concentrating or making decisions? 0  Walking or climbing stairs? 0  Dressing or bathing? 0  Doing errands, shopping? 0  Preparing Food and eating ? N  Using the Toilet? N  In the past six months, have you accidently leaked urine? N  Do you have problems with loss of bowel control? N  Managing your Medications? N  Managing your Finances? N  Housekeeping or managing your Housekeeping? N    Patient Care Team: Watson Hacking, MD as PCP - General (Family  Medicine) Bartley Lightning, MD as Consulting Physician (Cardiothoracic Surgery) Carnell Christian, [facility] (Pharmacist)  I have updated your Care Teams any recent Medical Services you may have received from other providers in the past year.     Assessment:   This is a routine wellness examination for Scott Parks.  Hearing/Vision screen Hearing Screening - Comments:: Denies hearing issues Vision Screening - Comments:: Regular eye exams, [facility]   Goals Addressed             This Visit's Progress    Patient Stated       01/30/2024, wants to gain 10-15 pounds       Depression Screen     01/30/2024    8:13 AM 01/24/2023    8:16 AM 02/08/2022    1:45 PM 09/28/2021    9:11 AM 01/16/2020    8:48 AM 01/15/2019    8:22 AM 06/07/2018    4:33 PM  PHQ 2/9 Scores  PHQ - 2 Score 0 0 0 0 0 0 0  PHQ- 9 Score 0 0         Fall Risk     01/30/2024    8:13 AM 01/24/2023    8:16 AM 09/21/2022   11:29 AM 02/08/2022    1:45 PM 02/05/2021    2:02 PM  Fall Risk   Falls in the past year? 0 0 0 0 0  Number falls in past yr: 0 0 0 0 0  Injury with Fall? 0 0 0 0 0  Risk for fall due to : Medication side effect Medication side effect No Fall Risks No Fall Risks No Fall Risks  Follow up Falls evaluation completed;Falls prevention discussed Falls prevention discussed;Education provided;Falls evaluation completed Falls evaluation completed Falls evaluation completed  Falls evaluation completed      Data saved with a previous flowsheet row definition    MEDICARE RISK AT HOME:  Medicare Risk at Home Any stairs in or around the home?: Yes If so, are there any without handrails?: No Home free of loose throw rugs in walkways, pet beds, electrical cords, etc?: Yes Adequate lighting in your home to reduce risk of falls?: Yes Life alert?: No  Use of a cane, walker or w/c?: No Grab bars in the bathroom?: No Shower chair or bench in shower?: Yes Elevated toilet seat or a handicapped toilet?: Yes  TIMED UP AND  GO:  Was the test performed?  No  Cognitive Function: 6CIT completed        01/30/2024    8:14 AM 01/24/2023    8:17 AM 02/08/2022    1:48 PM  6CIT Screen  What Year? 0 points 0 points 0 points  What month? 0 points 0 points 0 points  What time? 0 points 0 points 0 points  Count back from 20 0 points 0 points 0 points  Months in reverse 0 points 0 points 0 points  Repeat phrase 0 points 0 points 2 points  Total Score 0 points 0 points 2 points    Immunizations Immunization History  Administered Date(s) Administered   Fluad Quad(high Dose 65+) 05/22/2019, 05/24/2021   Influenza, High Dose Seasonal PF 07/01/2015, 07/04/2018   Influenza-Unspecified 07/13/2016, 05/27/2022   PFIZER Comirnaty(Gray Top)Covid-19 Tri-Sucrose Vaccine 11/16/2020   PFIZER(Purple Top)SARS-COV-2 Vaccination 08/29/2019, 09/19/2019, 05/11/2020   Pfizer Covid-19 Vaccine Bivalent Booster 17yrs & up 05/24/2021   Pfizer(Comirnaty)Fall Seasonal Vaccine 12 years and older 05/27/2022   Pneumococcal Conjugate-13 12/29/2014   Pneumococcal-Unspecified 07/20/2007   Rsv, Bivalent, Protein Subunit Rsvpref,pf Pattricia Bores) 06/10/2022   Tdap 06/21/2016   Zoster Recombinant(Shingrix) 12/07/2016, 06/09/2017   Zoster, Live 12/28/2009    Screening Tests Health Maintenance  Topic Date Due   Pneumococcal Vaccine: 50+ Years (2 of 2 - PPSV23) 02/23/2015   Diabetic kidney evaluation - Urine ACR  09/28/2022   COVID-19 Vaccine (7 - 2024-25 season) 04/16/2023   Diabetic kidney evaluation - eGFR measurement  09/22/2023   HEMOGLOBIN A1C  01/16/2024   FOOT EXAM  02/29/2024   INFLUENZA VACCINE  03/15/2024   OPHTHALMOLOGY EXAM  10/31/2024   Medicare Annual Wellness (AWV)  01/29/2025   DTaP/Tdap/Td (2 - Td or Tdap) 06/21/2026   Zoster Vaccines- Shingrix  Completed   HPV VACCINES  Aged Out   Meningococcal B Vaccine  Aged Out   Colonoscopy  Discontinued   Hepatitis C Screening  Discontinued    Health Maintenance  Health  Maintenance Due  Topic Date Due   Pneumococcal Vaccine: 50+ Years (2 of 2 - PPSV23) 02/23/2015   Diabetic kidney evaluation - Urine ACR  09/28/2022   COVID-19 Vaccine (7 - 2024-25 season) 04/16/2023   Diabetic kidney evaluation - eGFR measurement  09/22/2023   HEMOGLOBIN A1C  01/16/2024   Health Maintenance Items Addressed: Due for pneumonia vaccine. A1C, UACR and EGFR due at next appointment on 02/29/2024.  Additional Screening:  Vision Screening: Recommended annual ophthalmology exams for early detection of glaucoma and other disorders of the eye. Would you like a referral to an eye doctor? No    Dental Screening: Recommended annual dental exams for proper oral hygiene  Community Resource Referral / Chronic Care Management: CRR required this visit?  No   CCM required this visit?  No   Plan:    I have personally reviewed and noted the following in the patient's chart:   Medical and social history Use of alcohol, tobacco or illicit drugs  Current medications and supplements including opioid prescriptions. Patient is not currently taking opioid prescriptions. Functional ability and status Nutritional status Physical activity Advanced directives List of other physicians Hospitalizations, surgeries, and ER visits in previous 12 months Vitals Screenings to include cognitive, depression, and falls Referrals and appointments  In addition,  I have reviewed and discussed with patient certain preventive protocols, quality metrics, and best practice recommendations. A written personalized care plan for preventive services as well as general preventive health recommendations were provided to patient.   Areatha Beecham, LPN   11/21/8117   After Visit Summary: (MyChart) Due to this being a telephonic visit, the after visit summary with patients personalized plan was offered to patient via MyChart   Notes: Nothing significant to report at this time.

## 2024-02-29 ENCOUNTER — Encounter: Payer: Self-pay | Admitting: Family Medicine

## 2024-02-29 ENCOUNTER — Ambulatory Visit (INDEPENDENT_AMBULATORY_CARE_PROVIDER_SITE_OTHER): Payer: Medicare Other | Admitting: Family Medicine

## 2024-02-29 VITALS — BP 118/80 | HR 72 | Ht 68.0 in | Wt 152.4 lb

## 2024-02-29 DIAGNOSIS — Z0000 Encounter for general adult medical examination without abnormal findings: Secondary | ICD-10-CM

## 2024-02-29 DIAGNOSIS — Z6379 Other stressful life events affecting family and household: Secondary | ICD-10-CM | POA: Diagnosis not present

## 2024-02-29 DIAGNOSIS — I152 Hypertension secondary to endocrine disorders: Secondary | ICD-10-CM

## 2024-02-29 DIAGNOSIS — E1159 Type 2 diabetes mellitus with other circulatory complications: Secondary | ICD-10-CM | POA: Diagnosis not present

## 2024-02-29 DIAGNOSIS — N400 Enlarged prostate without lower urinary tract symptoms: Secondary | ICD-10-CM | POA: Diagnosis not present

## 2024-02-29 DIAGNOSIS — E785 Hyperlipidemia, unspecified: Secondary | ICD-10-CM | POA: Diagnosis not present

## 2024-02-29 DIAGNOSIS — E1169 Type 2 diabetes mellitus with other specified complication: Secondary | ICD-10-CM

## 2024-02-29 DIAGNOSIS — R972 Elevated prostate specific antigen [PSA]: Secondary | ICD-10-CM

## 2024-02-29 DIAGNOSIS — N5201 Erectile dysfunction due to arterial insufficiency: Secondary | ICD-10-CM

## 2024-02-29 DIAGNOSIS — E118 Type 2 diabetes mellitus with unspecified complications: Secondary | ICD-10-CM

## 2024-02-29 DIAGNOSIS — Z23 Encounter for immunization: Secondary | ICD-10-CM | POA: Diagnosis not present

## 2024-02-29 DIAGNOSIS — Z860101 Personal history of adenomatous and serrated colon polyps: Secondary | ICD-10-CM | POA: Diagnosis not present

## 2024-02-29 LAB — POCT UA - MICROALBUMIN
Albumin/Creatinine Ratio, Urine, POC: 6.5
Albumin/Creatinine Ratio, Urine, POC: 6.5
Creatinine, POC: 84.9 mg/dL
Creatinine, POC: 84.9 mg/dL
Microalbumin Ur, POC: 7.7 mg/L
Microalbumin Ur, POC: 7.7 mg/L

## 2024-02-29 LAB — POCT GLYCOSYLATED HEMOGLOBIN (HGB A1C): Hemoglobin A1C: 8.2 % — AB (ref 4.0–5.6)

## 2024-02-29 LAB — LIPID PANEL

## 2024-02-29 MED ORDER — OZEMPIC (0.25 OR 0.5 MG/DOSE) 2 MG/3ML ~~LOC~~ SOPN: 0.2500 mg | PEN_INJECTOR | SUBCUTANEOUS | 1 refills | Status: DC

## 2024-02-29 NOTE — Patient Instructions (Signed)
 Months on MyChart and let me know how you are doing or sooner if there is any questions

## 2024-02-29 NOTE — Progress Notes (Signed)
 Ca Subjective:    Patient ID: Scott Parks, male    DOB: [birth year], 83 y.o.   MRN: [id number]  Scott Parks is a 83 y.o. male who presents for follow-up of Type 2 diabetes mellitus.  Home blood sugar records: 184 to 98  Current symptoms/problems include none and have been unchanged. Daily foot checks: yes   Any foot concerns: no How often blood sugars checked: 6 times a week Exercise: walking, gym, and biking  Diet: general diet.  He has been dealing with the loss of family members as well as close friends over the last several months.  This is disrupting his total life but he states that he seems to be handling this fairly well.  He does have underlying diabetes and admits to not having a good diet because of all the problems with the death in the family.  He is taking Jardiance  as well as metformin .  Continues on lisinopril .  He is also taking Lipitor and having no difficulty with that.  On his last colonoscopy the gastroenterologist said no follow-up was needed.  He is also comfortable with not pursuing the PSA anymore.  He and his wife are doing quite nicely and have resolved any issues with ED.  The following portions of the patient's history were reviewed and updated as appropriate: allergies, current medications, past medical history, past social history and problem list.  ROS as in subjective above.     Objective:    Physical Exam Alert and in no distress. Tympanic membranes and canals are normal. Pharyngeal area is normal. Neck is supple without adenopathy or thyromegaly. Cardiac exam shows a regular sinus rhythm without murmurs or gallops. Lungs are clear to auscultation.  Diabetic foot exam is normal.  Hemoglobin A1c is 8.2 Blood pressure 118/80, pulse 72, height 5' 8 (1.727 m), weight 152 lb 6.4 oz (69.1 kg), SpO2 99%.  Lab Review    Latest Ref Rng & Units 02/29/2024    3:42 PM 02/29/2024   11:11 AM 02/29/2024   11:10 AM 07/18/2023   11:26 AM 03/01/2023    9:25 AM  Diabetic  Labs  HbA1c 4.0 - 5.6 %  8.2   8.1  8.5   Microalbumin mg/L 7.7   7.7     Micro/Creat Ratio  6.5   6.5         02/29/2024   10:49 AM 01/30/2024    8:06 AM 07/18/2023   10:35 AM 03/01/2023    9:10 AM 01/24/2023    8:11 AM  BP/Weight  Systolic BP 118 -- 120 124 --  Diastolic BP 80 -- 76 80 --  Wt. (Lbs) 152.4 -- 158.8 160 160  BMI 23.17 kg/m2  24.15 kg/m2 24.33 kg/m2 24.33 kg/m2      Latest Ref Rng & Units 11/01/2023    4:18 PM 03/01/2023    9:15 AM  Foot/eye exam completion dates  Eye Exam No Retinopathy No Retinopathy       Foot Form Completion   Done     This result is from an external source.    Scott Parks  reports that he quit smoking about 39 years ago. His smoking use included cigarettes. He has never used smokeless tobacco. He reports current alcohol use. He reports that he does not use drugs.     Assessment & Plan:    Routine general medical examination at a health care facility  Type 2 diabetes mellitus with complication, without long-term current use of insulin  (HCC) - Plan: CBC  with Differential/Platelet, Comprehensive metabolic panel with GFR, Lipid panel, POCT glycosylated hemoglobin (Hb A1C), POCT UA - Microalbumin, POCT UA - Microalbumin, Semaglutide ,0.25 or 0.5MG /DOS, (OZEMPIC , 0.25 OR 0.5 MG/DOSE,) 2 MG/3ML SOPN, DISCONTINUED: Semaglutide ,0.25 or 0.5MG /DOS, (OZEMPIC , 0.25 OR 0.5 MG/DOSE,) 2 MG/3ML SOPN  Hypertension associated with diabetes (HCC) - Plan: CBC with Differential/Platelet, Comprehensive metabolic panel with GFR  Hyperlipidemia associated with type 2 diabetes mellitus (HCC) - Plan: Lipid panel  Erectile dysfunction due to arterial insufficiency  Benign prostatic hyperplasia without lower urinary tract symptoms  Need for vaccination against Streptococcus pneumoniae - Plan: Pneumococcal conjugate vaccine 20-valent (Prevnar 20)  Stressful life events affecting family and household  Elevated PSA  Hx of adenomatous polyp of colon After discussion with  him I think it is reasonable to place him on a GLP to help get this under control.  I will choose Ozempic .  He will let me know if he has any side effects.  I did go over the potential side effects from this.  He is to leave a message on MyChart.  I will increase this based on better control of his diabetes.  He is comfortable with this new approach.

## 2024-03-01 ENCOUNTER — Ambulatory Visit: Payer: Self-pay | Admitting: Family Medicine

## 2024-03-01 LAB — CBC WITH DIFFERENTIAL/PLATELET
Basophils Absolute: 0 x10E3/uL (ref 0.0–0.2)
Basos: 1 %
EOS (ABSOLUTE): 0.3 x10E3/uL (ref 0.0–0.4)
Eos: 5 %
Hematocrit: 43.6 % (ref 37.5–51.0)
Hemoglobin: 13.6 g/dL (ref 13.0–17.7)
Immature Grans (Abs): 0 x10E3/uL (ref 0.0–0.1)
Immature Granulocytes: 0 %
Lymphocytes Absolute: 1.6 x10E3/uL (ref 0.7–3.1)
Lymphs: 26 %
MCH: 25.2 pg — ABNORMAL LOW (ref 26.6–33.0)
MCHC: 31.2 g/dL — ABNORMAL LOW (ref 31.5–35.7)
MCV: 81 fL (ref 79–97)
Monocytes Absolute: 0.5 x10E3/uL (ref 0.1–0.9)
Monocytes: 9 %
Neutrophils Absolute: 3.7 x10E3/uL (ref 1.4–7.0)
Neutrophils: 59 %
Platelets: 227 x10E3/uL (ref 150–450)
RBC: 5.39 x10E6/uL (ref 4.14–5.80)
RDW: 14.8 % (ref 11.6–15.4)
WBC: 6.2 x10E3/uL (ref 3.4–10.8)

## 2024-03-01 LAB — COMPREHENSIVE METABOLIC PANEL WITH GFR
ALT: 11 IU/L (ref 0–44)
AST: 13 IU/L (ref 0–40)
Albumin: 4.5 g/dL (ref 3.8–4.8)
Alkaline Phosphatase: 103 IU/L (ref 44–121)
BUN/Creatinine Ratio: 21 (ref 10–24)
BUN: 23 mg/dL (ref 8–27)
Bilirubin Total: 0.4 mg/dL (ref 0.0–1.2)
CO2: 22 mmol/L (ref 20–29)
Calcium: 9.8 mg/dL (ref 8.6–10.2)
Chloride: 97 mmol/L (ref 96–106)
Creatinine, Ser: 1.07 mg/dL (ref 0.76–1.27)
Globulin, Total: 2.2 g/dL (ref 1.5–4.5)
Glucose: 152 mg/dL — ABNORMAL HIGH (ref 70–99)
Potassium: 4.7 mmol/L (ref 3.5–5.2)
Sodium: 135 mmol/L (ref 134–144)
Total Protein: 6.7 g/dL (ref 6.0–8.5)
eGFR: 70 mL/min/1.73 (ref >59)

## 2024-03-01 LAB — LIPID PANEL
Chol/HDL Ratio: 2 ratio (ref 0.0–5.0)
Cholesterol, Total: 171 mg/dL (ref 100–199)
HDL: 87 mg/dL (ref >39)
LDL Chol Calc (NIH): 75 mg/dL (ref 0–99)
Triglycerides: 41 mg/dL (ref 0–149)
VLDL Cholesterol Cal: 9 mg/dL (ref 5–40)

## 2024-03-04 ENCOUNTER — Other Ambulatory Visit: Payer: Self-pay | Admitting: Family Medicine

## 2024-03-04 DIAGNOSIS — E118 Type 2 diabetes mellitus with unspecified complications: Secondary | ICD-10-CM

## 2024-03-13 ENCOUNTER — Other Ambulatory Visit: Payer: Self-pay | Admitting: Family Medicine

## 2024-03-13 DIAGNOSIS — E1169 Type 2 diabetes mellitus with other specified complication: Secondary | ICD-10-CM

## 2024-03-19 ENCOUNTER — Telehealth: Payer: Self-pay

## 2024-03-19 DIAGNOSIS — E118 Type 2 diabetes mellitus with unspecified complications: Secondary | ICD-10-CM

## 2024-03-19 NOTE — Progress Notes (Signed)
   03/19/2024 Name: Scott Parks MRN: [id number] DOB: October 31, 1940  Chief Complaint  Patient presents with   Medication Management   Diabetes    Scott Parks is a 83 y.o. year old male who presented for a telephone visit.   They were referred to the pharmacist for assistance in managing diabetes.    Subjective:  Care Team: Primary Care Provider: Joyce Norleen BROCKS, MD ; Next Scheduled Visit: 07/03/24  Medication Access/Adherence  Current Pharmacy:  [facility] - [address] [place] - [address] [place] [address] Phone: [phone] Fax: [phone]  [facility] [address] [place] - [address] [place] [address] Phone: [phone] Fax: [phone]   Patient reports affordability concerns with their medications: No  Patient reports access/transportation concerns to their pharmacy: No  Patient reports adherence concerns with their medications:  No     Diabetes:  Current medications: Jardiance  25mg , Metformin  1000mg  BID, Ozempic  0.25mg  Medications tried in the past: Farxiga , Januvia   Current glucose readings:  No specific numbers to report, seeing improvement, being more mindful of what he eats since starting Ozempic   Injects Ozempic  on Saturdays, due for 4th dose of 0.25mg  on 03/23/24. Reports he had severe nausea/diarrhea for first 3 days after first dose but since then symptoms have resolved other than some mild nausea here and there. Reports he lost 14 pounds after first dose but has put 4 back on.  Recognizes that poor diet choices can worsen sugars and ozempic  side effects.  Going on a cruise in 2 week and prefers to stay on 0.25mg  until he returns, especially given harsh side effects at first dose.   Patient denies hypoglycemic s/sx including dizziness, shakiness, sweating. Patient denies hyperglycemic symptoms including polyuria, polydipsia, polyphagia, nocturia,  neuropathy, blurred vision.  Current meal patterns:  Has worked on eating better and is staying mindful of carbs since last PCP visit  Current physical activity: Goes to gym regularly and is active on a daily basis  Current medication access support: None   Objective:  Lab Results  Component Value Date   HGBA1C 8.2 (A) 02/29/2024    Lab Results  Component Value Date   CREATININE 1.07 02/29/2024   BUN 23 02/29/2024   NA 135 02/29/2024   K 4.7 02/29/2024   CL 97 02/29/2024   CO2 22 02/29/2024    Lab Results  Component Value Date   CHOL 171 02/29/2024   HDL 87 02/29/2024   LDLCALC 75 02/29/2024   TRIG 41 02/29/2024   CHOLHDL 2.0 02/29/2024    Medications Reviewed Today   Medications were not reviewed in this encounter       Assessment/Plan:   Diabetes: - Currently uncontrolled at last A1C check - Reviewed long term cardiovascular and renal outcomes of uncontrolled blood sugar - Reviewed goal A1c, goal fasting, and goal 2 hour post prandial glucose - Reviewed dietary modifications including low carb diet - Reviewed lifestyle modifications including: continued exercise - Recommend to continue current medication therapy  - Patient denies personal or family history of multiple endocrine neoplasia type 2, medullary thyroid  cancer; personal history of pancreatitis or gallbladder disease. - Recommend to check glucose once daily at varying times -Continue Ozempic  0.25mg  once weekly, aware that pen at that dose has 6 total doses.   Follow Up Plan: 04/11/24  Jon VEAR Lindau, PharmD Clinical Pharmacist [phone]

## 2024-03-25 ENCOUNTER — Telehealth: Payer: Self-pay | Admitting: Internal Medicine

## 2024-03-25 NOTE — Telephone Encounter (Signed)
 Patient was identified as falling into the [place] Measure - Diabetes.   Patient was: Referred to pharmacy for chronic disease management. Jon Lindau is working on this to follow-up with Diabetes care

## 2024-04-08 ENCOUNTER — Other Ambulatory Visit: Payer: Self-pay | Admitting: Family Medicine

## 2024-04-08 DIAGNOSIS — E118 Type 2 diabetes mellitus with unspecified complications: Secondary | ICD-10-CM

## 2024-04-11 ENCOUNTER — Other Ambulatory Visit (INDEPENDENT_AMBULATORY_CARE_PROVIDER_SITE_OTHER)

## 2024-04-11 DIAGNOSIS — E118 Type 2 diabetes mellitus with unspecified complications: Secondary | ICD-10-CM

## 2024-04-11 NOTE — Progress Notes (Signed)
   04/11/2024 Name: Scott Parks MRN: [id number] DOB: [birth year]  Chief Complaint  Patient presents with   Medication Management   Diabetes    Scott Parks is a 83 y.o. year old male who presented for a telephone visit.   They were referred to the pharmacist for assistance in managing diabetes.    Subjective:  Care Team: Primary Care Provider: Joyce Norleen BROCKS, MD ; Next Scheduled Visit: 07/03/24  Medication Access/Adherence  Current Pharmacy:  [facility] DELIVERY - [address], [place] - [address] [place] [address] Phone: [phone] Fax: [phone]  [facility] [address] [place] - [address] [place] [address] Phone: [phone] Fax: [phone]   Patient reports affordability concerns with their medications: No  Patient reports access/transportation concerns to their pharmacy: No  Patient reports adherence concerns with their medications:  No     Diabetes:  Current medications: Jardiance  25mg , Metformin  1000mg  BID, Ozempic  0.25mg  Medications tried in the past: Farxiga , Januvia   Current glucose readings:  Checking once daily at varying times. Reports he had one fasting sugar at 181 but denies any other readings above 130. Reports all post-prandial readings under 180, usually 140-150  Injects Ozempic  on Saturdays. Has given himself 6 doses, prefers to stay at 0.25mg  dose for another 4-6 weeks as he feels his sugars are responding well at that does  Recognizes that poor diet choices can worsen sugars and ozempic  side effects.    Patient denies hypoglycemic s/sx including dizziness, shakiness, sweating. Patient denies hyperglycemic symptoms including polyuria, polydipsia, polyphagia, nocturia, neuropathy, blurred vision.  Current meal patterns:  Has worked on eating better and is staying mindful of carbs since last PCP visit  Current physical activity: Goes to gym  regularly and is active on a daily basis  Current medication access support: None   Objective:  Lab Results  Component Value Date   HGBA1C 8.2 (A) 02/29/2024    Lab Results  Component Value Date   CREATININE 1.07 02/29/2024   BUN 23 02/29/2024   NA 135 02/29/2024   K 4.7 02/29/2024   CL 97 02/29/2024   CO2 22 02/29/2024    Lab Results  Component Value Date   CHOL 171 02/29/2024   HDL 87 02/29/2024   LDLCALC 75 02/29/2024   TRIG 41 02/29/2024   CHOLHDL 2.0 02/29/2024    Medications Reviewed Today   Medications were not reviewed in this encounter       Assessment/Plan:   Diabetes: - Currently uncontrolled at last A1C check but improving - Reviewed long term cardiovascular and renal outcomes of uncontrolled blood sugar - Reviewed goal A1c, goal fasting, and goal 2 hour post prandial glucose - Reviewed dietary modifications including low carb diet - Reviewed lifestyle modifications including: continued exercise - Recommend to continue current medication therapy  - Patient denies personal or family history of multiple endocrine neoplasia type 2, medullary thyroid  cancer; personal history of pancreatitis or gallbladder disease. - Recommend to check glucose once daily at varying times -Continue Ozempic  0.25mg  once weekly, aware that pen at that dose has 6 total doses. Reminded patient he has a refill on file at [facility]. Recommend increasing to 0.5mg  at next appt if patient continues to tolerate well   Follow Up Plan: 05/23/24  Jon VEAR Lindau, PharmD Clinical Pharmacist [phone]

## 2024-04-12 ENCOUNTER — Other Ambulatory Visit: Payer: Self-pay | Admitting: Family Medicine

## 2024-04-12 DIAGNOSIS — E118 Type 2 diabetes mellitus with unspecified complications: Secondary | ICD-10-CM

## 2024-04-12 MED ORDER — OZEMPIC (0.25 OR 0.5 MG/DOSE) 2 MG/3ML ~~LOC~~ SOPN: 0.2500 mg | PEN_INJECTOR | SUBCUTANEOUS | 1 refills | Status: DC

## 2024-04-12 NOTE — Telephone Encounter (Signed)
 Copied from CRM [id number]. Topic: Clinical - Medication Refill >> Apr 12, 2024  2:19 PM Debby BROCKS wrote: Medication: Semaglutide ,0.25 or 0.5MG /DOS, (OZEMPIC , 0.25 OR 0.5 MG/DOSE,) 2 MG/3ML SOPN  Has the patient contacted their pharmacy? Yes (Agent: If no, request that the patient contact the pharmacy for the refill. If patient does not wish to contact the pharmacy document the reason why and proceed with request.) (Agent: If yes, when and what did the pharmacy advise?) To contact PCP   This is the patient's preferred pharmacy:  [facility] [address] [place] - [address] [place] [address] Phone: [phone] Fax: [phone]  Is this the correct pharmacy for this prescription? Yes If no, delete pharmacy and type the correct one.   Has the prescription been filled recently? No  Is the patient out of the medication? Yes  Has the patient been seen for an appointment in the last year OR does the patient have an upcoming appointment? Yes  Can we respond through MyChart? No  Agent: Please be advised that Rx refills may take up to 3 business days. We ask that you follow-up with your pharmacy.

## 2024-04-30 ENCOUNTER — Telehealth: Payer: Self-pay | Admitting: Pharmacy Technician

## 2024-04-30 NOTE — Progress Notes (Signed)
 Patient engaged with clinical pharmacist for management of diabetes on 03/16/2024. Outreach by [employer] technician was requested.   Outreached patient to discuss diabetes medication management. Left voicemail for patient to return my call at their convenience.   Marlia Schewe, CPhT [facility] Office: [phone] Email: [email or web address]

## 2024-05-01 ENCOUNTER — Telehealth: Payer: Self-pay | Admitting: Pharmacy Technician

## 2024-05-01 NOTE — Progress Notes (Signed)
   05/01/2024 Name: Scott Parks MRN: [id number] DOB: Jul 17, 1941  Patient is appearing on a report for [employer] Metric Diabetes and last engaged with the clinical pharmacist to discuss diabetes on 04/11/2024. Contacted patient today to discuss diabetes management and completed medication review.   Diabetes Plan from last clinical pharmacist appointment:  Diabetes: - Currently uncontrolled at last A1C check but improving - Reviewed long term cardiovascular and renal outcomes of uncontrolled blood sugar - Reviewed goal A1c, goal fasting, and goal 2 hour post prandial glucose - Reviewed dietary modifications including low carb diet - Reviewed lifestyle modifications including: continued exercise - Recommend to continue current medication therapy  - Patient denies personal or family history of multiple endocrine neoplasia type 2, medullary thyroid  cancer; personal history of pancreatitis or gallbladder disease. - Recommend to check glucose once daily at varying times -Continue Ozempic  0.25mg  once weekly, aware that pen at that dose has 6 total doses. Reminded patient he has a refill on file at [facility]. Recommend increasing to 0.5mg  at next appt if patient continues to tolerate well  Follow Up Plan: 05/23/24   Medication Adherence Barriers Identified:  Patient made recommended medication changes per plan: Yes Patient inofrms he takes Metformin  1000nmg twice a day, Jardiance  25mg  daily and Ozempic  0.25mg  weekly. He endorses adherence to these medications and he has them on hand to take. Access issues with any new medication or testing device: No He reports no affordability concerns. Per Dr Annemarie fill history data, [name] filed on 03/04/24 for 90 day supply, Metformin  on 8/25 for 90 day supply and Ozempic  on 9/2 for 56 day supply (3ml). Patient is checking blood sugars as prescribed: Yes Patient is doing fingerstick blood sugar checks. He informs since last office visit with PharmD on 8/28, his  blood sugars have been 130,128,111,118,121,132,139,95,135. Patient reports things are going well. On 02/29/24, his POCT Glycosylated hemoglobin (Hb A1C) was 8.2 Patient informs his only concern is that he is losing too much weight. He informs he is 5'8 tall and is 142lbs. He informs he does not like being this small. And is worried about losing more since he is on Ozempic .  Medication Adherence Barriers Addressed/Actions Taken:  Reviewed medication changes per plan from last clinical pharmacist note Educated patient to contact pharmacy regarding new prescriptions/refills Reviewed instructions for monitoring blood sugars at home and reminded patient to keep a written log to review with pharmacist Reminded patient of date/time of upcoming clinical pharmacist follow up and any upcoming PCP/specialists visits. Patient denies transportation barriers to the appointment. Yes  Next clinical pharmacist appointment is scheduled for: 05/23/2024  Kate Caddy, CPhT [facility] Office: [phone] Email: [email or web address]

## 2024-05-09 DIAGNOSIS — Z23 Encounter for immunization: Secondary | ICD-10-CM | POA: Diagnosis not present

## 2024-05-10 ENCOUNTER — Telehealth: Payer: Self-pay | Admitting: Family Medicine

## 2024-05-10 NOTE — Telephone Encounter (Signed)
 Patient was identified as falling into the [employer] [place] Measure - Diabetes.   Patient was: Referred to pharmacy for chronic disease management. Appt PCP 07/03/25

## 2024-05-23 ENCOUNTER — Other Ambulatory Visit (INDEPENDENT_AMBULATORY_CARE_PROVIDER_SITE_OTHER)

## 2024-05-23 DIAGNOSIS — E118 Type 2 diabetes mellitus with unspecified complications: Secondary | ICD-10-CM

## 2024-05-23 NOTE — Progress Notes (Signed)
 05/23/2024 Name: Scott Parks MRN: [id number] DOB: [birth year]  Chief Complaint  Patient presents with   Medication Management   Diabetes    Scott Parks is a 83 y.o. year old male who presented for a telephone visit.   They were referred to the pharmacist for assistance in managing diabetes.    Subjective:  Care Team: Primary Care Provider: Joyce Norleen BROCKS, MD ; Next Scheduled Visit: 07/03/24  Medication Access/Adherence  Current Pharmacy:  [facility] DELIVERY - [address], [place] - [address] [place] [address] Phone: [phone] Fax: [phone]  [facility] [address] [place] - [address] [place] [address] Phone: [phone] Fax: [phone]   Patient reports affordability concerns with their medications: No  Patient reports access/transportation concerns to their pharmacy: No  Patient reports adherence concerns with their medications:  No     Diabetes:  Current medications: Jardiance  25mg , Metformin  1000mg  BID, Ozempic  0.25mg  Medications tried in the past: Farxiga , Januvia   Current glucose readings:  Checking at least once daily, sometimes twice daily. Denies any fasting readings above 130 and reports highest BG reading he has seen since starting Ozempic  is 163 (post-prandial)  Injects Ozempic  on Sundays.   Recognizes that poor diet choices can worsen sugars and ozempic  side effects.. Reports he lost quite a bit of weight when he started taking Ozempic  due to severe N/V but this resolved prior to the second dose. Down to 140lbs per home scale but has not lost anymore weight since that first week.  Patient insists that he wants to stay on Ozempic  at 0.25mg  and feels his sugars are doing well on it. Does not want to go up due to fear of more weight loss.    Patient denies hypoglycemic s/sx including dizziness, shakiness, sweating. Patient denies hyperglycemic  symptoms including polyuria, polydipsia, polyphagia, nocturia, neuropathy, blurred vision.  Current meal patterns:  Has worked on eating better and is staying mindful of carbs since last PCP visit  Current physical activity: Goes to gym regularly and is active on a daily basis  Current medication access support: None   Objective:  Lab Results  Component Value Date   HGBA1C 8.2 (A) 02/29/2024    Lab Results  Component Value Date   CREATININE 1.07 02/29/2024   BUN 23 02/29/2024   NA 135 02/29/2024   K 4.7 02/29/2024   CL 97 02/29/2024   CO2 22 02/29/2024    Lab Results  Component Value Date   CHOL 171 02/29/2024   HDL 87 02/29/2024   LDLCALC 75 02/29/2024   TRIG 41 02/29/2024   CHOLHDL 2.0 02/29/2024    Medications Reviewed Today     Reviewed by Lionell Jon DEL, RPH (Pharmacist) on 05/23/24 at 1836  Med List Status: <None>   Medication Order Taking? Sig Documenting Provider Last Dose Status Informant  atorvastatin  (LIPITOR) 40 MG tablet [id number] Yes TAKE 1 TABLET DAILY Lalonde, John C, MD  Active   Ferrous Sulfate (IRON  PO) [id number] Yes Take by mouth. [provider]  Active   glucose blood [facility] VERIO) test strip [id number]  Use as instructed Joyce Norleen BROCKS, MD  Active Self  JARDIANCE  25 MG TABS tablet [id number] Yes TAKE 1 TABLET DAILY Lalonde, John C, MD  Active   lisinopril  (ZESTRIL ) 20 MG tablet [id number] Yes Take 1 tablet (20 mg total) by mouth daily. Joyce Norleen BROCKS, MD  Active   metFORMIN  (GLUCOPHAGE ) 1000 MG tablet [id number]  Yes TAKE 1 TABLET TWICE A DAY WITH MEALS Lalonde, John C, MD  Active   Pediatric Multivitamins-Iron  (MULTI-DELYN/IRON  PO) [id number]  Take 2 tablets by mouth daily.  [provider]  Active Self  Semaglutide ,0.25 or 0.5MG /DOS, (OZEMPIC , 0.25 OR 0.5 MG/DOSE,) 2 MG/3ML SOPN [id number] Yes Inject 0.25 mg into the skin once a week. Joyce Norleen BROCKS, MD  Active   vitamin C  (ASCORBIC ACID ) 500 MG tablet [id number] Yes Take  1,000 mg by mouth daily. [provider]  Active Self              Assessment/Plan:   Diabetes: - Currently uncontrolled at last A1C check but improving - Reviewed long term cardiovascular and renal outcomes of uncontrolled blood sugar - Reviewed goal A1c, goal fasting, and goal 2 hour post prandial glucose - Reviewed dietary modifications including low carb diet - Reviewed lifestyle modifications including: continued exercise - Recommend to continue current medication therapy  - Patient denies personal or family history of multiple endocrine neoplasia type 2, medullary thyroid  cancer; personal history of pancreatitis or gallbladder disease. - Recommend to check glucose once daily at varying times -Continue Ozempic  0.25mg  once weekly, aware pen has 8 doses. Reminded of PCP visit next month  Follow Up Plan: 07/25/24  Jon VEAR Lindau, PharmD Clinical Pharmacist [phone]

## 2024-05-27 ENCOUNTER — Other Ambulatory Visit: Payer: Self-pay | Admitting: Family Medicine

## 2024-05-27 DIAGNOSIS — I152 Hypertension secondary to endocrine disorders: Secondary | ICD-10-CM

## 2024-05-30 ENCOUNTER — Telehealth: Payer: Self-pay

## 2024-05-30 NOTE — Progress Notes (Signed)
   05/30/2024  Patient ID: Scott Parks, male   DOB: 10-06-1940, 83 y.o.   MRN: [id number]  Contacted patient to notify that the sample box of pen needles is ready for pick up at the front office at his convenience.  Jon VEAR Lindau, PharmD Clinical Pharmacist [phone]

## 2024-07-03 ENCOUNTER — Ambulatory Visit: Admitting: Family Medicine

## 2024-07-03 ENCOUNTER — Encounter: Payer: Self-pay | Admitting: Family Medicine

## 2024-07-03 VITALS — BP 118/80 | HR 75 | Wt 140.6 lb

## 2024-07-03 DIAGNOSIS — E118 Type 2 diabetes mellitus with unspecified complications: Secondary | ICD-10-CM | POA: Diagnosis not present

## 2024-07-03 LAB — POCT GLYCOSYLATED HEMOGLOBIN (HGB A1C): Hemoglobin A1C: 6.9 % — AB (ref 4.0–5.6)

## 2024-07-03 NOTE — Progress Notes (Signed)
   Name: Scott Parks   Date of Visit: 07/03/24   Date of last visit with me: Visit date not found   CHIEF COMPLAINT:  Chief Complaint  Patient presents with   Follow-up    4 month follow up. Concerns about losing 20 pounds, more fatigue.        HPI:  Discussed the use of AI scribe software for clinical note transcription with the patient, who gave verbal consent to proceed.  History of Present Illness   Scott Parks is an 83 year old male with diabetes who presents with concerns about weight loss and decreased energy.  He has experienced a weight loss of approximately 20 pounds, which he attributes to his current medication regimen, specifically Ozempic  at a dose of 0.25 mg. He often has to make himself eat.  He describes a significant decrease in energy levels, which he associates with his weight loss. He stopped going to the gym about three months ago due to lack of energy.  When he first started Ozempic , he experienced significant side effects, including averaging about three hours of sleep per day and frequent bathroom visits. These symptoms resolved as his body adjusted to the medication.  He is currently taking vitamin D supplements. He has a family history of relatives not living past their sixties, with only one man in his family having lived longer than he has.         OBJECTIVE:       02/29/2024   10:46 AM  Depression screen PHQ 2/9  Decreased Interest 0  Down, Depressed, Hopeless 0  PHQ - 2 Score 0     BP Readings from Last 3 Encounters:  07/03/24 118/80  02/29/24 118/80  07/18/23 120/76    BP 118/80   Pulse 75   Wt 140 lb 9.6 oz (63.8 kg)   SpO2 99%   BMI 21.38 kg/m    Physical Exam          Physical Exam Constitutional:      Appearance: Normal appearance.  Neurological:     General: No focal deficit present.     Mental Status: He is alert and oriented to person, place, and time. Mental status is at baseline.     ASSESSMENT/PLAN:    Assessment & Plan Type 2 diabetes mellitus with complication, without long-term current use of insulin  (HCC)    Assessment and Plan    Type 2 diabetes mellitus Well-controlled with A1c of 6.9. Weight loss and fatigue likely due to Ozempic  and age-related muscle loss. Initial Ozempic  side effects resolved. - Continue Ozempic  at 0.25 mg for three months. - Encouraged weight training twice weekly. - Recommended creatine monohydrate supplementation. - Continue vitamin D supplementation. - Recommended magnesium glycinate and fish oil supplements.  Sarcopenia Contributing to fatigue and decreased energy due to muscle mass loss and reduced activity. - Encouraged weight training twice weekly. - Recommended creatine monohydrate supplementation. - Continue vitamin D supplementation. - Recommended magnesium glycinate and fish oil supplements.         Arsal Tappan A. Vita MD [facility]

## 2024-07-03 NOTE — Patient Instructions (Addendum)
 Please try these over the counter supplements  Creatine Monophosphate  Vitamin D  Magnesium Glycinate   Fish Oil

## 2024-07-16 ENCOUNTER — Other Ambulatory Visit: Payer: Self-pay

## 2024-07-16 DIAGNOSIS — E118 Type 2 diabetes mellitus with unspecified complications: Secondary | ICD-10-CM

## 2024-07-16 MED ORDER — OZEMPIC (0.25 OR 0.5 MG/DOSE) 2 MG/3ML ~~LOC~~ SOPN: 0.2500 mg | PEN_INJECTOR | SUBCUTANEOUS | 1 refills | Status: AC

## 2024-07-16 NOTE — Progress Notes (Signed)
 07/16/2024 Name: Scott Parks MRN: [id number] DOB: 09-25-1940  Chief Complaint  Patient presents with   Medication Management   Diabetes    Scott Parks is a 83 y.o. year old male who presented for a telephone visit.   They were referred to the pharmacist for assistance in managing diabetes.    Subjective:  Care Team: Primary Care Provider: Vita Morrow, MD   Medication Access/Adherence  Current Pharmacy:  [facility] DELIVERY - [address], [place] - [address] [place] [address] Phone: [phone] Fax: [phone]  [facility] [address], [place] - [address] [place] [address] Phone: [phone] Fax: [phone]   Patient reports affordability concerns with their medications: No  Patient reports access/transportation concerns to their pharmacy: No  Patient reports adherence concerns with their medications:  No     Diabetes:  Current medications: Jardiance  25mg , Metformin  1000mg  BID, Ozempic  0.25mg  Medications tried in the past: Farxiga , Januvia   Current glucose readings:  Checking at least once daily, sometimes twice daily. Denies any fasting readings above 130 and reports highest BG reading he has seen since starting Ozempic  is 163 (post-prandial)  Injects Ozempic  on Sundays.   Recognizes that poor diet choices can worsen sugars and ozempic  side effects.. Reports he lost quite a bit of weight when he started taking Ozempic  due to severe N/V but this resolved prior to the second dose. Down to 140lbs per home scale but has not lost anymore weight since that first week.  Patient insists that he wants to stay on Ozempic  at 0.25mg  and feels his sugars are doing well on it. Does not want to go up due to fear of more weight loss.  Has started all OTC supplements recommended by PCP at last visit to assist with maintaining and rebuilding some weight loss previously   Patient  denies hypoglycemic s/sx including dizziness, shakiness, sweating. Patient denies hyperglycemic symptoms including polyuria, polydipsia, polyphagia, nocturia, neuropathy, blurred vision.  Current meal patterns:  Has worked on eating better and is staying mindful of carbs since last PCP visit  Current physical activity: Goes to gym regularly and is active on a daily basis  Current medication access support: None   Objective:  Lab Results  Component Value Date   HGBA1C 6.9 (A) 07/03/2024    Lab Results  Component Value Date   CREATININE 1.07 02/29/2024   BUN 23 02/29/2024   NA 135 02/29/2024   K 4.7 02/29/2024   CL 97 02/29/2024   CO2 22 02/29/2024    Lab Results  Component Value Date   CHOL 171 02/29/2024   HDL 87 02/29/2024   LDLCALC 75 02/29/2024   TRIG 41 02/29/2024   CHOLHDL 2.0 02/29/2024    Medications Reviewed Today     Reviewed by Lionell Jon DEL, RPH (Pharmacist) on 07/16/24 at 1307  Med List Status: <None>   Medication Order Taking? Sig Documenting Provider Last Dose Status Informant  atorvastatin  (LIPITOR) 40 MG tablet [id number]  TAKE 1 TABLET DAILY Lalonde, John C, MD  Active   Ferrous Sulfate (IRON  PO) [id number]  Take by mouth. [provider]  Active   glucose blood (ONETOUCH VERIO) test strip [id number]  Use as instructed Joyce Norleen BROCKS, MD  Active Self  JARDIANCE  25 MG TABS tablet [id number]  TAKE 1 TABLET DAILY Lalonde, John C, MD  Active   lisinopril  (ZESTRIL ) 20 MG tablet [id number]  TAKE 1 TABLET DAILY Lalonde, John C,  MD  Active   metFORMIN  (GLUCOPHAGE ) 1000 MG tablet [id number]  TAKE 1 TABLET TWICE A DAY WITH MEALS Lalonde, John C, MD  Active   Pediatric Multivitamins-Iron  (MULTI-DELYN/IRON  PO) [id number]  Take 2 tablets by mouth daily.  [provider]  Active Self  Semaglutide ,0.25 or 0.5MG /DOS, (OZEMPIC , 0.25 OR 0.5 MG/DOSE,) 2 MG/3ML SOPN [id number]  Inject 0.25 mg into the skin once a week. Joyce Norleen BROCKS, MD  Active   vitamin  C (ASCORBIC ACID ) 500 MG tablet [id number]  Take 1,000 mg by mouth daily. [provider]  Active Self              Assessment/Plan:   Diabetes: - Currently controlled - Reviewed long term cardiovascular and renal outcomes of uncontrolled blood sugar - Reviewed goal A1c, goal fasting, and goal 2 hour post prandial glucose - Reviewed dietary modifications including low carb diet - Reviewed lifestyle modifications including: continued exercise - Recommend to continue current medication therapy  - Patient denies personal or family history of multiple endocrine neoplasia type 2, medullary thyroid  cancer; personal history of pancreatitis or gallbladder disease. - Recommend to check glucose once daily at varying times -Continue Ozempic  0.25mg  once weekly as per PCP visit on 07/03/24, will coordinate refills  Follow Up Plan: Patient to reach out as needed, keep f/u with PCP  Jon VEAR Lindau, PharmD Clinical Pharmacist [phone]

## 2024-07-25 ENCOUNTER — Other Ambulatory Visit

## 2024-08-23 ENCOUNTER — Other Ambulatory Visit: Payer: Self-pay | Admitting: Family Medicine

## 2024-08-23 DIAGNOSIS — I152 Hypertension secondary to endocrine disorders: Secondary | ICD-10-CM

## 2025-02-04 ENCOUNTER — Ambulatory Visit: Payer: Self-pay

## 2025-03-03 ENCOUNTER — Ambulatory Visit: Payer: Self-pay | Admitting: Family Medicine
